# Patient Record
Sex: Male | Born: 1980
Health system: Southern US, Community
[De-identification: ages and names within clinical notes are randomized; demographics above are authoritative.]

## PROBLEM LIST (undated history)

## (undated) DIAGNOSIS — R569 Unspecified convulsions: Secondary | ICD-10-CM

## (undated) DIAGNOSIS — J45909 Unspecified asthma, uncomplicated: Secondary | ICD-10-CM

## (undated) DIAGNOSIS — F1920 Other psychoactive substance dependence, uncomplicated: Secondary | ICD-10-CM

## (undated) DIAGNOSIS — B1920 Unspecified viral hepatitis C without hepatic coma: Secondary | ICD-10-CM

## (undated) DIAGNOSIS — Z8619 Personal history of other infectious and parasitic diseases: Secondary | ICD-10-CM

## (undated) DIAGNOSIS — F32A Depression, unspecified: Secondary | ICD-10-CM

## (undated) DIAGNOSIS — F329 Major depressive disorder, single episode, unspecified: Secondary | ICD-10-CM

## (undated) DIAGNOSIS — I639 Cerebral infarction, unspecified: Secondary | ICD-10-CM

## (undated) DIAGNOSIS — F419 Anxiety disorder, unspecified: Secondary | ICD-10-CM

## (undated) HISTORY — PX: UMBILICAL HERNIA REPAIR: SHX196

## (undated) HISTORY — DX: Cerebral infarction, unspecified: I63.9

## (undated) HISTORY — DX: Personal history of other infectious and parasitic diseases: Z86.19

## (undated) HISTORY — PX: ABDOMINAL SURGERY: SHX537

## (undated) HISTORY — DX: Unspecified viral hepatitis C without hepatic coma: B19.20

## (undated) HISTORY — DX: Unspecified convulsions: R56.9

---

## 2013-06-07 ENCOUNTER — Emergency Department (HOSPITAL_BASED_OUTPATIENT_CLINIC_OR_DEPARTMENT_OTHER): Payer: Self-pay

## 2013-06-07 ENCOUNTER — Emergency Department (HOSPITAL_BASED_OUTPATIENT_CLINIC_OR_DEPARTMENT_OTHER)
Admission: EM | Admit: 2013-06-07 | Discharge: 2013-06-07 | Disposition: A | Discharge status: Home / Self Care | Payer: Self-pay | Attending: Emergency Medicine | Admitting: Emergency Medicine

## 2013-06-07 ENCOUNTER — Encounter (HOSPITAL_BASED_OUTPATIENT_CLINIC_OR_DEPARTMENT_OTHER): Payer: Self-pay | Admitting: *Deleted

## 2013-06-07 DIAGNOSIS — X500XXA Overexertion from strenuous movement or load, initial encounter: Secondary | ICD-10-CM | POA: Insufficient documentation

## 2013-06-07 DIAGNOSIS — S8991XA Unspecified injury of right lower leg, initial encounter: Secondary | ICD-10-CM

## 2013-06-07 DIAGNOSIS — Y9368 Activity, volleyball (beach) (court): Secondary | ICD-10-CM | POA: Insufficient documentation

## 2013-06-07 DIAGNOSIS — F17200 Nicotine dependence, unspecified, uncomplicated: Secondary | ICD-10-CM | POA: Insufficient documentation

## 2013-06-07 DIAGNOSIS — Y9239 Other specified sports and athletic area as the place of occurrence of the external cause: Secondary | ICD-10-CM | POA: Insufficient documentation

## 2013-06-07 DIAGNOSIS — S8990XA Unspecified injury of unspecified lower leg, initial encounter: Secondary | ICD-10-CM | POA: Insufficient documentation

## 2013-06-07 HISTORY — DX: Other psychoactive substance dependence, uncomplicated: F19.20

## 2013-06-07 NOTE — ED Notes (Signed)
Patient is from [facility] and is not to have any narcotics

## 2013-06-07 NOTE — ED Notes (Signed)
Patient was playing volley ball and and now his right knee is hurting.  Patient is able to bend his right knee.

## 2013-06-07 NOTE — ED Provider Notes (Signed)
CSN: [id number]     Arrival date & time 06/07/13  2144 History  This   Chief Complaint  Patient presents with  . Knee Pain    Patient is a 32 y.o. male presenting with knee pain. The history is provided by the patient. No language interpreter was used.  Knee Pain  HPI Comments: John [name] is a 32 y.o. male who presents to the Emergency Department complaining of right knee pain onset today after playing volleyball at [facility].  He twisted his right knee and has had pain with weight bearing and movement.  No other injury.    Past Medical History  Diagnosis Date  . Drug addiction    History reviewed. No pertinent past surgical history. History reviewed. No pertinent family history. History  Substance Use Topics  . Smoking status: Current Every Day Smoker    Types: Cigarettes  . Smokeless tobacco: Not on file  . Alcohol Use: No    Review of Systems  Musculoskeletal: Positive for arthralgias (right knee).  All other systems reviewed and are negative.    Allergies  Risperdal  Home Medications  No current outpatient prescriptions on file.  Triage Vitals: BP 137/74  Pulse 96  Temp(Src) 98.3 F (36.8 C) (Oral)  Resp 18  Ht 6' (1.829 m)  Wt 215 lb (97.523 kg)  BMI 29.15 kg/m2  SpO2 98%  Physical Exam  Nursing note and vitals reviewed. Constitutional: He is oriented to person, place, and time. He appears well-developed and well-nourished. No distress.  HENT:  Head: Normocephalic and atraumatic.  Eyes: EOM are normal.  Neck: Neck supple. No tracheal deviation present.  Cardiovascular: Normal rate.   Pulmonary/Chest: Effort normal. No respiratory distress.  Musculoskeletal: Normal range of motion.       Right knee: Normal. He exhibits normal range of motion, no swelling, no effusion, no deformity, no LCL laxity and no MCL laxity. No MCL, no LCL and no patellar tendon tenderness noted.  Neurological: He is alert and oriented to person, place, and time. He has normal  reflexes.  Skin: Skin is warm and dry.  Psychiatric: He has a normal mood and affect. His behavior is normal.    ED Course  Procedures (including critical care time)  DIAGNOSTIC STUDIES: Oxygen Saturation is 98% on RA, normal by my interpretation.    COORDINATION OF CARE: 10:21 PM-Discussed treatment plan which includes right knee xray with pt at bedside and pt agreed to plan.   Labs Review Labs Reviewed - No data to display  Imaging Review No results found.  MDM  No diagnosis found. No results found for this or any previous visit. Dg Knee Complete 4 Views Right  06/07/2013   CLINICAL DATA:  Right knee pain. Injury.  EXAM: RIGHT KNEE - COMPLETE 4+ VIEW  COMPARISON:  None.  FINDINGS: There is no evidence of fracture, dislocation, or joint effusion. There is no evidence of arthropathy or other focal bone abnormality. Soft tissues are unremarkable.  IMPRESSION: Negative.   Electronically Signed   By: Drusilla Kanner M.D.   On: 06/07/2013 23:01    John Quarry, MD 06/07/13 2312

## 2013-07-02 ENCOUNTER — Encounter (HOSPITAL_BASED_OUTPATIENT_CLINIC_OR_DEPARTMENT_OTHER): Payer: Self-pay | Admitting: Emergency Medicine

## 2013-07-02 ENCOUNTER — Emergency Department (HOSPITAL_BASED_OUTPATIENT_CLINIC_OR_DEPARTMENT_OTHER)
Admission: EM | Admit: 2013-07-02 | Discharge: 2013-07-02 | Disposition: A | Discharge status: Home / Self Care | Payer: Self-pay | Attending: Emergency Medicine | Admitting: Emergency Medicine

## 2013-07-02 DIAGNOSIS — Y9368 Activity, volleyball (beach) (court): Secondary | ICD-10-CM | POA: Insufficient documentation

## 2013-07-02 DIAGNOSIS — S0083XA Contusion of other part of head, initial encounter: Secondary | ICD-10-CM

## 2013-07-02 DIAGNOSIS — F17200 Nicotine dependence, unspecified, uncomplicated: Secondary | ICD-10-CM | POA: Insufficient documentation

## 2013-07-02 DIAGNOSIS — J45909 Unspecified asthma, uncomplicated: Secondary | ICD-10-CM | POA: Insufficient documentation

## 2013-07-02 DIAGNOSIS — Y9239 Other specified sports and athletic area as the place of occurrence of the external cause: Secondary | ICD-10-CM | POA: Insufficient documentation

## 2013-07-02 DIAGNOSIS — W1809XA Striking against other object with subsequent fall, initial encounter: Secondary | ICD-10-CM | POA: Insufficient documentation

## 2013-07-02 DIAGNOSIS — Z8659 Personal history of other mental and behavioral disorders: Secondary | ICD-10-CM | POA: Insufficient documentation

## 2013-07-02 DIAGNOSIS — Z79899 Other long term (current) drug therapy: Secondary | ICD-10-CM | POA: Insufficient documentation

## 2013-07-02 DIAGNOSIS — S0003XA Contusion of scalp, initial encounter: Secondary | ICD-10-CM | POA: Insufficient documentation

## 2013-07-02 HISTORY — DX: Unspecified asthma, uncomplicated: J45.909

## 2013-07-02 NOTE — ED Provider Notes (Signed)
CSN: [id number]     Arrival date & time 07/02/13  1646 History   First MD Initiated Contact with Patient 07/02/13 1730     Chief Complaint  Patient presents with  . Facial Pain   (Consider location/radiation/quality/duration/timing/severity/associated sxs/prior Treatment) HPI Pt who is a long time resident at [facility] reports he fell while playing volleyball hitting his L face on the sand. Denies LOC, complaining of some dizziness initially but improved since and now asymptomatic. Sent for evaluation.   Past Medical History  Diagnosis Date  . Drug addiction   . Asthma    History reviewed. No pertinent past surgical history. History reviewed. No pertinent family history. History  Substance Use Topics  . Smoking status: Current Every Day Smoker -- 2.00 packs/day    Types: Cigarettes  . Smokeless tobacco: Not on file  . Alcohol Use: No    Review of Systems All other systems reviewed and are negative except as noted in HPI.   Allergies  Risperdal  Home Medications   Current Outpatient Rx  Name  Route  Sig  Dispense  Refill  . DULoxetine (CYMBALTA) 60 MG capsule   Oral   Take 60 mg by mouth daily.         Marland Kitchen oxcarbazepine (TRILEPTAL) 600 MG tablet   Oral   Take 600 mg by mouth 2 (two) times daily.         . QUEtiapine (SEROQUEL) 400 MG tablet   Oral   Take 400 mg by mouth at bedtime.          BP 132/86  Temp(Src) 98.4 F (36.9 C) (Oral)  SpO2 96% Physical Exam  Nursing note and vitals reviewed. Constitutional: He is oriented to person, place, and time. He appears well-developed and well-nourished.  HENT:  Head: Normocephalic and atraumatic.  No outward signs of trauma, no swelling, ecchymosis or concern for facial fracture  Eyes: EOM are normal. Pupils are equal, round, and reactive to light.  No FB  Neck: Normal range of motion. Neck supple.  Cardiovascular: Normal rate, normal heart sounds and intact distal pulses.   Pulmonary/Chest: Effort normal and  breath sounds normal.  Abdominal: Bowel sounds are normal. He exhibits no distension. There is no tenderness.  Musculoskeletal: Normal range of motion. He exhibits no edema and no tenderness.  Neurological: He is alert and oriented to person, place, and time. He has normal strength. No cranial nerve deficit or sensory deficit.  Skin: Skin is warm and dry. No rash noted.  Psychiatric: He has a normal mood and affect.    ED Course  Procedures (including critical care time) Labs Review Labs Reviewed - No data to display Imaging Review No results found.  EKG Interpretation   None       MDM   1. Contusion of face, initial encounter    Normal exam, no concern for significant injury. Cleared to return to [facility]. Bernette Mayers, MD 07/02/13 1810

## 2013-07-02 NOTE — ED Notes (Signed)
Pt states that while playing vollyball he dove into the sand hitting the left side of his face. Pt reports being dizzy

## 2013-08-10 ENCOUNTER — Emergency Department: Payer: Self-pay

## 2013-08-10 LAB — CBC WITH DIFFERENTIAL/PLATELET
Basophil #: 0 10*3/uL (ref 0.0–0.1)
Eosinophil %: 2 %
HCT: 41.6 % (ref 40.0–52.0)
HGB: 14.5 g/dL (ref 13.0–18.0)
MCH: 32.8 pg (ref 26.0–34.0)
MCHC: 34.8 g/dL (ref 32.0–36.0)
MCV: 94 fL (ref 80–100)
Monocyte #: 0.6 x10 3/mm (ref 0.2–1.0)
Monocyte %: 6.9 %
Neutrophil #: 5 10*3/uL (ref 1.4–6.5)
Neutrophil %: 61.7 %
Platelet: 183 10*3/uL (ref 150–440)
RDW: 12.8 % (ref 11.5–14.5)

## 2013-08-10 LAB — COMPREHENSIVE METABOLIC PANEL
Albumin: 4.2 g/dL (ref 3.4–5.0)
Alkaline Phosphatase: 105 U/L
Anion Gap: 4 — ABNORMAL LOW (ref 7–16)
BUN: 11 mg/dL (ref 7–18)
Calcium, Total: 8.8 mg/dL (ref 8.5–10.1)
Chloride: 106 mmol/L (ref 98–107)
Co2: 27 mmol/L (ref 21–32)
Creatinine: 0.97 mg/dL (ref 0.60–1.30)
EGFR (African American): >60
EGFR (Non-African Amer.): >60
Potassium: 4 mmol/L (ref 3.5–5.1)

## 2013-08-10 LAB — CK TOTAL AND CKMB (NOT AT ARMC): CK-MB: 2.4 ng/mL (ref 0.5–3.6)

## 2013-10-13 DIAGNOSIS — B1920 Unspecified viral hepatitis C without hepatic coma: Secondary | ICD-10-CM | POA: Insufficient documentation

## 2014-01-14 ENCOUNTER — Emergency Department: Payer: Self-pay | Admitting: Internal Medicine

## 2014-08-06 ENCOUNTER — Emergency Department (HOSPITAL_COMMUNITY)
Admission: EM | Admit: 2014-08-06 | Discharge: 2014-08-06 | Disposition: A | Discharge status: Home / Self Care | Payer: Self-pay | Attending: Emergency Medicine | Admitting: Emergency Medicine

## 2014-08-06 ENCOUNTER — Encounter (HOSPITAL_COMMUNITY): Payer: Self-pay | Admitting: Emergency Medicine

## 2014-08-06 DIAGNOSIS — L03114 Cellulitis of left upper limb: Secondary | ICD-10-CM | POA: Insufficient documentation

## 2014-08-06 DIAGNOSIS — F329 Major depressive disorder, single episode, unspecified: Secondary | ICD-10-CM | POA: Insufficient documentation

## 2014-08-06 DIAGNOSIS — Z720 Tobacco use: Secondary | ICD-10-CM | POA: Insufficient documentation

## 2014-08-06 DIAGNOSIS — J45909 Unspecified asthma, uncomplicated: Secondary | ICD-10-CM | POA: Insufficient documentation

## 2014-08-06 DIAGNOSIS — F1990 Other psychoactive substance use, unspecified, uncomplicated: Secondary | ICD-10-CM

## 2014-08-06 DIAGNOSIS — Z79899 Other long term (current) drug therapy: Secondary | ICD-10-CM | POA: Insufficient documentation

## 2014-08-06 DIAGNOSIS — F1510 Other stimulant abuse, uncomplicated: Secondary | ICD-10-CM | POA: Insufficient documentation

## 2014-08-06 DIAGNOSIS — F1120 Opioid dependence, uncomplicated: Secondary | ICD-10-CM | POA: Insufficient documentation

## 2014-08-06 DIAGNOSIS — F11220 Opioid dependence with intoxication, uncomplicated: Secondary | ICD-10-CM

## 2014-08-06 HISTORY — DX: Depression, unspecified: F32.A

## 2014-08-06 HISTORY — DX: Major depressive disorder, single episode, unspecified: F32.9

## 2014-08-06 LAB — ETHANOL: Alcohol, Ethyl (B): <11 mg/dL (ref 0–11)

## 2014-08-06 LAB — CBC
HCT: 38.2 % — ABNORMAL LOW (ref 39.0–52.0)
HEMOGLOBIN: 13 g/dL (ref 13.0–17.0)
MCH: 30.6 pg (ref 26.0–34.0)
MCHC: 34 g/dL (ref 30.0–36.0)
MCV: 89.9 fL (ref 78.0–100.0)
PLATELETS: 274 10*3/uL (ref 150–400)
RBC: 4.25 MIL/uL (ref 4.22–5.81)
RDW: 12.4 % (ref 11.5–15.5)
WBC: 10.5 10*3/uL (ref 4.0–10.5)

## 2014-08-06 LAB — RAPID URINE DRUG SCREEN, HOSP PERFORMED
AMPHETAMINES: POSITIVE — AB
Barbiturates: NOT DETECTED
Benzodiazepines: NOT DETECTED
COCAINE: NOT DETECTED
Opiates: NOT DETECTED
TETRAHYDROCANNABINOL: NOT DETECTED

## 2014-08-06 LAB — COMPREHENSIVE METABOLIC PANEL
ALK PHOS: 79 U/L (ref 39–117)
ALT: 12 U/L (ref 0–53)
AST: 17 U/L (ref 0–37)
Albumin: 3.3 g/dL — ABNORMAL LOW (ref 3.5–5.2)
Anion gap: 16 — ABNORMAL HIGH (ref 5–15)
BUN: 11 mg/dL (ref 6–23)
CO2: 24 mEq/L (ref 19–32)
Calcium: 9 mg/dL (ref 8.4–10.5)
Chloride: 92 mEq/L — ABNORMAL LOW (ref 96–112)
Creatinine, Ser: 0.85 mg/dL (ref 0.50–1.35)
GFR calc non Af Amer: >90 mL/min (ref >90)
GLUCOSE: 120 mg/dL — AB (ref 70–99)
POTASSIUM: 3.8 meq/L (ref 3.7–5.3)
Sodium: 132 mEq/L — ABNORMAL LOW (ref 137–147)
TOTAL PROTEIN: 7.1 g/dL (ref 6.0–8.3)
Total Bilirubin: 0.3 mg/dL (ref 0.3–1.2)

## 2014-08-06 LAB — SALICYLATE LEVEL

## 2014-08-06 LAB — ACETAMINOPHEN LEVEL

## 2014-08-06 MED ORDER — ONDANSETRON 4 MG PO TBDP: 4.0000 mg | ORAL_TABLET | Freq: Four times a day (QID) | ORAL | Status: DC | PRN

## 2014-08-06 MED ORDER — DICYCLOMINE HCL 20 MG PO TABS: 20.0000 mg | ORAL_TABLET | Freq: Four times a day (QID) | ORAL | Status: DC | PRN

## 2014-08-06 MED ORDER — CLONIDINE HCL 0.1 MG PO TABS: 0.1000 mg | ORAL_TABLET | Freq: Four times a day (QID) | ORAL | Status: DC

## 2014-08-06 MED ORDER — SULFAMETHOXAZOLE-TRIMETHOPRIM 800-160 MG PO TABS
1.0000 | ORAL_TABLET | Freq: Once | ORAL | Status: AC
  Administered 2014-08-06: 1 via ORAL
  Filled 2014-08-06: qty 1

## 2014-08-06 MED ORDER — NAPROXEN 500 MG PO TABS: 500.0000 mg | ORAL_TABLET | Freq: Two times a day (BID) | ORAL | Status: DC | PRN

## 2014-08-06 MED ORDER — HYDROXYZINE HCL 25 MG PO TABS
25.0000 mg | ORAL_TABLET | Freq: Four times a day (QID) | ORAL | Status: DC | PRN
  Administered 2014-08-06: 25 mg via ORAL
  Filled 2014-08-06: qty 1

## 2014-08-06 MED ORDER — CLONIDINE HCL 0.1 MG PO TABS: 0.1000 mg | ORAL_TABLET | Freq: Every day | ORAL | Status: DC

## 2014-08-06 MED ORDER — SULFAMETHOXAZOLE-TRIMETHOPRIM 800-160 MG PO TABS: 1.0000 | ORAL_TABLET | Freq: Two times a day (BID) | ORAL | Status: DC

## 2014-08-06 MED ORDER — HYDROXYZINE HCL 25 MG PO TABS: 25.0000 mg | ORAL_TABLET | Freq: Four times a day (QID) | ORAL | Status: DC | PRN

## 2014-08-06 MED ORDER — METHOCARBAMOL 500 MG PO TABS: 500.0000 mg | ORAL_TABLET | Freq: Three times a day (TID) | ORAL | Status: DC | PRN

## 2014-08-06 MED ORDER — CLONIDINE HCL 0.1 MG PO TABS: 0.1000 mg | ORAL_TABLET | ORAL | Status: DC

## 2014-08-06 MED ORDER — LOPERAMIDE HCL 2 MG PO CAPS: 2.0000 mg | ORAL_CAPSULE | ORAL | Status: DC | PRN

## 2014-08-06 NOTE — ED Notes (Signed)
Pt states he is here for detox from opiates  Pt states he last used about 20 minutes ago  Pt states he takes Opana  Pt has infected tract marks noted to his hands and arms with possible abscess noted to his left wrist   Pt is shaky and speech is garbled  Pt states he also does meth, weed, heroin, and pain pills with occasional alcohol use

## 2014-08-06 NOTE — Discharge Instructions (Signed)
You have been evaluated in the emergency department today, and you are medically cleared.  You have a infection of the skin at the left wrist.  Take antibiotics as prescribed.  You were prescribed medications to help with withdrawal symptoms from opiates.  Please follow-up with the resource list given to you today.   Cellulitis Cellulitis is an infection of the skin and the tissue beneath it. The infected area is usually red and tender. Cellulitis occurs most often in the arms and lower legs.  CAUSES  Cellulitis is caused by bacteria that enter the skin through cracks or cuts in the skin. The most common types of bacteria that cause cellulitis are staphylococci and streptococci. SIGNS AND SYMPTOMS   Redness and warmth.  Swelling.  Tenderness or pain.  Fever. DIAGNOSIS  Your health care provider can usually determine what is wrong based on a physical exam. Blood tests may also be done. TREATMENT  Treatment usually involves taking an antibiotic medicine. HOME CARE INSTRUCTIONS   Take your antibiotic medicine as directed by your health care provider. Finish the antibiotic even if you start to feel better.  Keep the infected arm or leg elevated to reduce swelling.  Apply a warm cloth to the affected area up to 4 times per day to relieve pain.  Take medicines only as directed by your health care provider.  Keep all follow-up visits as directed by your health care provider. SEEK MEDICAL CARE IF:   You notice red streaks coming from the infected area.  Your red area gets larger or turns dark in color.  Your bone or joint underneath the infected area becomes painful after the skin has healed.  Your infection returns in the same area or another area.  You notice a swollen bump in the infected area.  You develop new symptoms.  You have a fever. SEEK IMMEDIATE MEDICAL CARE IF:   You feel very sleepy.  You develop vomiting or diarrhea.  You have a general ill feeling (malaise)  with muscle aches and pains. MAKE SURE YOU:   Understand these instructions.  Will watch your condition.  Will get help right away if you are not doing well or get worse. Document Released: 06/13/2005 Document Revised: 01/18/2014 Document Reviewed: 11/19/2011 [facility] Patient Information 2015 [place], [place]. This information is not intended to replace advice given to you by your health care provider. Make sure you discuss any questions you have with your health care provider.  Opioid Use Disorder Opioid use disorder is a mental disorder. It is the continued nonmedical use of opioids in spite of risks to health and well-being. Misused opioids include the street drug heroin. They also include pain medicines such as morphine, hydrocodone, oxycodone, and fentanyl. Opioids are very addictive. People who misuse opioids get an exaggerated feeling of well-being. Opioid use disorder often disrupts activities at home, work, or school. It may cause mental or physical problems.  A family history of opioid use disorder puts you at higher risk of it. People with opioid use disorder often misuse other drugs or have mental illness such as depression, posttraumatic stress disorder, or antisocial personality disorder. They also are at risk of suicide and death from overdose. SIGNS AND SYMPTOMS  Signs and symptoms of opioid use disorder include:  Use of opioids in larger amounts or over a longer period than intended.  Unsuccessful attempts to cut down or control opioid use.  A lot of time spent obtaining, using, or recovering from the effects of opioids.  A strong desire or urge to use opioids (craving).  Continued use of opioids in spite of major problems at work, school, or home because of use.  Continued use of opioids in spite of relationship problems because of use.  Giving up or cutting down on important life activities because of opioid use.  Use of opioids over and over in situations when it is  physically hazardous, such as driving a car.  Continued use of opioids in spite of a physical problem that is likely related to use. Physical problems can include:  Severe constipation.  Poor nutrition.  Infertility.  Tuberculosis.  Aspiration pneumonia.  Infections such as human immunodeficiency virus (HIV) and hepatitis (from injecting opioids).  Continued use of opioids in spite of a mental problem that is likely related to use. Mental problems can include:  Depression.  Anxiety.  Hallucinations.  Sleep problems.  Loss of sexual function.  Need to use more and more opioids to get the same effect, or lessened effect over time with use of the same amount (tolerance).  Having withdrawal symptoms when opioid use is stopped, or using opioids to reduce or avoid withdrawal symptoms. Withdrawal symptoms include:  Depressed, anxious, or irritable mood.  Nausea, vomiting, diarrhea, or intestinal cramping.  Muscle aches or spasms.  Excessive tearing or runny nose.  Dilated pupils, sweating, or hairs standing on end.  Yawning.  Fever, raised blood pressure, or fast pulse.  Restlessness or trouble sleeping. This does not apply to people taking opioids for medical reasons only. DIAGNOSIS Opioid use disorder is diagnosed by your health care provider. You may be asked questions about your opioid use and and how it affects your life. A physical exam may be done. A drug screen may be ordered. You may be referred to a mental health professional. The diagnosis of opioid use disorder requires at least two symptoms within 12 months. The type of opioid use disorder you have depends on the number of signs and symptoms you have. The type may be:  Mild. Two or three signs and symptoms.   Moderate. Four or five signs and symptoms.   Severe. Six or more signs and symptoms. TREATMENT  Treatment is usually provided by mental health professionals with training in substance use  disorders.The following options are available:  Detoxification.This is the first step in treatment for withdrawal. It is medically supervised withdrawal with the use of medicines. These medicines lessen withdrawal symptoms. They also raise the chance of becoming opioid free.  Counseling, also known as talk therapy. Talk therapy addresses the reasons you use opioids. It also addresses ways to keep you from using again (relapse). The goals of talk therapy are to avoid relapse by:  Identifying and avoiding triggers for use.  Finding healthy ways to cope with stress.  Learning how to handle cravings.  Support groups. Support groups provide emotional support, advice, and guidance.  A medicine that blocks opioid receptors in your brain. This medicine can reduce opioid cravings that lead to relapse. This medicine also blocks the desired opioid effect when relapse occurs.  Opioids that are taken by mouth in place of the misused opioid (opioid maintenance treatment). These medicines satisfy cravings but are safer than commonly misused opioids. This often is the best option for people who continue to relapse with other treatments. HOME CARE INSTRUCTIONS   Take medicines only as directed by your health care provider.  Check with your health care provider before starting new medicines.  Keep all follow-up visits as  directed by your health care provider. SEEK MEDICAL CARE IF:  You are not able to take your medicines as directed.  Your symptoms get worse. SEEK IMMEDIATE MEDICAL CARE IF:  You have serious thoughts about hurting yourself or others.  You may have taken an overdose of opioids. FOR MORE INFORMATION  National Institute on Drug Abuse: http://www.price-smith.com/www.drugabuse.gov  Substance Abuse and Mental Health Services Administration: Sk[employer].com.ptwww.samhsa.gov Document Released: 07/01/2007 Document Revised: 01/18/2014 Document Reviewed: 09/16/2013 [facility] Patient Information 2015 [place], [place]. This  information is not intended to replace advice given to you by your health care provider. Make sure you discuss any questions you have with your health care provider.  Polysubstance Abuse When people abuse more than one drug or type of drug it is called polysubstance or polydrug abuse. For example, many smokers also drink alcohol. This is one form of polydrug abuse. Polydrug abuse also refers to the use of a drug to counteract an unpleasant effect produced by another drug. It may also be used to help with withdrawal from another drug. People who take stimulants may become agitated. Sometimes this agitation is countered with a tranquilizer. This helps protect against the unpleasant side effects. Polydrug abuse also refers to the use of different drugs at the same time.  Anytime drug use is interfering with normal living activities, it has become abuse. This includes problems with family and friends. Psychological dependence has developed when your mind tells you that the drug is needed. This is usually followed by physical dependence which has developed when continuing increases of drug are required to get the same feeling or "high". This is known as addiction or chemical dependency. A person's risk is much higher if there is a history of chemical dependency in the family. SIGNS OF CHEMICAL DEPENDENCY  You have been told by friends or family that drugs have become a problem.  You fight when using drugs.  You are having blackouts (not remembering what you do while using).  You feel sick from using drugs but continue using.  You lie about use or amounts of drugs (chemicals) used.  You need chemicals to get you going.  You are suffering in work performance or in school because of drug use.  You get sick from use of drugs but continue to use anyway.  You need drugs to relate to people or feel comfortable in social situations.  You use drugs to forget problems. "Yes" answered to any of the above  signs of chemical dependency indicates there are problems. The longer the use of drugs continues, the greater the problems will become. If there is a family history of drug or alcohol use, it is best not to experiment with these drugs. Continual use leads to tolerance. After tolerance develops more of the drug is needed to get the same feeling. This is followed by addiction. With addiction, drugs become the most important part of life. It becomes more important to take drugs than participate in the other usual activities of life. This includes relating to friends and family. Addiction is followed by dependency. Dependency is a condition where drugs are now needed not just to get high, but to feel normal. Addiction cannot be cured but it can be stopped. This often requires outside help and the care of professionals. Treatment centers are listed in the yellow pages under: Cocaine, Narcotics, and Alcoholics Anonymous. Most hospitals and clinics can refer you to a specialized care center. Talk to your caregiver if you need help. Document Released: 04/25/2005  Document Revised: 11/26/2011 Document Reviewed: 09/03/2005 [facility] Patient Information 2015 [place], [place]. This information is not intended to replace advice given to you by your health care provider. Make sure you discuss any questions you have with your health care provider.

## 2014-08-06 NOTE — BH Assessment (Signed)
Tele Assessment Note   Artis FlockRobert Magid is a 33 y.o. male who voluntarily presents for opiate and methamphetamine detox.  Pt denies SI/HI/AVH. Pt reports he uses 4-8 40mg  opana pills and uses a "20 bag" of heroin, daily. His last use was at 2am this morning.  Pt also uses 1 gram of methamphetamine, daily, his last use was 08/05/14. He used a "20 bag".  He has visible track marks on bilateral arms/hands and infections at both sites. Pt is c/o w/d sxs: visible tremors, irritability, muscle tightening, stomach cramps, body aches, hot/cold and constipation. Pt also has involuntary movements during interview and has poor eye contact due to sensitivity to light and sound.  Pt states he was sober for 10 mos, "got depressed" because of family issues and death in the family.  Pt has withdrawal seizures, last seizure activity was 2 yrs ago and he also blackouts.  Pt has legal issues--court date on 09/03/14 for larceny([facility] he was also charged again for [facility] again and charged with B&E, Trespassing and [employer] Larceny.  [facility] and then returned 08/05/14.    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 Drug addiction   . Asthma   . Depression    Axis IV: other psychosocial or environmental problems, problems related to legal system/crime, problems related to social environment and problems with primary support group Axis V: 41-50 serious symptoms  Past Medical History:  Past Medical History  Diagnosis Date  . Drug addiction   . Asthma   . Depression     Past Surgical History  Procedure Laterality Date  . Abdominal surgery      Family History: History reviewed. No pertinent family history.  Social History:  reports that he has been smoking Cigarettes.  He  has been smoking about 2.00 packs per day. He does not have any smokeless tobacco history on file. He reports that he drinks alcohol. He reports that he uses illicit drugs (Marijuana, Methamphetamines, and Heroin).  Additional Social History:  Alcohol / Drug Use Pain Medications: See MAR  Prescriptions: See MAR  Over the Counter: See MAR  History of alcohol / drug use?: Yes Longest period of sobriety (when/how long): When in detox  Negative Consequences of Use: Work / Programmer, multimediachool, Copywriter, advertisingersonal relationships, Armed forces operational officerLegal, Surveyor, quantityinancial Withdrawal Symptoms: Cramps, Tremors, Seizures, Fever / Chills, Sweats, Irritability, Agitation Onset of Seizures: Withdrawals  Date of most recent seizure: 2 yrs ago  Substance #1 Name of Substance 1: Opiates--heroin/opana  1 - Age of First Use: 24 YOM  1 - Amount (size/oz): 4-8 40mg  1 - Frequency: Daily  1 - Duration: On-going  1 - Last Use / Amount: 08/05/14 Substance #2 Name of Substance 2: Methamphetamine  2 - Age of First Use: 28 YOM  2 - Amount (size/oz): 1 Gram  2 - Frequency: Daily  2 - Duration: On-going 2 - Last Use / Amount: 08/04/14  CIWA: CIWA-Ar BP: 120/70 mmHg Pulse Rate: 96 COWS:    PATIENT STRENGTHS: (choose at least two) Supportive family/friends  Allergies:  Allergies  Allergen Reactions  . Risperdal [Risperidone]     dyskinesia     Home Medications:  (Not in a hospital admission)  OB/GYN Status:  No LMP for male patient.  General Assessment Data Location of Assessment: [facility] Is  this a Tele or Face-to-Face Assessment?: Tele Assessment Is this an Initial Assessment or a Re-assessment for this encounter?: Initial Assessment Living Arrangements: Alone Can pt return to current living arrangement?: Yes Admission Status: Voluntary Is patient capable of signing voluntary admission?: Yes Transfer from: Home Referral Source: Self/Family/Friend  Medical Screening Exam [facility] Walk-in ONLY) Medical Exam completed: No Reason for MSE not  completed: Other: (None )  [facility] Living Arrangements: Alone Name of Psychiatrist: None  Name of Therapist: None   Education Status Is patient currently in school?: No Current Grade: None  Highest grade of school patient has completed: None  Name of school: None  Contact person: None   Risk to self with the past 6 months Suicidal Ideation: No Suicidal Intent: No Is patient at risk for suicide?: No Suicidal Plan?: No Access to Means: No What has been your use of drugs/alcohol within the last 12 months?: Abusing: heroin, opana, methamphetamine  Previous Attempts/Gestures: No How many times?: 0 Other Self Harm Risks: None  Triggers for Past Attempts: None known Intentional Self Injurious Behavior: None Family Suicide History: No Recent stressful life event(s): Other (Comment), Legal Issues (Chronic SA) Persecutory voices/beliefs?: No Depression: Yes Depression Symptoms: Feeling angry/irritable Substance abuse history and/or treatment for substance abuse?: Yes Suicide prevention information given to non-admitted patients: Not applicable  Risk to Others within the past 6 months Homicidal Ideation: No Thoughts of Harm to Others: No Current Homicidal Intent: No Current Homicidal Plan: No Access to Homicidal Means: No Identified Victim: None  History of harm to others?: No Assessment of Violence: None Noted Does patient have access to weapons?: No Criminal Charges Pending?: Yes Describe Pending Criminal Charges: Carolee RotaLarceny, Felony Larceny, B&E, Trepassing  Does patient have a court date: Yes Court Date: 09/03/14  Psychosis Hallucinations: None noted Delusions: None noted  Mental Status Report Appear/Hygiene: Disheveled Eye Contact: Poor Motor Activity: Unremarkable Speech: Logical/coherent, Slurred Level of Consciousness: Alert, Irritable Mood: Irritable Affect: Irritable Anxiety Level: None Thought Processes: Coherent, Relevant Judgement:  Unimpaired Orientation: Person, Place, Time, Situation Obsessive Compulsive Thoughts/Behaviors: None  Cognitive Functioning Concentration: Decreased Memory: Recent Intact, Remote Intact IQ: Average Insight: Fair Impulse Control: Fair Appetite: Fair Weight Loss: 0 Weight Gain: 0 Sleep: No Change Total Hours of Sleep:  (No sleep x4 days ) Vegetative Symptoms: None  ADLScreening [facility] Assessment Services) Patient's cognitive ability adequate to safely complete daily activities?: Yes Patient able to express need for assistance with ADLs?: Yes Independently performs ADLs?: Yes (appropriate for developmental age)  Prior Inpatient Therapy Prior Inpatient Therapy: Yes Prior Therapy Dates: 2013,2010 Prior Therapy Facilty/Provider(s): [facility], [facility]  Reason for Treatment: Detox/Rehab   Prior Outpatient Therapy Prior Outpatient Therapy: No Prior Therapy Dates: None  Prior Therapy Facilty/Provider(s): None  Reason for Treatment: None   ADL Screening (condition at time of admission) Patient's cognitive ability adequate to safely complete daily activities?: Yes Is the patient deaf or have difficulty hearing?: No Does the patient have difficulty seeing, even when wearing glasses/contacts?: No Does the patient have difficulty concentrating, remembering, or making decisions?: Yes Patient able to express need for assistance with ADLs?: Yes Does the patient have difficulty dressing or bathing?: No Independently performs ADLs?: Yes (appropriate for developmental age) Does the patient have difficulty walking or climbing stairs?: No Weakness of Legs: None Weakness of Arms/Hands: None  Home Assistive Devices/Equipment Home Assistive Devices/Equipment: None  Therapy Consults (therapy consults require a physician order) PT Evaluation Needed: No OT Evalulation Needed: No SLP Evaluation Needed:  No Abuse/Neglect Assessment (Assessment to be complete while patient is alone) Physical  Abuse: Denies Verbal Abuse: Denies Sexual Abuse: Denies Exploitation of patient/patient's resources: Denies Self-Neglect: Denies Values / Beliefs Cultural Requests During Hospitalization: None Spiritual Requests During Hospitalization: None Consults Spiritual Care Consult Needed: No Social Work Consult Needed: No Merchant navy officer (For Healthcare) Does patient have an advance directive?: No Would patient like information on creating an advanced directive?: No - patient declined information Nutrition Screen- [facility] Patient's home diet: Regular  Additional Information 1:1 In Past 12 Months?: No CIRT Risk: No Elopement Risk: No Does patient have medical clearance?: Yes     Disposition:  Disposition Initial Assessment Completed for this Encounter: Yes Disposition of Patient: Outpatient treatment, Referred to (Referrals provided for outpatient services ) Type of outpatient treatment: Adult Patient referred to: Outpatient clinic referral (Outpatient referrals provided )  Murrell Redden 08/06/2014 6:08 AM

## 2014-08-06 NOTE — ED Notes (Signed)
Telepsych started

## 2014-08-06 NOTE — ED Notes (Signed)
Pt finished with telepsych  Pt upset in hallway cursing and agitated  Pt went to restroom came back picked his belongings up and went out the door  Attempted to talk to pt but he continued walking out  Visitor in room went out saying she was going to try to talk him into coming back for further testing and evaluation

## 2014-08-06 NOTE — ED Provider Notes (Signed)
CSN: [id number]     Arrival date & time 08/06/14  0258 History   First MD Initiated Contact with Patient 08/06/14 [phone]     Chief Complaint  Patient presents with  . detox      (Consider location/radiation/quality/duration/timing/severity/associated sxs/prior Treatment) HPI 33 year old male presents to emergency department with request for help with detox.  Patient reports he is currently using Opana, heroin, marijuana, meth and pain pills.  He occasionally uses alcohol.  Patient prefers to use his drugs intravenously.  He was seen recently at outside hospital due to redness at his left wrist.  He reports that he was prescribed Keflex but he did not fill it.  He denies any fever, shortness of breath, chest pain or back pain.  Patient history of depression and anxiety.  He denies any SI or HI at this time. Past Medical History  Diagnosis Date  . Drug addiction   . Asthma   . Depression    Past Surgical History  Procedure Laterality Date  . Abdominal surgery     History reviewed. No pertinent family history. History  Substance Use Topics  . Smoking status: Current Every Day Smoker -- 2.00 packs/day    Types: Cigarettes  . Smokeless tobacco: Not on file  . Alcohol Use: Yes     Comment: occ    Review of Systems   See History of Present Illness; otherwise all other systems are reviewed and negative  Allergies  Risperdal  Home Medications   Prior to Admission medications   Medication Sig Start Date End Date Taking? Authorizing Provider  acetaminophen (TYLENOL) 500 MG tablet Take 1,000 mg by mouth every 6 (six) hours as needed for mild pain.   Yes Historical Provider, MD  QUEtiapine (SEROQUEL) 200 MG tablet Take 200 mg by mouth at bedtime.   Yes Historical Provider, MD  DULoxetine (CYMBALTA) 60 MG capsule Take 60 mg by mouth daily.    Historical Provider, MD  oxcarbazepine (TRILEPTAL) 600 MG tablet Take 600 mg by mouth 2 (two) times daily.    Historical Provider, MD   QUEtiapine (SEROQUEL) 400 MG tablet Take 400 mg by mouth at bedtime.    Historical Provider, MD   BP 120/70 mmHg  Pulse 96  Temp(Src) 98.3 F (36.8 C) (Oral)  Resp 20  SpO2 99% Physical Exam  Constitutional: He is oriented to person, place, and time. He appears well-developed and well-nourished.  HENT:  Head: Normocephalic and atraumatic.  Nose: Nose normal.  Mouth/Throat: Oropharynx is clear and moist.  Eyes: Conjunctivae and EOM are normal. Pupils are equal, round, and reactive to light.  Neck: Normal range of motion. Neck supple. No JVD present. No tracheal deviation present. No thyromegaly present.  Cardiovascular: Normal rate, regular rhythm, normal heart sounds and intact distal pulses.  Exam reveals no gallop and no friction rub.   No murmur heard. Pulmonary/Chest: Effort normal and breath sounds normal. No stridor. No respiratory distress. He has no wheezes. He has no rales. He exhibits no tenderness.  Abdominal: Soft. Bowel sounds are normal. He exhibits no distension and no mass. There is no tenderness. There is no rebound and no guarding.  Musculoskeletal: Normal range of motion. He exhibits no edema or tenderness.  Lymphadenopathy:    He has no cervical adenopathy.  Neurological: He is alert and oriented to person, place, and time. He displays normal reflexes. He exhibits normal muscle tone. Coordination normal.  Skin: Skin is warm and dry. No rash noted. No erythema. No pallor.  Patient with multiple areas of erythema and irritation secondary to IV drug use.  Old scarring noted to both ACs and forearms.  Patient has excoriations around the right thumb.  Right arm does not have any active infection.  Patient has an area of erythema to the left volar wrist.  It is not fluctuant.  Patient has normal range of motion at the wrist.  It is mildly tender to palpation.  It is warm to the touch.  Psychiatric: He has a normal mood and affect. His behavior is normal. Judgment and thought  content normal.  Nursing note and vitals reviewed.   ED Course  Procedures (including critical care time) Labs Review Labs Reviewed  CBC - Abnormal; Notable for the following:    HCT 38.2 (*)    All other components within normal limits  URINE RAPID DRUG SCREEN (HOSP PERFORMED) - Abnormal; Notable for the following:    Amphetamines POSITIVE (*)    All other components within normal limits  ACETAMINOPHEN LEVEL  COMPREHENSIVE METABOLIC PANEL  ETHANOL  SALICYLATE LEVEL    Imaging Review No results found.   EKG Interpretation None      MDM   Final diagnoses:  Polysubstance dependence including opioid type drug, episodic abuse, uncomplicated  Cellulitis of left forearm  Active intravenous drug use   33 year old male with polysubstance abuse, IV drug use who requests help with getting off opiates.  We do not have a opiate detox program, will talk with [facility], however given patient's overall state to see if they can lend help with resources.  Will start patient on Bactrim.  Bedside ultrasound performed, no signs of loculation or pus over the area of erythema to left wrist.  Olivia Mackielga M Donise Woodle, MD 08/06/14 [phone]

## 2014-08-06 NOTE — ED Notes (Signed)
Dr Norlene Campbelltter in and performed ultrasound to pt's arm

## 2014-08-06 NOTE — ED Notes (Signed)
MD at bedside. 

## 2014-08-06 NOTE — ED Notes (Signed)
Pt returned to room  EDP Otter notified of pt's return

## 2014-08-07 ENCOUNTER — Emergency Department: Payer: Self-pay | Admitting: Emergency Medicine

## 2014-08-07 LAB — ACETAMINOPHEN LEVEL

## 2014-08-07 LAB — COMPREHENSIVE METABOLIC PANEL
ALBUMIN: 3.3 g/dL — AB (ref 3.4–5.0)
ALK PHOS: 85 U/L
Anion Gap: 6 — ABNORMAL LOW (ref 7–16)
BILIRUBIN TOTAL: 0.2 mg/dL (ref 0.2–1.0)
BUN: 12 mg/dL (ref 7–18)
Calcium, Total: 8.3 mg/dL — ABNORMAL LOW (ref 8.5–10.1)
Chloride: 104 mmol/L (ref 98–107)
Co2: 28 mmol/L (ref 21–32)
Creatinine: 1.09 mg/dL (ref 0.60–1.30)
EGFR (African American): >60
EGFR (Non-African Amer.): >60
GLUCOSE: 101 mg/dL — AB (ref 65–99)
OSMOLALITY: 276 (ref 275–301)
POTASSIUM: 3.7 mmol/L (ref 3.5–5.1)
SGOT(AST): 24 U/L (ref 15–37)
SGPT (ALT): 20 U/L
Sodium: 138 mmol/L (ref 136–145)
TOTAL PROTEIN: 7.5 g/dL (ref 6.4–8.2)

## 2014-08-07 LAB — URINALYSIS, COMPLETE
BILIRUBIN, UR: NEGATIVE
Bacteria: NONE SEEN
Blood: NEGATIVE
Glucose,UR: NEGATIVE mg/dL (ref 0–75)
Ketone: NEGATIVE
Leukocyte Esterase: NEGATIVE
Nitrite: NEGATIVE
PH: 5 (ref 4.5–8.0)
Protein: NEGATIVE
RBC,UR: 1 /HPF (ref 0–5)
Specific Gravity: 1.025 (ref 1.003–1.030)
Squamous Epithelial: <1
WBC UR: 2 /HPF (ref 0–5)

## 2014-08-07 LAB — DRUG SCREEN, URINE
Amphetamines, Ur Screen: POSITIVE (ref ?–1000)
Barbiturates, Ur Screen: NEGATIVE (ref ?–200)
Benzodiazepine, Ur Scrn: NEGATIVE (ref ?–200)
COCAINE METABOLITE, UR ~~LOC~~: NEGATIVE (ref ?–300)
Cannabinoid 50 Ng, Ur ~~LOC~~: POSITIVE (ref ?–50)
MDMA (Ecstasy)Ur Screen: POSITIVE (ref ?–500)
METHADONE, UR SCREEN: NEGATIVE (ref ?–300)
Opiate, Ur Screen: POSITIVE (ref ?–300)
PHENCYCLIDINE (PCP) UR S: NEGATIVE (ref ?–25)
TRICYCLIC, UR SCREEN: NEGATIVE (ref ?–1000)

## 2014-08-07 LAB — CBC
HCT: 39.7 % — ABNORMAL LOW (ref 40.0–52.0)
HGB: 12.9 g/dL — ABNORMAL LOW (ref 13.0–18.0)
MCH: 30.2 pg (ref 26.0–34.0)
MCHC: 32.5 g/dL (ref 32.0–36.0)
MCV: 93 fL (ref 80–100)
PLATELETS: 297 10*3/uL (ref 150–440)
RBC: 4.27 10*6/uL — AB (ref 4.40–5.90)
RDW: 12.7 % (ref 11.5–14.5)
WBC: 10.4 10*3/uL (ref 3.8–10.6)

## 2014-08-07 LAB — ETHANOL: Ethanol: <3 mg/dL

## 2014-08-07 LAB — SALICYLATE LEVEL: Salicylates, Serum: 3.2 mg/dL — ABNORMAL HIGH

## 2016-02-14 DIAGNOSIS — B1920 Unspecified viral hepatitis C without hepatic coma: Secondary | ICD-10-CM

## 2016-03-20 ENCOUNTER — Encounter (HOSPITAL_COMMUNITY): Payer: Self-pay | Admitting: *Deleted

## 2016-03-20 ENCOUNTER — Inpatient Hospital Stay (HOSPITAL_COMMUNITY)
Admission: AD | Admit: 2016-03-20 | Discharge: 2016-03-26 | DRG: 885 | Disposition: A | Discharge status: Home / Self Care | Payer: Federal, State, Local not specified - Other | Source: Other Acute Inpatient Hospital | Attending: Emergency Medicine | Admitting: Emergency Medicine

## 2016-03-20 DIAGNOSIS — T434X5A Adverse effect of butyrophenone and thiothixene neuroleptics, initial encounter: Secondary | ICD-10-CM | POA: Diagnosis not present

## 2016-03-20 DIAGNOSIS — F411 Generalized anxiety disorder: Secondary | ICD-10-CM | POA: Diagnosis present

## 2016-03-20 DIAGNOSIS — G4700 Insomnia, unspecified: Secondary | ICD-10-CM | POA: Diagnosis present

## 2016-03-20 DIAGNOSIS — Z8673 Personal history of transient ischemic attack (TIA), and cerebral infarction without residual deficits: Secondary | ICD-10-CM

## 2016-03-20 DIAGNOSIS — J45909 Unspecified asthma, uncomplicated: Secondary | ICD-10-CM | POA: Diagnosis present

## 2016-03-20 DIAGNOSIS — Z79899 Other long term (current) drug therapy: Secondary | ICD-10-CM | POA: Diagnosis not present

## 2016-03-20 DIAGNOSIS — F259 Schizoaffective disorder, unspecified: Secondary | ICD-10-CM | POA: Diagnosis present

## 2016-03-20 DIAGNOSIS — F4310 Post-traumatic stress disorder, unspecified: Secondary | ICD-10-CM

## 2016-03-20 DIAGNOSIS — F17210 Nicotine dependence, cigarettes, uncomplicated: Secondary | ICD-10-CM | POA: Diagnosis present

## 2016-03-20 DIAGNOSIS — R45851 Suicidal ideations: Secondary | ICD-10-CM

## 2016-03-20 DIAGNOSIS — Z833 Family history of diabetes mellitus: Secondary | ICD-10-CM

## 2016-03-20 DIAGNOSIS — F1220 Cannabis dependence, uncomplicated: Secondary | ICD-10-CM

## 2016-03-20 DIAGNOSIS — F1420 Cocaine dependence, uncomplicated: Secondary | ICD-10-CM | POA: Diagnosis not present

## 2016-03-20 DIAGNOSIS — G2402 Drug induced acute dystonia: Secondary | ICD-10-CM | POA: Diagnosis present

## 2016-03-20 DIAGNOSIS — F1590 Other stimulant use, unspecified, uncomplicated: Secondary | ICD-10-CM | POA: Clinically undetermined

## 2016-03-20 DIAGNOSIS — F1120 Opioid dependence, uncomplicated: Secondary | ICD-10-CM

## 2016-03-20 DIAGNOSIS — F1020 Alcohol dependence, uncomplicated: Secondary | ICD-10-CM | POA: Diagnosis not present

## 2016-03-20 DIAGNOSIS — F1920 Other psychoactive substance dependence, uncomplicated: Secondary | ICD-10-CM | POA: Clinically undetermined

## 2016-03-20 DIAGNOSIS — F250 Schizoaffective disorder, bipolar type: Principal | ICD-10-CM

## 2016-03-20 DIAGNOSIS — T7840XA Allergy, unspecified, initial encounter: Secondary | ICD-10-CM

## 2016-03-20 DIAGNOSIS — T43505A Adverse effect of unspecified antipsychotics and neuroleptics, initial encounter: Secondary | ICD-10-CM

## 2016-03-20 DIAGNOSIS — Z818 Family history of other mental and behavioral disorders: Secondary | ICD-10-CM | POA: Diagnosis not present

## 2016-03-20 DIAGNOSIS — R45850 Homicidal ideations: Secondary | ICD-10-CM

## 2016-03-20 MED ORDER — HYDROXYZINE HCL 25 MG PO TABS
25.0000 mg | ORAL_TABLET | Freq: Four times a day (QID) | ORAL | Status: DC | PRN
  Administered 2016-03-20 – 2016-03-26 (×9): 25 mg via ORAL
  Filled 2016-03-20 (×8): qty 1
  Filled 2016-03-20: qty 10
  Filled 2016-03-20 (×2): qty 1

## 2016-03-20 MED ORDER — NICOTINE 21 MG/24HR TD PT24
21.0000 mg | MEDICATED_PATCH | Freq: Every day | TRANSDERMAL | Status: DC
  Administered 2016-03-20 – 2016-03-24 (×3): 21 mg via TRANSDERMAL
  Filled 2016-03-20 (×7): qty 1

## 2016-03-20 MED ORDER — QUETIAPINE FUMARATE 200 MG PO TABS
200.0000 mg | ORAL_TABLET | Freq: Every day | ORAL | Status: DC
  Administered 2016-03-20: 200 mg via ORAL
  Filled 2016-03-20 (×2): qty 1

## 2016-03-20 MED ORDER — CLONIDINE HCL 0.1 MG PO TABS
0.1000 mg | ORAL_TABLET | Freq: Four times a day (QID) | ORAL | Status: AC
  Administered 2016-03-20 – 2016-03-21 (×4): 0.1 mg via ORAL
  Filled 2016-03-20 (×7): qty 1

## 2016-03-20 MED ORDER — HYDROXYZINE HCL 25 MG PO TABS: 25.0000 mg | ORAL_TABLET | Freq: Four times a day (QID) | ORAL | Status: DC | PRN

## 2016-03-20 MED ORDER — ACETAMINOPHEN 325 MG PO TABS: 650.0000 mg | ORAL_TABLET | Freq: Four times a day (QID) | ORAL | Status: DC | PRN

## 2016-03-20 MED ORDER — ALUM & MAG HYDROXIDE-SIMETH 200-200-20 MG/5ML PO SUSP: 30.0000 mL | ORAL | Status: DC | PRN

## 2016-03-20 MED ORDER — ENSURE ENLIVE PO LIQD: 237.0000 mL | Freq: Two times a day (BID) | ORAL | Status: DC

## 2016-03-20 MED ORDER — ONDANSETRON 4 MG PO TBDP
4.0000 mg | ORAL_TABLET | Freq: Four times a day (QID) | ORAL | Status: AC | PRN
  Administered 2016-03-20: 4 mg via ORAL
  Filled 2016-03-20: qty 1

## 2016-03-20 MED ORDER — HALOPERIDOL LACTATE 5 MG/ML IJ SOLN
5.0000 mg | Freq: Four times a day (QID) | INTRAMUSCULAR | Status: DC | PRN
  Administered 2016-03-20: 5 mg via INTRAMUSCULAR
  Filled 2016-03-20: qty 1

## 2016-03-20 MED ORDER — LORAZEPAM 2 MG/ML IJ SOLN: 1.0000 mg | INTRAMUSCULAR | Status: DC | PRN

## 2016-03-20 MED ORDER — TRAZODONE HCL 50 MG PO TABS
50.0000 mg | ORAL_TABLET | Freq: Every evening | ORAL | Status: DC | PRN
  Administered 2016-03-20: 50 mg via ORAL
  Filled 2016-03-20: qty 1

## 2016-03-20 MED ORDER — DIPHENHYDRAMINE HCL 50 MG/ML IJ SOLN
50.0000 mg | Freq: Four times a day (QID) | INTRAMUSCULAR | Status: DC | PRN
  Administered 2016-03-20: 50 mg via INTRAMUSCULAR
  Filled 2016-03-20 (×2): qty 1

## 2016-03-20 MED ORDER — LORAZEPAM 1 MG PO TABS
1.0000 mg | ORAL_TABLET | ORAL | Status: DC | PRN
  Administered 2016-03-20 (×2): 1 mg via ORAL
  Filled 2016-03-20 (×2): qty 1

## 2016-03-20 MED ORDER — CITALOPRAM HYDROBROMIDE 10 MG PO TABS
ORAL_TABLET | ORAL | Status: AC
  Administered 2016-03-20: 10 mg via ORAL
  Filled 2016-03-20: qty 1

## 2016-03-20 MED ORDER — CLONIDINE HCL 0.1 MG PO TABS
0.1000 mg | ORAL_TABLET | Freq: Every day | ORAL | Status: AC
  Administered 2016-03-24 (×2): 0.1 mg via ORAL
  Filled 2016-03-20 (×2): qty 1

## 2016-03-20 MED ORDER — DICYCLOMINE HCL 20 MG PO TABS
20.0000 mg | ORAL_TABLET | Freq: Four times a day (QID) | ORAL | Status: AC | PRN
  Administered 2016-03-20: 20 mg via ORAL
  Filled 2016-03-20: qty 1

## 2016-03-20 MED ORDER — LORAZEPAM 2 MG/ML IJ SOLN: 1.0000 mg | Freq: Four times a day (QID) | INTRAMUSCULAR | Status: DC | PRN

## 2016-03-20 MED ORDER — ENSURE ENLIVE PO LIQD
237.0000 mL | ORAL | Status: DC
  Administered 2016-03-22 – 2016-03-26 (×4): 237 mL via ORAL

## 2016-03-20 MED ORDER — HALOPERIDOL 2 MG PO TABS
2.5000 mg | ORAL_TABLET | Freq: Two times a day (BID) | ORAL | Status: DC
  Administered 2016-03-20: 2.5 mg via ORAL
  Filled 2016-03-20 (×3): qty 1

## 2016-03-20 MED ORDER — BENZTROPINE MESYLATE 0.5 MG PO TABS
0.5000 mg | ORAL_TABLET | Freq: Two times a day (BID) | ORAL | Status: DC
Start: 2016-03-20 — End: 2016-03-21
  Administered 2016-03-20: 0.5 mg via ORAL
  Filled 2016-03-20 (×3): qty 1

## 2016-03-20 MED ORDER — LORAZEPAM 2 MG/ML IJ SOLN
1.0000 mg | Freq: Four times a day (QID) | INTRAMUSCULAR | Status: DC | PRN
  Administered 2016-03-20: 1 mg via INTRAMUSCULAR
  Filled 2016-03-20: qty 1

## 2016-03-20 MED ORDER — CITALOPRAM HYDROBROMIDE 10 MG PO TABS
10.0000 mg | ORAL_TABLET | Freq: Every day | ORAL | Status: DC
  Administered 2016-03-20: 10 mg via ORAL
  Filled 2016-03-20 (×3): qty 1

## 2016-03-20 MED ORDER — MAGNESIUM HYDROXIDE 400 MG/5ML PO SUSP: 30.0000 mL | Freq: Every day | ORAL | Status: DC | PRN

## 2016-03-20 MED ORDER — CLONIDINE HCL 0.1 MG PO TABS
0.1000 mg | ORAL_TABLET | ORAL | Status: AC
  Administered 2016-03-22 – 2016-03-23 (×3): 0.1 mg via ORAL
  Filled 2016-03-20 (×3): qty 1

## 2016-03-20 MED ORDER — BENZTROPINE MESYLATE 0.5 MG PO TABS
ORAL_TABLET | ORAL | Status: AC
  Administered 2016-03-20: 0.5 mg
  Filled 2016-03-20: qty 1

## 2016-03-20 MED ORDER — METHOCARBAMOL 500 MG PO TABS
500.0000 mg | ORAL_TABLET | Freq: Three times a day (TID) | ORAL | Status: AC | PRN
  Administered 2016-03-20 – 2016-03-24 (×5): 500 mg via ORAL
  Filled 2016-03-20 (×5): qty 1

## 2016-03-20 MED ORDER — HALOPERIDOL 0.5 MG PO TABS
ORAL_TABLET | ORAL | Status: AC
  Filled 2016-03-20: qty 1

## 2016-03-20 MED ORDER — NAPROXEN 500 MG PO TABS
500.0000 mg | ORAL_TABLET | Freq: Two times a day (BID) | ORAL | Status: AC | PRN
  Administered 2016-03-20 – 2016-03-24 (×4): 500 mg via ORAL
  Filled 2016-03-20 (×4): qty 1

## 2016-03-20 MED ORDER — CLONIDINE HCL 0.1 MG PO TABS
ORAL_TABLET | ORAL | Status: AC
  Administered 2016-03-20: 0.1 mg
  Filled 2016-03-20: qty 1

## 2016-03-20 MED ORDER — LOPERAMIDE HCL 2 MG PO CAPS: 2.0000 mg | ORAL_CAPSULE | ORAL | Status: AC | PRN

## 2016-03-20 NOTE — Progress Notes (Signed)
35 year old male pt [facility]. He reports on-going depression, substance abuse issues, and auditory hallucinations. John [name] reports he was taking medications but has been off for the past few months because he [facility]. He reports that he did not want to stop because the xanax helped with his PTSD and ultimately decided to stop everything if he could not have xanax with it. He reports on-going substance abuse issues including heroin and methamphetamine abuse. He also reports to drinking daily and reports that he has auditory hallucinations that tell him to kill himself among other things. John [name] reports having voices since about the age of 89. John [name] appears paranoid and irritable on admission and spoke about how he is always thinking people are out to get him. John [name] does endorse passive SI on admission but is able to contract for safety on the unit. He was oriented to the unit and safety maintained.

## 2016-03-20 NOTE — Tx Team (Signed)
Initial Interdisciplinary Treatment Plan   PATIENT STRESSORS: Marital or family conflict Medication change or noncompliance Substance abuse   PATIENT STRENGTHS: Ability for insight Average or above average intelligence Capable of independent living General fund of knowledge   PROBLEM LIST: Problem List/Patient Goals Date to be addressed Date deferred Reason deferred Estimated date of resolution  Substance Abuse 03/20/16     Auditory hallucinations 03/20/16     Depression 03/20/16     Suicidal thoughts 03/20/16     "I want to get back taking seroquel and cogentin" 03/20/16                              DISCHARGE CRITERIA:  Ability to meet basic life and health needs Improved stabilization in mood, thinking, and/or behavior Verbal commitment to aftercare and medication compliance Withdrawal symptoms are absent or subacute and managed without 24-hour nursing intervention  PRELIMINARY DISCHARGE PLAN: Attend aftercare/continuing care group Return to previous living arrangement  PATIENT/FAMIILY INVOLVEMENT: This treatment plan has been presented to and reviewed with the patient, John [name], and/or family member, .  The patient and family have been given the opportunity to ask questions and make suggestions.  John [name], John [name] 03/20/2016, 3:49 AM
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
## 2016-03-20 NOTE — Tx Team (Signed)
Interdisciplinary Treatment Plan Update (Adult) Date: 03/20/2016   Date: 03/20/2016 11:53 AM  Progress in Treatment:  Attending groups: Pt is new to milieu, continuing to assess  Participating in groups: Pt is new to milieu, continuing to assess  Taking medication as prescribed: Yes  Tolerating medication: Yes  Family/Significant othe contact made: No CSW assessing for appropriate contact Patient understands diagnosis: Continuing to assess Discussing patient identified problems/goals with staff: Yes  Medical problems stabilized or resolved: Yes  Denies suicidal/homicidal ideation: No, recently admitted with SI Patient has not harmed self or Others: Yes   New problem(s) identified: None identified at this time.   Discharge Plan or Barriers: Pt [facility] after his court date on 7/18. Will need shelter bed between discharge and admission.  Additional comments:  Patient and CSW reviewed pt's identified goals and treatment plan. Patient verbalized understanding and agreed to treatment plan.   Reason for Continuation of Hospitalization:  Depression Medication stabilization Suicidal ideation Withdrawal symptoms Hallucinations  Estimated length of stay: 3-5 days  Review of initial/current patient goals per problem list:   1.  Goal(s): Patient will participate in aftercare plan  Met:  Yes  Target date: 3-5 days from date of admission   As evidenced by: Patient will participate within aftercare plan AEB aftercare provider and housing plan at discharge being identified.   03/20/16: Pt is hopeful to go to [facility] after his court date on 7/18. Will need shelter bed between discharge and admission.  2.  Goal (s): Patient will exhibit decreased depressive symptoms and suicidal ideations.  Met:  No  Target date: 3-5 days from date of admission   As evidenced by: Patient will utilize self rating of depression at 3 or below and demonstrate decreased signs of  depression or be deemed stable for discharge by MD. 03/20/16: Pt was admitted with symptoms of depression, rating 10/10. Pt continues to present with flat affect and depressive symptoms.  Pt will demonstrate decreased symptoms of depression and rate depression at 3/10 or lower prior to discharge.  4.  Goal(s): Patient will demonstrate decreased signs of withdrawal due to substance abuse  Met:  No  Target date: 3-5 days from date of admission   As evidenced by: Patient will produce a CIWA/COWS score of 0, have stable vitals signs, and no symptoms of withdrawal  03/20/16: Pt reports symptoms of withdrawal and appears to be restless and somewhat disoriented. 5.  Goal(s): Patient will demonstrate decreased signs of psychosis  . Met:  No . Target date: 3-5 days from date of admission  . As evidenced by: Patient will demonstrate decreased frequency of AVH or return to baseline function   -03/20/16: Pt admitted with AVH and continues to endorse.   Attendees:  Patient:    Family:    Physician: Dr. Shea Evans, MD  03/20/2016 11:53 AM  Nursing: Larrie Kass., RN 03/20/2016 11:53 AM  Clinical Social Worker [place], [place]  03/20/2016 11:53 AM  Other:  03/20/2016 11:53 AM  Clinical: 03/20/2016 11:53 AM  Other:  03/20/2016 11:53 AM  Other:     Peri Maris, [place] Social Work [phone]

## 2016-03-20 NOTE — [facility] Group Notes (Signed)
[facility] LCSW Group Therapy  03/20/2016 , 11:20 AM   Type of Therapy:  Group Therapy  Participation Level:  Active  Participation Quality:  Attentive  Affect:  Appropriate  Cognitive:  Alert  Insight:  Improving  Engagement in Therapy:  Engaged  Modes of Intervention:  Discussion, Exploration and Socialization  Summary of Progress/Problems: Today's group focused on the term Diagnosis.  Participants were asked to define the term, and then pronounce whether it is a negative, positive or neutral term. Invited.  Chose to not attend.  Tyvon Eggenberger B 03/20/2016 , 11:20 AM   
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
## 2016-03-20 NOTE — Progress Notes (Signed)
DAR NOTE: Patient is irritable, anxious and agitated.  Complain of restlessness.  Reports pain, auditory and visual hallucinations.  Reports suicidal thoughts buts contracts for safety.  Rates depression at 10, hopelessness at 10, and anxiety at 10.  Maintained on routine safety checks.  Medications given as prescribed.  Support and encouragement offered as needed.  Patient reports withdrawal symptoms of tremors, anxiety, agitation, nausea, abdominal cramping, joint pain, and cravings.  Patient received Ativan for several agitation and withdrawals.  Food and fluid intake encouraged.  Patient given Vistaril for anxiety and Naproxen 500 mg for pain with good effect.

## 2016-03-20 NOTE — Progress Notes (Signed)
Entered in era. Actual time is in 1700. Froylan Hobby/NCPT1

## 2016-03-20 NOTE — Progress Notes (Signed)
NUTRITION ASSESSMENT  Pt identified as at risk on the Malnutrition Screen Tool  INTERVENTION: 1. Educated patient on the importance of nutrition and encouraged intake of food and beverages. 2. Discussed weight goals. 3. Supplements: will decrease Ensure Enlive to once/day, this supplement provides 350 kcal and 20 grams of protein  NUTRITION DIAGNOSIS: None at this time.  Goal: Pt to meet >/= 90% of their estimated nutrition needs.  Monitor:  PO intake  Assessment:  Pt voluntarily admitted for ongoing depression, substance abuse, and auditory hallucinations. Pt reports that he has been hearing voices on and off for over 20 years.   Pt is currently ordered Ensure Enlive BID. He is unsure of weight changes PTA. Limited weight hx in the chart. Will decrease Ensure to once/day. Continue to encourage PO intakes of meals and snacks.   35 y.o. male  Height: Ht Readings from Last 1 Encounters:  06/07/13 6' (1.829 m)    Weight: Wt Readings from Last 1 Encounters:  10/13/13 212 lb (96.163 kg)    Weight Hx: Wt Readings from Last 10 Encounters:  10/13/13 212 lb (96.163 kg)  06/07/13 215 lb (97.523 kg)    BMI:  28.75 kg/m2 Pt meets criteria for overweight based on current BMI.  Estimated Nutritional Needs: Kcal: 25-30 kcal/kg Protein: > 1 gram protein/kg Fluid: 1 ml/kcal  Diet Order: Diet regular Room service appropriate?: Yes; Fluid consistency:: Thin Pt is also offered choice of unit snacks mid-morning and mid-afternoon.  Pt is eating as desired.   Lab results and medications reviewed.      Trenton GammonJessica Allien Melberg, MS, RD, LDN Inpatient Clinical Dietitian Pager # [phone] After hours/weekend pager # [phone]
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
## 2016-03-20 NOTE — [facility] Suicide Risk Assessment (Signed)
[facility]   Nursing information obtained from:    Demographic factors:    Current Mental Status:    Loss Factors:    Historical Factors:    Risk Reduction Factors:     Total Time spent with patient: 30 minutes Principal Problem: Schizoaffective disorder, bipolar type (HCC) Diagnosis:   Patient Active Problem List   Diagnosis Date Noted  . Schizoaffective disorder, bipolar type (HCC) [F25.0] 03/20/2016  . PTSD (post-traumatic stress disorder) [F43.10] 03/20/2016  . Cocaine use disorder, moderate, dependence (HCC) [F14.20] 03/20/2016  . Stimulant use disorder (HCC) [F15.90] 03/20/2016  . Cannabis use disorder, moderate, dependence (HCC) [F12.20] 03/20/2016  . Alcohol use disorder, moderate, dependence (HCC) [F10.20] 03/20/2016  . Opioid use disorder, moderate, dependence (HCC) [F11.20] 03/20/2016  . Hepatitis C [B19.20] 10/13/2013   Subjective Data: Please see H&P.   Continued Clinical Symptoms:  Alcohol Use Disorder Identification Test Final Score (AUDIT): 14 The "Alcohol Use Disorders Identification Test", Guidelines for Use in Primary Care, Second Edition.  World Science writerHealth Organization [facility](WHO). Score between 0-7:  no or low risk or alcohol related problems. Score between 8-15:  moderate risk of alcohol related problems. Score between 16-19:  high risk of alcohol related problems. Score 20 or above:  warrants further diagnostic evaluation for alcohol dependence and treatment.   CLINICAL FACTORS:   Alcohol/Substance Abuse/Dependencies Unstable or Poor Therapeutic Relationship Previous Psychiatric Diagnoses and Treatments   Musculoskeletal: Strength & Muscle Tone: within normal limits Gait & Station: normal Patient leans: N/A  Psychiatric Specialty Exam: Physical Exam  Review of Systems  Psychiatric/Behavioral: Positive for depression, suicidal ideas, hallucinations and substance abuse. The patient is nervous/anxious and has insomnia.   All  other systems reviewed and are negative.   Blood pressure 119/67, pulse 94, temperature 97.3 F (36.3 C), temperature source Oral, resp. rate 18.There is no weight on file to calculate BMI.                      Please see H&P.                                     COGNITIVE FEATURES THAT CONTRIBUTE TO RISK:  Closed-mindedness, Polarized thinking and Thought constriction (tunnel vision)    SUICIDE RISK:   Moderate:  Frequent suicidal ideation with limited intensity, and duration, some specificity in terms of plans, no associated intent, good self-control, limited dysphoria/symptomatology, some risk factors present, and identifiable protective factors, including available and accessible social support.  PLAN OF CARE: Please see H&P.   I certify that inpatient services furnished can reasonably be expected to improve the patient's condition.   Andray Assefa, MD 03/20/2016, 1:16 PM

## 2016-03-21 MED ORDER — HALOPERIDOL 2 MG PO TABS
4.0000 mg | ORAL_TABLET | Freq: Two times a day (BID) | ORAL | Status: DC
  Administered 2016-03-21: 4 mg via ORAL
  Filled 2016-03-21 (×4): qty 2

## 2016-03-21 MED ORDER — BENZTROPINE MESYLATE 1 MG PO TABS
0.5000 mg | ORAL_TABLET | Freq: Two times a day (BID) | ORAL | Status: DC
  Administered 2016-03-21: 0.5 mg via ORAL
  Filled 2016-03-21 (×4): qty 1

## 2016-03-21 MED ORDER — QUETIAPINE FUMARATE 100 MG PO TABS
100.0000 mg | ORAL_TABLET | Freq: Every day | ORAL | Status: DC
  Filled 2016-03-21 (×2): qty 1

## 2016-03-21 NOTE — Progress Notes (Signed)
D: Pt was isolative and withdrawn to his room. Pt was very rude to the RN; claimed that RN told him at 0630pm that he would be getting some medications at 0700pm since he missed his 0500pm medications; RN was not on duty at 0630pm; he states, "A black guy told me at 0630 today would be getting my medicine at 7 since I missed my 5 o'clock medications; I don't see any other black guy here except you; you dare lie to me? I am American don't know where you are from. You know what? [facility]." Pt endorsed severe depression, severe anxiety and auditory hallucination. Pt was med seeking; states, "I am hearing voices I want 2mg  of Ativan and Haldol now; what else can I get; I'm withdrawing, I need everything I can get now." Pt later apologized for his behaviors.  A: Medications offered as prescribed.  Support, encouragement, and safe environment provided.  15-minute safety checks continue. R: Pt was med compliant. Safety checks continue.

## 2016-03-21 NOTE — [facility] Group Notes (Signed)
[facility]  03/21/2016 , 3:59 PM    Type of Therapy:  [facility] [employer] Presentation  Participation Level:  Active  Participation Quality:  Attentive  Affect:  Blunted  Cognitive:  Oriented  Insight:  Limited  Engagement in Therapy:  Engaged  Modes of Intervention:  Discussion, Education and Socialization  Summary of Progress/Problems:  [name] from [employer] came to present his recovery story and play the guitar.  Invited.  Chose to not attend.  John [name], John [name] 03/21/2016 , 3:59 PM

## 2016-03-21 NOTE — Progress Notes (Signed)
DAR NOTE: Patient presents with anxious affect and depressed mood.  Reports withdrawal symptoms of agitation, irritability, and anxiety.  Gait steady during ambulation.  Rates depression at 10, hopelessness at 10, and anxiety at 10.  Maintained on routine safety checks.  Medications given as prescribed.  Support and encouragement offered as needed.   Patient remained in his room most of this shift sleeping.  Vistaril 50 mg given for complain of anxiety with good effect.

## 2016-03-21 NOTE — Progress Notes (Signed)
[facility] MD Progress Note  03/21/2016 12:31 PM John [name]  MRN:  [id number] Subjective:  Pt states " I still feel anxious and depressed, I still have some withdrawal sx."  Objective:John [name] is a 35 y.o.Caucasian male , unemployed , single , lives with mother in [place], [place] , who presented to [facility] ED on a voluntary basis for worsening depressive sx as well as AH and SI .  Patient seen and chart reviewed.Discussed patient with treatment team.  Pt today seen in bed , has poor eye contact, response to questions are limited. Pt reports continued withdrawal sx as well as continued depression and AH that are command. Per staff - pt is medication seeking , seen as anxious and restless on the unit often. Pt continues to be motivated to go to a substance abuse program.      Principal Problem: Schizoaffective disorder, bipolar type (HCC) Diagnosis:   Patient Active Problem List   Diagnosis Date Noted  . Schizoaffective disorder, bipolar type (HCC) [F25.0] 03/20/2016  . PTSD (post-traumatic stress disorder) [F43.10] 03/20/2016  . Cocaine use disorder, moderate, dependence (HCC) [F14.20] 03/20/2016  . Stimulant use disorder (HCC) [F15.90] 03/20/2016  . Cannabis use disorder, moderate, dependence (HCC) [F12.20] 03/20/2016  . Alcohol use disorder, moderate, dependence (HCC) [F10.20] 03/20/2016  . Opioid use disorder, moderate, dependence (HCC) [F11.20] 03/20/2016  . Schizoaffective disorder (HCC) [F25.9] 03/20/2016  . Hepatitis C [B19.20] 10/13/2013   Total Time spent with patient: 25 minutes  Past Psychiatric History: Please see H&P.   Past Medical History:  Past Medical History  Diagnosis Date  . Drug addiction (HCC)   . Asthma   . Depression   . Seizures (HCC)     drug induced  . CVA (cerebral infarction)     drug induced  . Hepatitis C     Past Surgical History  Procedure Laterality Date  . Abdominal surgery     Family History:  Family History  Problem  Relation Age of Onset  . Diabetes Paternal Uncle   . Diabetes Paternal Grandmother   . Depression Mother   . Depression Father   . Alcoholism Other    Family Psychiatric  History: Please see H&P.  Social History:  History  Alcohol Use  . Yes    Comment: occ     History  Drug Use  . Yes  . Special: Marijuana, Methamphetamines, Heroin    Comment: Opana, heroin, and pain medications    Social History   Social History  . Marital Status: Single    Spouse Name: N/A  . Number of Children: N/A  . Years of Education: N/A   Social History Main Topics  . Smoking status: Current Every Day Smoker -- 2.00 packs/day    Types: Cigarettes  . Smokeless tobacco: None  . Alcohol Use: Yes     Comment: occ  . Drug Use: Yes    Special: Marijuana, Methamphetamines, Heroin     Comment: Opana, heroin, and pain medications  . Sexual Activity: Not Asked   Other Topics Concern  . None   Social History Narrative   Additional Social History:    History of alcohol / drug use?: Yes (heroin and methamphetamine use reported )                    Sleep: Fair  Appetite:  Fair  Current Medications: Current Facility-Administered Medications  Medication Dose Route Frequency Provider Last Rate Last Dose  . alum & mag hydroxide-simeth (MAALOX/MYLANTA)  200-200-20 MG/5ML suspension 30 mL  30 mL Oral Q4H PRN Court Joyharles E Kober, PA-C      . haloperidol (HALDOL) tablet 2.5 mg  2.5 mg Oral BID Jomarie LongsSaramma Corinda Ammon, MD   2.5 mg at 03/20/16 1151   And  . benztropine (COGENTIN) tablet 0.5 mg  0.5 mg Oral BID Jomarie LongsSaramma Bali Lyn, MD   0.5 mg at 03/20/16 1145  . citalopram (CELEXA) tablet 10 mg  10 mg Oral Daily Nevah Dalal, MD   10 mg at 03/20/16 1146  . cloNIDine (CATAPRES) tablet 0.1 mg  0.1 mg Oral QID Jomarie LongsSaramma Thai Hemrick, MD   0.1 mg at 03/20/16 2122   Followed by  . [START ON 03/22/2016] cloNIDine (CATAPRES) tablet 0.1 mg  0.1 mg Oral BH-qamhs Jomarie LongsSaramma Soumya Colson, MD       Followed by  . [START ON 03/24/2016]  cloNIDine (CATAPRES) tablet 0.1 mg  0.1 mg Oral QAC breakfast Abenezer Odonell, MD      . dicyclomine (BENTYL) tablet 20 mg  20 mg Oral Q6H PRN Jomarie LongsSaramma Aicia Babinski, MD   20 mg at 03/20/16 1557  . diphenhydrAMINE (BENADRYL) injection 50 mg  50 mg Intramuscular Q6H PRN Truman Haywardakia S Starkes, FNP   50 mg at 03/20/16 2123  . feeding supplement (ENSURE ENLIVE) (ENSURE ENLIVE) liquid 237 mL  237 mL Oral Q24H Amber Guthridge, MD   237 mL at 03/20/16 0730  . haloperidol lactate (HALDOL) injection 5 mg  5 mg Intramuscular Q6H PRN Truman Haywardakia S Starkes, FNP   5 mg at 03/20/16 2123  . hydrOXYzine (ATARAX/VISTARIL) tablet 25 mg  25 mg Oral Q6H PRN Court Joyharles E Kober, PA-C   25 mg at 03/20/16 1555  . loperamide (IMODIUM) capsule 2-4 mg  2-4 mg Oral PRN Jomarie LongsSaramma Daegan Arizmendi, MD      . LORazepam (ATIVAN) injection 1 mg  1 mg Intramuscular Q6H PRN Truman Haywardakia S Starkes, FNP   1 mg at 03/20/16 2133  . LORazepam (ATIVAN) tablet 1 mg  1 mg Oral Q4H PRN Jomarie LongsSaramma Veleta Yamamoto, MD   1 mg at 03/20/16 2032  . magnesium hydroxide (MILK OF MAGNESIA) suspension 30 mL  30 mL Oral Daily PRN Court Joyharles E Kober, PA-C      . methocarbamol (ROBAXIN) tablet 500 mg  500 mg Oral Q8H PRN Jomarie LongsSaramma Roen Macgowan, MD   500 mg at 03/20/16 1552  . naproxen (NAPROSYN) tablet 500 mg  500 mg Oral BID PRN Jomarie LongsSaramma Arti Trang, MD   500 mg at 03/20/16 1644  . nicotine (NICODERM CQ - dosed in mg/24 hours) patch 21 mg  21 mg Transdermal Daily Court Joyharles E Kober, PA-C   21 mg at 03/20/16 1148  . ondansetron (ZOFRAN-ODT) disintegrating tablet 4 mg  4 mg Oral Q6H PRN Jomarie LongsSaramma Janyiah Silveri, MD   4 mg at 03/20/16 1556  . QUEtiapine (SEROQUEL) tablet 100 mg  100 mg Oral QHS Jomarie LongsSaramma Paddy Neis, MD        Lab Results: No results found for this or any previous visit (from the past 48 hour(s)).  Blood Alcohol level:  Lab Results  Component Value Date   [facility] <11 08/06/2014    Metabolic Disorder Labs: No results found for: HGBA1C, MPG No results found for: PROLACTIN No results found for: CHOL, TRIG, HDL, CHOLHDL, VLDL,  LDLCALC  Physical Findings: AIMS: Facial and Oral Movements Muscles of Facial Expression: None, normal Lips and Perioral Area: None, normal Jaw: None, normal Tongue: None, normal,Extremity Movements Upper (arms, wrists, hands, fingers): None, normal Lower (legs, knees, ankles, toes): None, normal, Trunk Movements  Neck, shoulders, hips: None, normal, Overall Severity Severity of abnormal movements (highest score from questions above): None, normal Incapacitation due to abnormal movements: None, normal Patient's awareness of abnormal movements (rate only patient's report): No Awareness, Dental Status Current problems with teeth and/or dentures?: No Does patient usually wear dentures?: No  CIWA:  CIWA-Ar Total: 11 COWS:  COWS Total Score: 9  Musculoskeletal: Strength & Muscle Tone: within normal limits Gait & Station: normal Patient leans: N/A  Psychiatric Specialty Exam: Physical Exam  Nursing note and vitals reviewed.   Review of Systems  Psychiatric/Behavioral: Positive for depression, suicidal ideas, hallucinations and substance abuse. The patient is nervous/anxious.   All other systems reviewed and are negative.   Blood pressure 110/67, pulse 100, temperature 97.6 F (36.4 C), temperature source Oral, resp. rate 16, SpO2 99 %.There is no weight on file to calculate BMI.  General Appearance: Guarded  Eye Contact:  None  Speech:  Slow  Volume:  Decreased  Mood:  Anxious  Affect:  Constricted  Thought Process:  Linear and Descriptions of Associations: Intact  Orientation:  Other:  person, place  Thought Content:  Hallucinations: Auditory Command:  kill self or others and Rumination  Suicidal Thoughts:  Yes.  without intent/plan  Homicidal Thoughts:  Yes. Contracts for safety on the unit  Memory:  Immediate;   Fair Recent;   Fair Remote;   Poor  Judgement:  Impaired  Insight:  Shallow  Psychomotor Activity:  Restlessness  Concentration:  Concentration: Fair and  Attention Span: Fair  Recall:  [employer]is[employer]Fair  Fund of Knowledge:  Fair  Language:  Fair  Akathisia:  No  Handed:  Right  AIMS (if indicated):     Assets:  Desire for Improvement  ADL's:  Intact  Cognition:  WNL  Sleep:  Number of Hours: 6.5     Treatment Plan Summary:Ibn Delila SpenceKeown is a 35 y.o.Caucasian male , unemployed , single , lives [place] mother in [place], [place] , who [facility] on a voluntary basis for worsening depressive sx as well as AH and SI .Pt continues to be depressed, anxious and psychotic. Will continue treatment.   Daily contact with patient to assess and evaluate symptoms and progress in treatment and Medication management Will increase Haldol to 4 mg po bid for psychosis. Will continue Cogentin 0.5 mg po bid for EPS. Will reduce seroquel to 100 mg po qhs for augmenting the effect of haldol.Pt is seen as very drowsy. Will continue Celexa 10 mg po daily for PTSD sx. Will continue  clonidine protocol, ativan protocol for opioid, bzd abuse.  Will continue to monitor vitals ,medication compliance and treatment side effects while patient is here.  Will monitor for medical issues as well as call consult as needed.  Reviewed labs UDS - pos for opiates, stimulants , ALT slightly elevates ( hx of hep c) ,will order tsh, lipid panel, hba1c. CSW will continue working on disposition. Pt is very motivated to go to a substance abuse treatment program. Patient to participate in therapeutic milieu .   Krystl Wickware, MD 03/21/2016, 12:31 PM

## 2016-03-22 ENCOUNTER — Encounter (HOSPITAL_COMMUNITY): Payer: Self-pay | Admitting: Emergency Medicine

## 2016-03-22 DIAGNOSIS — G2402 Drug induced acute dystonia: Secondary | ICD-10-CM | POA: Clinically undetermined

## 2016-03-22 DIAGNOSIS — T43505A Adverse effect of unspecified antipsychotics and neuroleptics, initial encounter: Secondary | ICD-10-CM

## 2016-03-22 LAB — LIPID PANEL
Cholesterol: 177 mg/dL (ref 0–200)
HDL: 42 mg/dL (ref >40)
LDL CALC: 113 mg/dL — AB (ref 0–99)
Total CHOL/HDL Ratio: 4.2 RATIO
Triglycerides: 109 mg/dL (ref <150)
VLDL: 22 mg/dL (ref 0–40)

## 2016-03-22 LAB — TSH: TSH: 0.916 u[IU]/mL (ref 0.350–4.500)

## 2016-03-22 LAB — GLUCOSE, CAPILLARY: GLUCOSE-CAPILLARY: 138 mg/dL — AB (ref 65–99)

## 2016-03-22 MED ORDER — DIPHENHYDRAMINE HCL 50 MG/ML IJ SOLN: 50.0000 mg | Freq: Four times a day (QID) | INTRAMUSCULAR | Status: DC | PRN

## 2016-03-22 MED ORDER — PREDNISONE 10 MG PO TABS: 40.0000 mg | ORAL_TABLET | Freq: Every day | ORAL | Status: DC

## 2016-03-22 MED ORDER — DIPHENHYDRAMINE HCL 50 MG/ML IJ SOLN
INTRAMUSCULAR | Status: AC
  Administered 2016-03-22: 17:00:00
  Filled 2016-03-22: qty 1

## 2016-03-22 MED ORDER — DIPHENHYDRAMINE HCL 25 MG PO CAPS
50.0000 mg | ORAL_CAPSULE | Freq: Four times a day (QID) | ORAL | Status: DC | PRN
  Administered 2016-03-22 – 2016-03-23 (×2): 50 mg via ORAL
  Filled 2016-03-22 (×2): qty 2

## 2016-03-22 MED ORDER — EPINEPHRINE 0.3 MG/0.3ML IJ SOAJ: 0.3000 mg | Freq: Once | INTRAMUSCULAR | Status: DC

## 2016-03-22 MED ORDER — LORAZEPAM 2 MG/ML IJ SOLN
2.0000 mg | Freq: Once | INTRAMUSCULAR | Status: AC
  Administered 2016-03-22: 2 mg via INTRAMUSCULAR

## 2016-03-22 MED ORDER — DIPHENHYDRAMINE HCL 25 MG PO CAPS: 50.0000 mg | ORAL_CAPSULE | ORAL | Status: DC | PRN

## 2016-03-22 MED ORDER — LORAZEPAM 2 MG/ML IJ SOLN
1.0000 mg | INTRAMUSCULAR | Status: DC | PRN
  Filled 2016-03-22: qty 1

## 2016-03-22 MED ORDER — LORAZEPAM 2 MG/ML IJ SOLN: 1.0000 mg | Freq: Once | INTRAMUSCULAR | Status: DC

## 2016-03-22 MED ORDER — QUETIAPINE FUMARATE 25 MG PO TABS
25.0000 mg | ORAL_TABLET | Freq: Three times a day (TID) | ORAL | Status: DC | PRN
  Administered 2016-03-24: 25 mg via ORAL
  Filled 2016-03-22: qty 1

## 2016-03-22 MED ORDER — EPINEPHRINE 0.3 MG/0.3ML IJ SOAJ
0.3000 mg | Freq: Once | INTRAMUSCULAR | Status: AC
  Administered 2016-03-22: 0.3 mg via INTRAMUSCULAR

## 2016-03-22 MED ORDER — QUETIAPINE FUMARATE 200 MG PO TABS
200.0000 mg | ORAL_TABLET | Freq: Every day | ORAL | Status: DC
  Administered 2016-03-22 – 2016-03-25 (×4): 200 mg via ORAL
  Filled 2016-03-22: qty 2
  Filled 2016-03-22: qty 7
  Filled 2016-03-22 (×3): qty 2
  Filled 2016-03-22: qty 7
  Filled 2016-03-22 (×4): qty 2

## 2016-03-22 MED ORDER — DIPHENHYDRAMINE HCL 50 MG/ML IJ SOLN: 50.0000 mg | Freq: Once | INTRAMUSCULAR | Status: DC

## 2016-03-22 MED ORDER — BENZTROPINE MESYLATE 1 MG PO TABS
1.0000 mg | ORAL_TABLET | Freq: Every day | ORAL | Status: DC
  Administered 2016-03-22 – 2016-03-25 (×4): 1 mg via ORAL
  Filled 2016-03-22 (×5): qty 1
  Filled 2016-03-22 (×2): qty 7
  Filled 2016-03-22: qty 1

## 2016-03-22 MED ORDER — PREDNISONE 20 MG PO TABS
40.0000 mg | ORAL_TABLET | Freq: Every day | ORAL | Status: AC
  Administered 2016-03-23: 40 mg via ORAL
  Filled 2016-03-22: qty 2

## 2016-03-22 MED ORDER — FAMOTIDINE 20 MG PO TABS
40.0000 mg | ORAL_TABLET | Freq: Once | ORAL | Status: AC
  Administered 2016-03-22: 40 mg via ORAL
  Filled 2016-03-22: qty 2

## 2016-03-22 MED ORDER — DIPHENHYDRAMINE HCL 50 MG/ML IJ SOLN
50.0000 mg | Freq: Four times a day (QID) | INTRAMUSCULAR | Status: DC | PRN
  Filled 2016-03-22: qty 1

## 2016-03-22 MED ORDER — PREDNISONE 20 MG PO TABS
60.0000 mg | ORAL_TABLET | Freq: Once | ORAL | Status: AC
  Administered 2016-03-22: 60 mg via ORAL
  Filled 2016-03-22: qty 3

## 2016-03-22 MED ORDER — LORAZEPAM 0.5 MG PO TABS
0.5000 mg | ORAL_TABLET | Freq: Three times a day (TID) | ORAL | Status: DC
  Administered 2016-03-23: 0.5 mg via ORAL
  Filled 2016-03-22 (×2): qty 1

## 2016-03-22 MED ORDER — BENZTROPINE MESYLATE 1 MG PO TABS
ORAL_TABLET | ORAL | Status: AC
  Filled 2016-03-22: qty 2

## 2016-03-22 MED ORDER — LORAZEPAM 2 MG/ML IJ SOLN
INTRAMUSCULAR | Status: AC
  Administered 2016-03-22: 17:00:00
  Filled 2016-03-22: qty 1

## 2016-03-22 MED ORDER — EPINEPHRINE 0.3 MG/0.3ML IJ SOAJ
INTRAMUSCULAR | Status: AC
Start: 2016-03-22 — End: 2016-03-22
  Filled 2016-03-22: qty 0.3

## 2016-03-22 MED ORDER — LORAZEPAM 1 MG PO TABS
1.0000 mg | ORAL_TABLET | ORAL | Status: DC | PRN
  Administered 2016-03-22 – 2016-03-26 (×15): 1 mg via ORAL
  Filled 2016-03-22 (×15): qty 1

## 2016-03-22 MED ORDER — BENZTROPINE MESYLATE 2 MG PO TABS
2.0000 mg | ORAL_TABLET | Freq: Once | ORAL | Status: AC
Start: 2016-03-22 — End: 2016-03-22
  Administered 2016-03-22: 2 mg via ORAL
  Filled 2016-03-22: qty 1

## 2016-03-22 NOTE — ED Notes (Signed)
Per EMS[facility] after taking haldol. Pt had tongue swelling and SOB. Given Epi 0.03mg  IM and 50mg  Benadryl IM. Pt swelling decreased but still c/o SOB. EMS reports pt was 94% on RA and was put on 2L of O2 and O2 sats went up to 98%.

## 2016-03-22 NOTE — ED Notes (Signed)
Bed: [id number] Expected date:  Expected time:  Means of arrival:  Comments: EMS-allergic reaction

## 2016-03-22 NOTE — ED Notes (Signed)
PA at bedside.

## 2016-03-22 NOTE — ED Notes (Signed)
MD at bedside. 

## 2016-03-22 NOTE — Progress Notes (Addendum)
[facility]  03/22/2016 3:11 PM Artis FlockRobert Schaffer  MRN:  [id number] Subjective:  Pt states " I had an allergic reaction to haldol.'   Objective:Bereket Delila SpenceKeown is a 35 y.o.Caucasian male , unemployed , single , lives [place] mother in [place], [place] , who presented to [facility] ED on a voluntary basis for worsening depressive sx as well as AH and SI .  Patient seen and chart reviewed.Discussed patient with treatment team.  Pt today early AM had an acute dystonic reaction to haldol and was send to ED for emergency care. Pt was given epinephrine, benadryl 50 IM as well as prednisone .  Pt was discharged from ED - currently denies any distress . Pt continues to have some malaise , lethargy as well as depressive sx. Pt continues to be motivated to go to a substance abuse program.      Principal Problem: Schizoaffective disorder, bipolar type (HCC) Diagnosis:   Patient Active Problem List   Diagnosis Date Noted  . Neuroleptic induced acute dystonia [G24.02] 03/22/2016  . Schizoaffective disorder, bipolar type (HCC) [F25.0] 03/20/2016  . PTSD (post-traumatic stress disorder) [F43.10] 03/20/2016  . Cocaine use disorder, moderate, dependence (HCC) [F14.20] 03/20/2016  . Stimulant use disorder (HCC) [F15.90] 03/20/2016  . Cannabis use disorder, moderate, dependence (HCC) [F12.20] 03/20/2016  . Alcohol use disorder, moderate, dependence (HCC) [F10.20] 03/20/2016  . Opioid use disorder, moderate, dependence (HCC) [F11.20] 03/20/2016  . Schizoaffective disorder (HCC) [F25.9] 03/20/2016  . Hepatitis C [B19.20] 10/13/2013   Total Time spent with patient: 25 minutes  Past Psychiatric History: Please see H&P.   Past Medical History:  Past Medical History  Diagnosis Date  . Drug addiction (HCC)   . Asthma   . Depression   . Seizures (HCC)     drug induced  . CVA (cerebral infarction)     drug induced  . Hepatitis C     Past Surgical History  Procedure Laterality Date  .  Abdominal surgery     Family History:  Family History  Problem Relation Age of Onset  . Diabetes Paternal Uncle   . Diabetes Paternal Grandmother   . Depression Mother   . Depression Father   . Alcoholism Other    Family Psychiatric  History: Please see H&P.  Social History:  History  Alcohol Use  . Yes    Comment: occ     History  Drug Use  . Yes  . Special: Marijuana, Methamphetamines, Heroin    Comment: Opana, heroin, and pain medications    Social History   Social History  . Marital Status: Single    Spouse Name: N/A  . Number of Children: N/A  . Years of Education: N/A   Social History Main Topics  . Smoking status: Current Every Day Smoker -- 2.00 packs/day    Types: Cigarettes  . Smokeless tobacco: None  . Alcohol Use: Yes     Comment: occ  . Drug Use: Yes    Special: Marijuana, Methamphetamines, Heroin     Comment: Opana, heroin, and pain medications  . Sexual Activity: Not Asked   Other Topics Concern  . None   Social History Narrative   Additional Social History:    History of alcohol / drug use?: Yes (heroin and methamphetamine use reported )                    Sleep: Fair  Appetite:  Fair  Current Medications: Current Facility-Administered Medications  Medication Dose Route  Frequency Provider Last Rate Last Dose  . alum & mag hydroxide-simeth (MAALOX/MYLANTA) 200-200-20 MG/5ML suspension 30 mL  30 mL Oral Q4H PRN Court Joyharles E Kober, PA-C      . cloNIDine (CATAPRES) tablet 0.1 mg  0.1 mg Oral BH-qamhs Jomarie LongsSaramma Srah Ake, MD       Followed by  . [START ON 03/24/2016] cloNIDine (CATAPRES) tablet 0.1 mg  0.1 mg Oral QAC breakfast Maclin Guerrette, MD      . dicyclomine (BENTYL) tablet 20 mg  20 mg Oral Q6H PRN Jomarie LongsSaramma Demia Viera, MD   20 mg at 03/20/16 1557  . EPINEPHrine (EPI-PEN) 0.3 mg/0.3 mL injection           . EPINEPHrine (EPI-PEN) injection 0.3 mg  0.3 mg Intramuscular Once Court Joyharles E Kober, PA-C      . feeding supplement (ENSURE ENLIVE)  (ENSURE ENLIVE) liquid 237 mL  237 mL Oral Q24H Jamille Yoshino, MD   237 mL at 03/22/16 0637  . hydrOXYzine (ATARAX/VISTARIL) tablet 25 mg  25 mg Oral Q6H PRN Court Joyharles E Kober, PA-C   25 mg at 03/21/16 1417  . loperamide (IMODIUM) capsule 2-4 mg  2-4 mg Oral PRN Providence Stivers, MD      . LORazepam (ATIVAN) tablet 1 mg  1 mg Oral Q4H PRN Jomarie LongsSaramma Artha Stavros, MD       Or  . LORazepam (ATIVAN) injection 1 mg  1 mg Intramuscular Q4H PRN Pyper Olexa, MD      . magnesium hydroxide (MILK OF MAGNESIA) suspension 30 mL  30 mL Oral Daily PRN Court Joyharles E Kober, PA-C      . methocarbamol (ROBAXIN) tablet 500 mg  500 mg Oral Q8H PRN Jomarie LongsSaramma Kinser Fellman, MD   500 mg at 03/20/16 1552  . naproxen (NAPROSYN) tablet 500 mg  500 mg Oral BID PRN Jomarie LongsSaramma Alexia Dinger, MD   500 mg at 03/20/16 1644  . nicotine (NICODERM CQ - dosed in mg/24 hours) patch 21 mg  21 mg Transdermal Daily Court Joyharles E Kober, PA-C   21 mg at 03/20/16 1148  . ondansetron (ZOFRAN-ODT) disintegrating tablet 4 mg  4 mg Oral Q6H PRN Jomarie LongsSaramma Aries Kasa, MD   4 mg at 03/20/16 1556  . [START ON 03/23/2016] predniSONE (DELTASONE) tablet 40 mg  40 mg Oral Q breakfast Sukhmani Fetherolf, MD      . QUEtiapine (SEROQUEL) tablet 200 mg  200 mg Oral QHS Lillyan Hitson, MD      . QUEtiapine (SEROQUEL) tablet 25 mg  25 mg Oral TID PRN Jomarie LongsSaramma Trust Leh, MD        Lab Results:  Results for orders placed or performed during the hospital encounter of 03/20/16 (from the past 48 hour(s))  TSH     Status: None   Collection Time: 03/22/16  6:13 AM  Result Value Ref Range   TSH 0.916 0.350 - 4.500 uIU/mL    Comment: [facility]  Lipid panel     Status: Abnormal   Collection Time: 03/22/16  6:13 AM  Result Value Ref Range   Cholesterol 177 0 - 200 mg/dL   Triglycerides 454109 <098<150 mg/dL   HDL 42 >11>40 mg/dL   Total CHOL/HDL Ratio 4.2 RATIO   VLDL 22 0 - 40 mg/dL   LDL Cholesterol 914113 (H) 0 - 99 mg/dL    Comment:        Total Cholesterol/HDL:CHD Risk Coronary  Heart Disease Risk Table  Men   Women  1/2 Average Risk   3.4   3.3  Average Risk       5.0   4.4  2 X Average Risk   9.6   7.1  3 X Average Risk  23.4   11.0        Use the calculated Patient Ratio above and the CHD Risk Table to determine the patient's CHD Risk.        ATP III CLASSIFICATION (LDL):  <100     mg/dL   Optimal  161-096  mg/dL   Near or Above                    Optimal  130-159  mg/dL   Borderline  045-409  mg/dL   High  >811     mg/dL   Very High Performed at [facility]     Blood Alcohol level:  Lab Results  Component Value Date   [facility] <11 08/06/2014    Metabolic Disorder Labs: No results found for: HGBA1C, MPG No results found for: PROLACTIN Lab Results  Component Value Date   CHOL 177 03/22/2016   TRIG 109 03/22/2016   HDL 42 03/22/2016   CHOLHDL 4.2 03/22/2016   VLDL 22 03/22/2016   LDLCALC 113* 03/22/2016    Physical Findings: AIMS: Facial and Oral Movements Muscles of Facial Expression: None, normal Lips and Perioral Area: None, normal Jaw: None, normal Tongue: None, normal,Extremity Movements Upper (arms, wrists, hands, fingers): None, normal Lower (legs, knees, ankles, toes): None, normal, Trunk Movements Neck, shoulders, hips: None, normal, Overall Severity Severity of abnormal movements (highest score from questions above): None, normal Incapacitation due to abnormal movements: None, normal Patient's awareness of abnormal movements (rate only patient's report): No Awareness, Dental Status Current problems with teeth and/or dentures?: No Does patient usually wear dentures?: No  CIWA:  CIWA-Ar Total: 2 COWS:  COWS Total Score: 4  Musculoskeletal: Strength & Muscle Tone: within normal limits Gait & Station: normal Patient leans: N/A  Psychiatric Specialty Exam: Physical Exam  Nursing note and vitals reviewed.   Review of Systems  Constitutional: Positive for malaise/fatigue.  Psychiatric/Behavioral:  Positive for depression, hallucinations and substance abuse. The patient is nervous/anxious.   All other systems reviewed and are negative.   Blood pressure 120/66, pulse 71, temperature 97.6 F (36.4 C), temperature source Oral, resp. rate 18, SpO2 97 %.There is no weight on file to calculate BMI.  General Appearance: Guarded  Eye Contact:  None  Speech:  Slow  Volume:  Decreased  Mood:  Anxious  Affect:  Constricted  Thought Process:  Linear and Descriptions of Associations: Intact  Orientation:  Other:  person, place  Thought Content:  Hallucinations: Auditory Command:  kill self or others and Rumination  Suicidal Thoughts:  Yes.  without intent/plan  Homicidal Thoughts:  Yes. Contracts for safety on the unit  Memory:  Immediate;   Fair Recent;   Fair Remote;   Poor  Judgement:  Impaired  Insight:  Shallow  Psychomotor Activity:  Restlessness  Concentration:  Concentration: Fair and Attention Span: Fair  Recall:  [employer] of Knowledge:  Fair  Language:  Fair  Akathisia:  No  Handed:  Right  AIMS (if indicated):     Assets:  Desire for Improvement  ADL's:  Intact  Cognition:  WNL  Sleep:  Number of Hours: 11     Treatment Plan Summary:Bliss Heckler is a 35 y.o.Caucasian male , unemployed ,  single , lives with mother in [place], [place] , who presented to [facility] ED on a voluntary basis for worsening depressive sx as well as AH and SI .Pt continues to be depressed, anxious and psychotic. Pt had an acute dystonic reaction to haldol and was treated in ED this AM. Will discontinue Haldol , will increase seroquel , which he has tolerated well in the past .Will continue treatment.   Daily contact with patient to assess and evaluate symptoms and progress in treatment and Medication management Will discontinue Haldol due to acute dystonia. Will continue Cogentin 1  mg po qhs for EPS. Will increase  seroquel to 200 mg po qhs for mood sx/psychosis. Will discontinue Celexa  . Will continue  clonidine protocol, ativan protocol for opioid, bzd abuse.  Will continue to monitor vitals ,medication compliance and treatment side effects while patient is here.  Will monitor for medical issues as well as call consult as needed.  Reviewed labs UDS - pos for opiates, stimulants , ALT slightly elevates ( hx of hep c) , tsh wnl , lipid panel- wnl , pending hba1c. CSW will continue working on disposition. Pt is very motivated to go to a substance abuse treatment program. Patient to participate in therapeutic milieu .   Lakely Elmendorf, MD 03/22/2016, 3:11 PM   Addendum 03/22/16 4;50 pm Pt had another dystonic reaction , was seen with severe dystonia of his neck , jaw clenching - provided benadryl 50 mg IM X 2 DOSES , Ativan 1 mg IM x 1 dose and 2 mg x 1 dose - pt had 3-4 episodes and he panicked and was seen as hyperventilating , c/o chest pain - EKG - wnl , VS - wnl.  Pt calmed down after a while . Pt is not on any antipsychotics at this time.

## 2016-03-22 NOTE — Progress Notes (Addendum)
03/22/2016 1900- 03/23/2016 0730  Data Awake, alert.  Reported improvement in EPS at beginning of shift, decreased swelling in mouth, does report some restlessness.  Prior to shift (1836) received Robaxin for pain and Vistaril for anxiety and EPS which patient states was effective.  C/O pain 7/10 and anxiety.  Affect blunted, sullen, mood anxious.  Denies SI, HI, AVH.  At 2330 patient c/o thickness in tongue, no swelling observed, no rigidity observed.  Action Spoke with patient 1:1 and offered support throughout shift.  Given PRN benadryl for ongoing EPS at 2000, scheduled cogentin given 2100, PRN ativan for continued anxiety, encouraged fluids and educated patient on antihistamines and dryness side effect.  Naproxen given for pain.  NP Renata Capriceonrad notified of 2330 EPS complaint, NP was notified of recent meds given, order received for one time dose of cogentin 2mg .  Given to patient.  Patient remained on 15 minute checks.   Response  Reported improvement in pain, anxiety with PRN's. Asleep after Cogenitin.  Needed additional dose of Vistaril/Ativan 0224 for anxiety and "tongue stiffness."  Effective per patient.   Addendum [id number] patient approached nurse with relaxed posture  complained of "esophagus tightness," benadryl given.  No tongue swelling or rigidity observed.  Stated "I'm having a panic attack" requesting Ativan.  Ativan given at [phone].  After administration spoke with patient about breathing techniques, patient said "none of that works, I don't think you understand with all of the tongue stuff I'm dealing with I need the ativan, something to take the edge off."

## 2016-03-22 NOTE — Progress Notes (Signed)
Patient ID: John [name], male   DOB: [birth year], 35 y.o.   MRN: [name]  Charge Nurse Note- Patient's night RN and day shift RN alerted Clinical research associatewriter that patient was having a suspected allergic reaction. Patient's RN was advised to call for EMS. Writer spoke with Consulting civil engineerCharge RN in [facility] ED to alert patient transfer for ED evaluation.

## 2016-03-22 NOTE — [facility] Group Notes (Signed)
[facility] Group Notes:  (Counselor/Nursing/MHT/Case Management/Adjunct)  03/22/2016 1:15PM  Type of Therapy:  Group Therapy  Participation Level:  Active  Participation Quality:  Appropriate  Affect:  Flat  Cognitive:  Oriented  Insight:  Improving  Engagement in Group:  Limited  Engagement in Therapy:  Limited  Modes of Intervention:  Discussion, Exploration and Socialization  Summary of Progress/Problems: The topic for group was balance in life.  Pt participated in the discussion about when their life was in balance and out of balance and how this feels.  Pt discussed ways to get back in balance and short term goals they can work on to get where they want to be. Invited.  Chose to not attend.   Ida Rogueorth, Teara Duerksen B 03/22/2016 3:18 PM

## 2016-03-22 NOTE — ED Notes (Signed)
[facility] called for transport. 

## 2016-03-22 NOTE — Progress Notes (Signed)
Pt has been in bed all even resting with eyes closed. Pt was very difficult to arouse.  Pt does not look to be in any distress at this time. Breaths had been even and regular with occasional snoring and position changes. 15-minute safety checks also continue at this time.

## 2016-03-22 NOTE — Discharge Instructions (Signed)

## 2016-03-22 NOTE — Progress Notes (Signed)
At 1620 patient was noted to be clearing his throat loudly, patient knocked on the door to the medication window to alert this writer that he was in distress.  Patient was noted to have a reddened face and an profuse amount of drool coming from his mouth.  Upon further examination it was noted that the patient's tongue was swollen and purple.  Patient's head was also turned to his left side and he was unable to move it.  Patient's limbs were also noted to be rigid and patient has a difficult time moving them.  Patient was administered 50 mg of benadryl IM , upon further assessment by the physician the patient was given another 50 mg of benadryl IM along with 1 mg of ativan.  Patient's vital signs were taken and found to be WNL O2 saturation at 100% on room air patient exhibited having a patent airway and good circulation. Patient complained of chest pain and was assessed by physician when ordered 2mg  of Ativan IM for severe anxiety.  EKG performed and the findings were normal sinus rhythm, normal EKG.  The swelling and rigidity subsided and patient was able to calm, anxiety decreased along with pain in chest.  Patient was monitored by physician and all symptoms of acute distress subsided. Patient was able to return to normal activities.   Code blue called, patient assessed by code blue team, physician assessed patient, medications administered.  Patient monitored for a period of time by code blue staff, patient returned to normal functioning.  Continue to monitor.

## 2016-03-22 NOTE — Progress Notes (Signed)
D-  Patient was noted to be having an allergic reaction with swelling of the face and tongue.  Patient complained of difficulty breathing and benadryl and epinephrine was administered by the order of physician.  Patient was sent to the ER to the for further evaluation.  Patient was medically cleared and returned to the unit.  Patient denies SI, HI and AVH at this time.   A- Assess patient for safety, offer medications as prescribed, engage patient in 1:1 staff talks.   R-  Continue to monitor for safety.

## 2016-03-22 NOTE — ED Notes (Signed)
[facility]

## 2016-03-22 NOTE — ED Provider Notes (Signed)
Emergency Department Provider Note  Time seen: Approximately 8:42 AM  I have reviewed the triage vital signs and the nursing notes.   HISTORY  Chief Complaint Allergic Reaction  HPI John [name] is a 35 y.o. male with PMH of schizoaffective, PTSD, polysubstance abuse, presents to emergency department for evaluation of presumed allergic reaction. The patient reports waking up this morning with tongue swelling and tightness in his throat. He felt like he is having difficulty breathing and called for help. He is currently hospitalized at [facility] with the mental health team. EMS reported oxygen saturation 94% on room air. He was given EpiPen injection and 50 of IM Benadryl prior to ED arrival. He feels at this time that his symptoms have improved significantly. He no longer has a sensation of tongue swelling or throat tightness. He denies any itching rash. No chest pain, abdominal pain, other Gi symptoms.   Patient believes this was a reaction to oral Haldol which he has restarted over the last 2 days. States he had a similar allergic reaction to Haldol approximately one year ago but "wasn't thinking of it" after taking it for the last several days. His last dose was 5 PM yesterday. He denies any other known allergies.    Past Medical History  Diagnosis Date  . Drug addiction (HCC)   . Asthma   . Depression   . Seizures (HCC)     drug induced  . CVA (cerebral infarction)     drug induced  . Hepatitis C     Patient Active Problem List   Diagnosis Date Noted  . Schizoaffective disorder, bipolar type (HCC) 03/20/2016  . PTSD (post-traumatic stress disorder) 03/20/2016  . Cocaine use disorder, moderate, dependence (HCC) 03/20/2016  . Stimulant use disorder (HCC) 03/20/2016  . Cannabis use disorder, moderate, dependence (HCC) 03/20/2016  . Alcohol use disorder, moderate, dependence (HCC) 03/20/2016  . Opioid use disorder, moderate, dependence (HCC) 03/20/2016  . Schizoaffective disorder  (HCC) 03/20/2016  . Hepatitis C 10/13/2013    Past Surgical History  Procedure Laterality Date  . Abdominal surgery      Current Outpatient Rx  Name  Route  Sig  Dispense  Refill  . citalopram (CELEXA) 10 MG tablet   Oral   Take 10 mg by mouth.         . DULoxetine (CYMBALTA) 60 MG capsule   Oral   Take 60 mg by mouth daily. Reported on 03/20/2016         . gabapentin (NEURONTIN) 300 MG capsule   Oral   Take 300 mg by mouth.         Marland Kitchen rOPINIRole (REQUIP) 0.25 MG tablet   Oral   Take 0.75 mg by mouth.           Allergies Haldol; Tylenol; and Risperdal  Family History  Problem Relation Age of Onset  . Diabetes Paternal Uncle   . Diabetes Paternal Grandmother   . Depression Mother   . Depression Father   . Alcoholism Other     Social History Social History  Substance Use Topics  . Smoking status: Current Every Day Smoker -- 2.00 packs/day    Types: Cigarettes  . Smokeless tobacco: None  . Alcohol Use: Yes     Comment: occ    Review of Systems  Constitutional: No fever/chills Eyes: No visual changes. ENT: No sore throat. Positive throat tightness and tongue swelling.  Cardiovascular: Denies chest pain. Respiratory: Denies shortness of breath. Gastrointestinal: No abdominal pain.  No nausea, no vomiting.  No diarrhea.  No constipation. Genitourinary: Negative for dysuria. Musculoskeletal: Negative for back pain. Skin: Negative for rash. Neurological: Negative for headaches, focal weakness or numbness.  10-point ROS otherwise negative.  ____________________________________________   PHYSICAL EXAM:  VITAL SIGNS: ED Triage Vitals  Enc Vitals Group     BP 03/20/16 0332 123/79 mmHg     Pulse Rate 03/20/16 0332 113     Resp 03/20/16 0332 18     Temp 03/20/16 0332 98.2 F (36.8 C)     Temp Source 03/20/16 0332 Oral     SpO2 03/20/16 1500 99 %     Pain Score 03/20/16 0335 10   Constitutional: Alert and oriented. Well appearing and in no  acute distress. Eyes: Conjunctivae are normal. PERRL.  Head: Atraumatic. Mouth/Throat: Mucous membranes are moist.  Oropharynx non-erythematous. No appreciable tongue swelling. Posterior oropharynx visible. No swelling at the tongue floor. Submandibular compartment is soft. Normal tone of voice. Managing oral secretions.  Neck: No stridor.   Cardiovascular: Tachycardia. Good peripheral circulation. Grossly normal heart sounds.   Respiratory: Normal respiratory effort.  No retractions. Lungs CTAB. Gastrointestinal: Soft and nontender. No distention.  Musculoskeletal: No lower extremity tenderness nor edema. No gross deformities of extremities. Neurologic:  Normal speech and language. No gross focal neurologic deficits are appreciated.  Skin:  Skin is warm, dry and intact. No rash noted. Psychiatric: Mood and affect are normal. Speech and behavior are normal. ____________________________________________   PROCEDURES  Procedure(s) performed:   Procedures  None ____________________________________________   INITIAL IMPRESSION / ASSESSMENT AND PLAN / ED COURSE  Pertinent labs & imaging results that were available during my care of the patient were reviewed by me and considered in my medical decision making (see chart for details).  Patient presented to the emergency department for evaluation of allergic reaction presumably after taking oral Haldol. Last is 5 PM yesterday. Patient reports prior history of mouth and tongue swelling with taking this medication. Review the patient's medication list does not show that he is currently on ACE/ARB. Haldol is now listed as an allergy in Epic on my review. Plan for ED observation. Will give H2 blockers and steroids in addition to EpiPen and Benadryl given with EMS.  09:57 AM Continues to rest. No acute distress.    10:30 AM Patient awake and alert. Reports feeling back to normal. States that his tongue feels normal size along with his lips.  Patient will be discharged back to his psychiatry facility. He will continue to be treated by staff there for his underlying psychiatric issues. We'll recommend steroid for the next several days along with epinephrine auto injector to be available to the patient should have a rebound reaction. I discussed this with the patient in detail.    ____________________________________________  FINAL CLINICAL IMPRESSION(S) / ED DIAGNOSES  Final diagnoses:  Allergic reaction, initial encounter     MEDICATIONS GIVEN DURING THIS VISIT:  Medications  alum & mag hydroxide-simeth (MAALOX/MYLANTA) 200-200-20 MG/5ML suspension 30 mL (not administered)  magnesium hydroxide (MILK OF MAGNESIA) suspension 30 mL (not administered)  hydrOXYzine (ATARAX/VISTARIL) tablet 25 mg (25 mg Oral Given 03/21/16 1417)  nicotine (NICODERM CQ - dosed in mg/24 hours) patch 21 mg (21 mg Transdermal Not Given 03/21/16 0800)  feeding supplement (ENSURE ENLIVE) (ENSURE ENLIVE) liquid 237 mL (237 mLs Oral Given 03/22/16 0637)  dicyclomine (BENTYL) tablet 20 mg (20 mg Oral Given 03/20/16 1557)  loperamide (IMODIUM) capsule 2-4 mg (not administered)  methocarbamol (ROBAXIN) tablet  500 mg (500 mg Oral Given 03/20/16 1552)  naproxen (NAPROSYN) tablet 500 mg (500 mg Oral Given 03/20/16 1644)  ondansetron (ZOFRAN-ODT) disintegrating tablet 4 mg (4 mg Oral Given 03/20/16 1556)  cloNIDine (CATAPRES) tablet 0.1 mg (0.1 mg Oral Not Given 03/22/16 0058)    Followed by  cloNIDine (CATAPRES) tablet 0.1 mg (not administered)    Followed by  cloNIDine (CATAPRES) tablet 0.1 mg (not administered)  LORazepam (ATIVAN) tablet 1 mg (1 mg Oral Given 03/20/16 2032)  citalopram (CELEXA) tablet 10 mg (10 mg Oral Not Given 03/21/16 0800)  haloperidol (HALDOL) 0.5 MG tablet (  Not Given 03/20/16 1151)  haloperidol lactate (HALDOL) injection 5 mg (5 mg Intramuscular Given 03/20/16 2123)  diphenhydrAMINE (BENADRYL) injection 50 mg (50 mg Intramuscular Given 03/20/16 2123)    LORazepam (ATIVAN) injection 1 mg (1 mg Intramuscular Given 03/20/16 2133)  QUEtiapine (SEROQUEL) tablet 100 mg (100 mg Oral Not Given 03/22/16 0057)  haloperidol (HALDOL) tablet 4 mg (4 mg Oral Given 03/21/16 1723)    And  benztropine (COGENTIN) tablet 0.5 mg (0.5 mg Oral Given 03/21/16 1723)  EPINEPHrine (EPI-PEN) 0.3 mg/0.3 mL injection (not administered)  EPINEPHrine (EPI-PEN) injection 0.3 mg (not administered)  cloNIDine (CATAPRES) 0.1 MG tablet (0.1 mg  Given 03/20/16 1150)  benztropine (COGENTIN) 0.5 MG tablet (0.5 mg  Given 03/20/16 1149)  predniSONE (DELTASONE) tablet 60 mg (60 mg Oral Given 03/22/16 0919)  famotidine (PEPCID) tablet 40 mg (40 mg Oral Given 03/22/16 0920)     NEW OUTPATIENT MEDICATIONS STARTED DURING THIS VISIT:  New Prescriptions   EPINEPHRINE 0.3 MG/0.3 ML IJ SOAJ INJECTION    Inject 0.3 mLs (0.3 mg total) into the muscle once.   PREDNISONE (DELTASONE) 10 MG TABLET    Take 4 tablets (40 mg total) by mouth daily.      Note:  This document was prepared using Dragon voice recognition software and may include unintentional dictation errors.  Alona BeneJoshua Zelpha Messing, MD Emergency Medicine  Maia PlanJoshua G Johnmatthew Solorio, MD 03/22/16 [phone]

## 2016-03-23 LAB — HEMOGLOBIN A1C
HEMOGLOBIN A1C: 5.7 % — AB (ref 4.8–5.6)
MEAN PLASMA GLUCOSE: 117 mg/dL
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
## 2016-03-23 NOTE — Tx Team (Signed)
Interdisciplinary Treatment Plan Update (Adult) Date: 03/23/2016   Date: 03/23/2016 8:32 AM  Progress in Treatment:  Attending groups: No Participating in groups: No Taking medication as prescribed: Yes  Tolerating medication: Yes  Family/Significant othe contact made: Yes Patient understands diagnosis: No  Limited insight Discussing patient identified problems/goals with staff: Yes  Medical problems stabilized or resolved: Yes  Denies suicidal/homicidal ideation: Yes Patient has not harmed self or Others: Yes   New problem(s) identified: None identified at this time.   Discharge Plan or Barriers: Pt is hopeful to go to [facility] after his court date on 7/18. Will need shelter bed between discharge and admission.  Additional comments:  Patient and CSW reviewed pt's identified goals and treatment plan. Patient verbalized understanding and agreed to treatment plan.   Reason for Continuation of Hospitalization:  Depression Medication stabilization Suicidal ideation Withdrawal symptoms Hallucinations  Estimated length of stay: Signed 72 hr request on 7/7.  D/C no later than 7/10.  Review of initial/current patient goals per problem list:   1.  Goal(s): Patient will participate in aftercare plan  Met:  No  Target date: 3-5 days from date of admission   As evidenced by: Patient will participate within aftercare plan AEB aftercare provider and housing plan at discharge being identified.   03/20/16: Pt is hopeful to go to [facility] after his court date on 7/18. Will need shelter bed between discharge and admission.  03/23/16:  Has not yet identified where he will go and if he will follow up with anyone.  Has ruled out shelter.  Mother will not allow him to come home.  2.  Goal (s): Patient will exhibit decreased depressive symptoms and suicidal ideations.  Met:  No  Target date: 3-5 days from date of admission   As evidenced by: Patient will utilize self rating of depression  at 3 or below and demonstrate decreased signs of depression or be deemed stable for discharge by MD. 03/20/16: Pt was admitted with symptoms of depression, rating 10/10. Pt continues to present with flat affect and depressive symptoms.  Pt will demonstrate decreased symptoms of depression and rate depression at 3/10 or lower prior to discharge. 03/23/16:  Rates depression a 5 today.  4.  Goal(s): Patient will demonstrate decreased signs of withdrawal due to substance abuse  Met:  No  Target date: 3-5 days from date of admission   As evidenced by: Patient will produce a CIWA/COWS score of 0, have stable vitals signs, and no symptoms of withdrawal  03/20/16: Pt reports symptoms of withdrawal and appears to be restless and somewhat disoriented.  03/23/16:  No signs nor symptoms of withdrawal today-continues to demonstrate medication seeking behavior.   5.  Goal(s): Patient will demonstrate decreased signs of psychosis  . Met:  No . Target date: 3-5 days from date of admission  . As evidenced by: Patient will demonstrate decreased frequency of AVH or return to baseline function   -03/20/16: Pt admitted with AVH and continues to endorse.    03/23/16:  Continues to endorse paranoia, [place]  Attendees:  Patient:    Family:    Physician: Dr. Shea Evans, MD  03/23/2016 8:32 AM  Nursing: Marilynne Halsted., RN 03/23/2016 8:32 AM  Clinical Social Worker [place], [place]  03/23/2016 8:32 AM  Other:  03/23/2016 8:32 AM  Clinical: 03/23/2016 8:32 AM  Other:  03/23/2016 8:32 AM  Other:     Ripley Fraise

## 2016-03-23 NOTE — Plan of Care (Signed)
Problem: Activity: Goal: Sleeping patterns will improve Outcome: Progressing Appears to be asleep at this time     

## 2016-03-23 NOTE — Progress Notes (Signed)
D: Patient denies SI/HI and A/V hallucinations; patient reports sleep is fair; reports appetite is fair ; reports energy level is low ; reports ability to concentrate is poor; patient reports depression and anxiety; patient reports still having withdrawal symptoms of cravings and agitation; patient reports that he wants some medication to help with his depression and Dr. Elna BreslowEappen made aware of the patient's concerns  A: Monitored q 15 minutes; patient encouraged to attend groups; patient educated about medications; patient given medications per physician orders; patient encouraged to express feelings and/or concerns  R: Patient is preoccupied with his medications; patient flat and blunted; patient is constantly asking about medications; patient's interaction with staff and peers is assertive; patient was able to set goal to talk with staff 1:1 when having feelings of SI; patient is taking medications as prescribed and tolerating medication; patient is taking a long nap; respirations are even and unlabored

## 2016-03-23 NOTE — Progress Notes (Signed)
[facility]  03/23/2016 1:20 PM John [name]  MRN:  [id number] Subjective:  Pt states " I am still anxious , I still have this tingling sensation in my throat often. I feel restless at times ."    Objective:John [name] is a 35 y.o.Caucasian male , unemployed , single , lives [place] in [place], [place] , who presented [facility] ED on a voluntary basis for worsening depressive sx as well as AH and SI .  Patient seen and chart reviewed.Discussed patient with treatment team.  Pt today seen as anxious , restless , continues to be concerned about the dystonic reaction he had yesterday. Per staff - pt is often seen as medication seeking , restless and anxious on the unit - continues to need a lot of redirection and support.      Principal Problem: Schizoaffective disorder, bipolar type (HCC) Diagnosis:   Patient Active Problem List   Diagnosis Date Noted  . Neuroleptic induced acute dystonia [G24.02] 03/22/2016  . Schizoaffective disorder, bipolar type (HCC) [F25.0] 03/20/2016  . PTSD (post-traumatic stress disorder) [F43.10] 03/20/2016  . Cocaine use disorder, moderate, dependence (HCC) [F14.20] 03/20/2016  . Stimulant use disorder (HCC) [F15.90] 03/20/2016  . Cannabis use disorder, moderate, dependence (HCC) [F12.20] 03/20/2016  . Alcohol use disorder, moderate, dependence (HCC) [F10.20] 03/20/2016  . Opioid use disorder, moderate, dependence (HCC) [F11.20] 03/20/2016  . Schizoaffective disorder (HCC) [F25.9] 03/20/2016  . Hepatitis C [B19.20] 10/13/2013   Total Time spent with patient: 25 minutes  Past Psychiatric History: Please see H&P.   Past Medical History:  Past Medical History  Diagnosis Date  . Drug addiction (HCC)   . Asthma   . Depression   . Seizures (HCC)     drug induced  . CVA (cerebral infarction)     drug induced  . Hepatitis C     Past Surgical History  Procedure Laterality Date  . Abdominal surgery     Family History:  Family  History  Problem Relation Age of Onset  . Diabetes Paternal Uncle   . Diabetes Paternal Grandmother   . Depression Mother   . Depression Father   . Alcoholism Other    Family Psychiatric  History: Please see H&P.  Social History:  History  Alcohol Use  . Yes    Comment: occ     History  Drug Use  . Yes  . Special: Marijuana, Methamphetamines, Heroin    Comment: Opana, heroin, and pain medications    Social History   Social History  . Marital Status: Single    Spouse Name: N/A  . Number of Children: N/A  . Years of Education: N/A   Social History Main Topics  . Smoking status: Current Every Day Smoker -- 2.00 packs/day    Types: Cigarettes  . Smokeless tobacco: None  . Alcohol Use: Yes     Comment: occ  . Drug Use: Yes    Special: Marijuana, Methamphetamines, Heroin     Comment: Opana, heroin, and pain medications  . Sexual Activity: Not Asked   Other Topics Concern  . None   Social History Narrative   Additional Social History:    History of alcohol / drug use?: Yes (heroin and methamphetamine use reported )                    Sleep: Fair  Appetite:  Fair  Current Medications: Current Facility-Administered Medications  Medication Dose Route Frequency Provider Last Rate Last Dose  .  alum & mag hydroxide-simeth (MAALOX/MYLANTA) 200-200-20 MG/5ML suspension 30 mL  30 mL Oral Q4H PRN Court Joy, PA-C      . benztropine (COGENTIN) tablet 1 mg  1 mg Oral QHS Jomarie Longs, MD   1 mg at 03/22/16 2108  . cloNIDine (CATAPRES) tablet 0.1 mg  0.1 mg Oral BH-qamhs Dreux Mcgroarty, MD   0.1 mg at 03/23/16 0758   Followed by  . [START ON 03/24/2016] cloNIDine (CATAPRES) tablet 0.1 mg  0.1 mg Oral QAC breakfast Durwin Davisson, MD      . dicyclomine (BENTYL) tablet 20 mg  20 mg Oral Q6H PRN Jomarie Longs, MD   20 mg at 03/20/16 1557  . diphenhydrAMINE (BENADRYL) capsule 50 mg  50 mg Oral Q6H PRN Jomarie Longs, MD   50 mg at 03/23/16 0614   Or  .  diphenhydrAMINE (BENADRYL) injection 50 mg  50 mg Intramuscular Q6H PRN Montarius Kitagawa, MD      . diphenhydrAMINE (BENADRYL) injection 50 mg  50 mg Intramuscular Once Pinchos Topel, MD      . feeding supplement (ENSURE ENLIVE) (ENSURE ENLIVE) liquid 237 mL  237 mL Oral Q24H Giannamarie Paulus, MD   237 mL at 03/22/16 0637  . hydrOXYzine (ATARAX/VISTARIL) tablet 25 mg  25 mg Oral Q6H PRN Court Joy, PA-C   25 mg at 03/23/16 0224  . loperamide (IMODIUM) capsule 2-4 mg  2-4 mg Oral PRN Jomarie Longs, MD      . LORazepam (ATIVAN) tablet 1 mg  1 mg Oral Q4H PRN Jomarie Longs, MD   1 mg at 03/23/16 1610   Or  . LORazepam (ATIVAN) injection 1 mg  1 mg Intramuscular Q4H PRN Lemarcus Baggerly, MD      . LORazepam (ATIVAN) injection 1 mg  1 mg Intramuscular Once Janyra Barillas, MD      . magnesium hydroxide (MILK OF MAGNESIA) suspension 30 mL  30 mL Oral Daily PRN Court Joy, PA-C      . methocarbamol (ROBAXIN) tablet 500 mg  500 mg Oral Q8H PRN Jomarie Longs, MD   500 mg at 03/22/16 1836  . naproxen (NAPROSYN) tablet 500 mg  500 mg Oral BID PRN Jomarie Longs, MD   500 mg at 03/22/16 2004  . nicotine (NICODERM CQ - dosed in mg/24 hours) patch 21 mg  21 mg Transdermal Daily Court Joy, PA-C   21 mg at 03/23/16 0758  . ondansetron (ZOFRAN-ODT) disintegrating tablet 4 mg  4 mg Oral Q6H PRN Jomarie Longs, MD   4 mg at 03/20/16 1556  . QUEtiapine (SEROQUEL) tablet 200 mg  200 mg Oral QHS Jomarie Longs, MD   200 mg at 03/22/16 2136  . QUEtiapine (SEROQUEL) tablet 25 mg  25 mg Oral TID PRN Jomarie Longs, MD        Lab Results:  Results for orders placed or performed during the hospital encounter of 03/20/16 (from the past 48 hour(s))  TSH     Status: None   Collection Time: 03/22/16  6:13 AM  Result Value Ref Range   TSH 0.916 0.350 - 4.500 uIU/mL    Comment: Performed at [facility]  Lipid panel     Status: Abnormal   Collection Time: 03/22/16  6:13 AM  Result Value Ref  Range   Cholesterol 177 0 - 200 mg/dL   Triglycerides 960 <454 mg/dL   HDL 42 >09 mg/dL   Total CHOL/HDL Ratio 4.2 RATIO   VLDL 22 0 -  40 mg/dL   LDL Cholesterol 161113 (H) 0 - 99 mg/dL    Comment:        Total Cholesterol/HDL:CHD Risk Coronary Heart Disease Risk Table                     Men   Women  1/2 Average Risk   3.4   3.3  Average Risk       5.0   4.4  2 X Average Risk   9.6   7.1  3 X Average Risk  23.4   11.0        Use the calculated Patient Ratio above and the CHD Risk Table to determine the patient's CHD Risk.        ATP III CLASSIFICATION (LDL):  <100     mg/dL   Optimal  [id number]  mg/dL   Near or Above                    Optimal  130-159  mg/dL   Borderline  [id number]  mg/dL   High  >914>190     mg/dL   Very High Performed at [facility]   Hemoglobin A1c     Status: Abnormal   Collection Time: 03/22/16  6:13 AM  Result Value Ref Range   Hgb A1c MFr Bld 5.7 (H) 4.8 - 5.6 %    Comment: (NOTE)         Pre-diabetes: 5.7 - 6.4         Diabetes: >6.4         Glycemic control for adults with diabetes: <7.0    Mean Plasma Glucose 117 mg/dL    Comment: (NOTE) Performed At: [facility] [address] [place], [place] [id number] Mila HomerHancock William F MD [id number] Performed at [facility]   Glucose, capillary     Status: Abnormal   Collection Time: 03/22/16  4:49 PM  Result Value Ref Range   Glucose-Capillary 138 (H) 65 - 99 mg/dL    Blood Alcohol level:  Lab Results  Component Value Date   ETH <11 08/06/2014    Metabolic Disorder Labs: Lab Results  Component Value Date   HGBA1C 5.7* 03/22/2016   MPG 117 03/22/2016   No results found for: PROLACTIN Lab Results  Component Value Date   CHOL 177 03/22/2016   TRIG 109 03/22/2016   HDL 42 03/22/2016   CHOLHDL 4.2 03/22/2016   VLDL 22 03/22/2016   LDLCALC 113* 03/22/2016    Physical Findings: AIMS: Facial and Oral Movements Muscles of Facial Expression: None,  normal Lips and Perioral Area: None, normal Jaw: None, normal Tongue: None, normal,Extremity Movements Upper (arms, wrists, hands, fingers): None, normal Lower (legs, knees, ankles, toes): None, normal, Trunk Movements Neck, shoulders, hips: None, normal, Overall Severity Severity of abnormal movements (highest score from questions above): None, normal Incapacitation due to abnormal movements: None, normal Patient's awareness of abnormal movements (rate only patient's report): No Awareness, Dental Status Current problems with teeth and/or dentures?: No Does patient usually wear dentures?: No  CIWA:  CIWA-Ar Total: 0 COWS:  COWS Total Score: 3  Musculoskeletal: Strength & Muscle Tone: within normal limits Gait & Station: normal Patient leans: N/A  Psychiatric Specialty Exam: Physical Exam  Nursing note and vitals reviewed.   Review of Systems  Constitutional: Positive for malaise/fatigue.  Psychiatric/Behavioral: Positive for depression, hallucinations and substance abuse. The patient is nervous/anxious.   All other systems reviewed and are negative.  Blood pressure 92/58, pulse 89, temperature 97.1 F (36.2 C), temperature source Oral, resp. rate 20, SpO2 99 %.There is no weight on file to calculate BMI.  General Appearance: Guarded  Eye Contact:  Fair  Speech:  Slow  Volume:  Decreased  Mood:  Anxious  Affect:  Labile  Thought Process:  Linear and Descriptions of Associations: Intact  Orientation:  Other:  person, place  Thought Content:  Delusions and Rumination reports he is paranoid and does not want to stay in his room since he feels someone is in the room.  Suicidal Thoughts:  Yes.  without intent/plan improving  Homicidal Thoughts:  denies  Memory:  Immediate;   Fair Recent;   Fair Remote;   Poor  Judgement:  Impaired  Insight:  Shallow  Psychomotor Activity:  Restlessness  Concentration:  Concentration: Fair and Attention Span: Fair  Recall:  [employer] of  Knowledge:  Fair  Language:  Fair  Akathisia:  No  Handed:  Right  AIMS (if indicated):     Assets:  Desire for Improvement  ADL's:  Intact  Cognition:  WNL  Sleep:  Number of Hours: 6.75     Treatment Plan Summary:John [name] is a 35 y.o.Caucasian male , unemployed , single , lives with mother in [place], [place] , who presented to [facility] ED on a voluntary basis for worsening depressive sx as well as AH and SI .Pt continues to be depressed, anxious and psychotic. Pt had an acute dystonic reaction to haldol  . Pt was restarted on Seroquel to target his psychosis and sleep. Pt continues to be anxious / agitated and restless often. Will continue treatment.   Daily contact with patient to assess and evaluate symptoms and progress in treatment and Medication management Will continue  seroquel  200 mg po qhs for mood sx/psychosis.Pt advised to make use of his PRN seroquel  If needed. Will consider adding Neurontin if he continues to be anxious/agitated. Will continue  clonidine protocol, ativan protocol for opioid, bzd abuse. Will continue Benadryl 50 mg IM/PO prn for EPS. Will continue to monitor vitals ,medication compliance and treatment side effects while patient is here.  Will monitor for medical issues as well as call consult as needed.  CSW will continue working on disposition. Pt is very motivated to go to a substance abuse treatment program. Patient to participate in therapeutic milieu .   John Ahlers, MD 03/23/2016, 1:20 PM

## 2016-03-23 NOTE — Progress Notes (Signed)
Nursing Note 03/23/2016 1900 - 03/24/16 0730  Data Patient being monitored inpatient for opiate and s/p alchol detox, with history of schizoaffective disorder, and SI.  Also being monitored status post dystonic reaction to Haldol 2 days ago.  No s/s detox today, denies SI HI and AVH.  Denies dystonic symptoms, does reports some anxiety and restlessness for which he has received some Ativan prior to this shift with relief.  AIMS score 0.   C/O lower back pain which he has been given Robaxin in previous shift with relief.  Requested to go to AA group tonight.  Affect blunted but appropriate, mood "anxious."  Also requests to move to 300 hall.   Action Spoke with patient 1:1, offered support throughout shift.  Order received for patient to go to AA group tonight, patient attended.  Given PRN naproxen for lower back pain, took scheduled meds as ordered.  Offered PRN Vistaril for anxiety at HS, but declined. Remained on 15 minute checks for safety.  Response Reports relief from naproxen, asleep shortly after HS med pass.  Remains safe and appropriate on unit.

## 2016-03-23 NOTE — Progress Notes (Signed)
Patient withdrew 72 hour discharge; please also see shadow chart

## 2016-03-23 NOTE — [facility] Group Notes (Signed)
[facility] LCSW Group Therapy  03/23/2016  1:05 PM  Type of Therapy:  Group therapy  Participation Level:  Active  Participation Quality:  Attentive  Affect:  Flat  Cognitive:  Oriented  Insight:  Limited  Engagement in Therapy:  Limited  Modes of Intervention:  Discussion, Socialization  Summary of Progress/Problems:  Chaplain was here to lead a group on themes of hope and courage. Invited. Chose to not attend.  Daryel Geraldorth, Camay Pedigo B 03/23/2016 12:56 PM

## 2016-03-24 MED ORDER — GABAPENTIN 300 MG PO CAPS
300.0000 mg | ORAL_CAPSULE | Freq: Three times a day (TID) | ORAL | Status: DC
  Administered 2016-03-24 – 2016-03-25 (×3): 300 mg via ORAL
  Filled 2016-03-24 (×10): qty 1

## 2016-03-24 MED ORDER — NICOTINE POLACRILEX 2 MG MT GUM
2.0000 mg | CHEWING_GUM | OROMUCOSAL | Status: DC | PRN
  Administered 2016-03-25 – 2016-03-26 (×5): 2 mg via ORAL
  Filled 2016-03-24 (×6): qty 1

## 2016-03-24 MED ORDER — QUETIAPINE FUMARATE 50 MG PO TABS
50.0000 mg | ORAL_TABLET | Freq: Three times a day (TID) | ORAL | Status: DC | PRN
  Administered 2016-03-24 – 2016-03-26 (×6): 50 mg via ORAL
  Filled 2016-03-24 (×6): qty 1

## 2016-03-24 NOTE — Progress Notes (Signed)
Patient ID: John [name], male   DOB: [birth year], 35 y.o.   MRN: [id number] [facility]  03/24/2016 3:56 PM John [name]  MRN:  [id number]  Subjective: John [name] states " I am still anxious, agitated. I feel restless at times. I can't sleep at night, I have restless leg syndrome at night. I'm having a lot of pain. Why can't I move to 300-Hall. I only hear voices when I'm coming off of Meth"   Objective:John [name] is a 35 y.o.Caucasian male , unemployed , single , lives [place], [place] , who presented to [facility] ED on a voluntary basis for worsening depressive sx as well as AH and SI .  Patient seen and chart reviewed. Discussed patient with treatment team.  Pt today seen as anxious, still  Restless. He continues to be concerned about the dystonic reaction he had yesterday. He wants Ativan scheduled four times daily. He is hoping to get into the [facility] after detox. He wants to move to the [facility]-Hall.  Per staff - pt is often seen as medication seeking , restless and anxious on the unit - continues to need a lot of redirection and support. Observed walking up & down the hall ways, not disruptive. Started on Neurontin 300 mg tid.  Principal Problem: Schizoaffective disorder, bipolar type (HCC)  Diagnosis:   Patient Active Problem List   Diagnosis Date Noted  . Neuroleptic induced acute dystonia [G24.02] 03/22/2016  . Schizoaffective disorder, bipolar type (HCC) [F25.0] 03/20/2016  . PTSD (post-traumatic stress disorder) [F43.10] 03/20/2016  . Cocaine use disorder, moderate, dependence (HCC) [F14.20] 03/20/2016  . Stimulant use disorder (HCC) [F15.90] 03/20/2016  . Cannabis use disorder, moderate, dependence (HCC) [F12.20] 03/20/2016  . Alcohol use disorder, moderate, dependence (HCC) [F10.20] 03/20/2016  . Opioid use disorder, moderate, dependence (HCC) [F11.20] 03/20/2016  . Hepatitis C [B19.20] 10/13/2013   Total Time spent with patient: 15 minutes  Past  Psychiatric History: Please see H&P.   Past Medical History:  Past Medical History  Diagnosis Date  . Drug addiction (HCC)   . Asthma   . Depression   . Seizures (HCC)     drug induced  . CVA (cerebral infarction)     drug induced  . Hepatitis C     Past Surgical History  Procedure Laterality Date  . Abdominal surgery     Family History:  Family History  Problem Relation Age of Onset  . Diabetes Paternal Uncle   . Diabetes Paternal Grandmother   . Depression Mother   . Depression Father   . Alcoholism Other    Family Psychiatric  History: Please see H&P.  Social History:  History  Alcohol Use  . Yes    Comment: occ     History  Drug Use  . Yes  . Special: Marijuana, Methamphetamines, Heroin    Comment: Opana, heroin, and pain medications    Social History   Social History  . Marital Status: Single    Spouse Name: N/A  . Number of Children: N/A  . Years of Education: N/A   Social History Main Topics  . Smoking status: Current Every Day Smoker -- 2.00 packs/day    Types: Cigarettes  . Smokeless tobacco: None  . Alcohol Use: Yes     Comment: occ  . Drug Use: Yes    Special: Marijuana, Methamphetamines, Heroin     Comment: Opana, heroin, and pain medications  . Sexual Activity: Not Asked   Other Topics  Concern  . None   Social History Narrative   Additional Social History:    History of alcohol / drug use?: Yes (heroin and methamphetamine use reported )  Sleep: Fair  Appetite:  Fair  Current Medications: Current Facility-Administered Medications  Medication Dose Route Frequency Provider Last Rate Last Dose  . alum & mag hydroxide-simeth (MAALOX/MYLANTA) 200-200-20 MG/5ML suspension 30 mL  30 mL Oral Q4H PRN Court Joy, PA-C      . benztropine (COGENTIN) tablet 1 mg  1 mg Oral QHS Jomarie Longs, MD   1 mg at 03/23/16 2117  . cloNIDine (CATAPRES) tablet 0.1 mg  0.1 mg Oral QAC breakfast Jomarie Longs, MD   0.1 mg at 03/24/16 0751  .  dicyclomine (BENTYL) tablet 20 mg  20 mg Oral Q6H PRN Jomarie Longs, MD   20 mg at 03/20/16 1557  . diphenhydrAMINE (BENADRYL) capsule 50 mg  50 mg Oral Q6H PRN Jomarie Longs, MD   50 mg at 03/23/16 0614   Or  . diphenhydrAMINE (BENADRYL) injection 50 mg  50 mg Intramuscular Q6H PRN Saramma Eappen, MD      . diphenhydrAMINE (BENADRYL) injection 50 mg  50 mg Intramuscular Once Saramma Eappen, MD      . feeding supplement (ENSURE ENLIVE) (ENSURE ENLIVE) liquid 237 mL  237 mL Oral Q24H Saramma Eappen, MD   237 mL at 03/22/16 0637  . hydrOXYzine (ATARAX/VISTARIL) tablet 25 mg  25 mg Oral Q6H PRN Court Joy, PA-C   25 mg at 03/23/16 1440  . loperamide (IMODIUM) capsule 2-4 mg  2-4 mg Oral PRN Jomarie Longs, MD      . LORazepam (ATIVAN) tablet 1 mg  1 mg Oral Q4H PRN Jomarie Longs, MD   1 mg at 03/24/16 1150   Or  . LORazepam (ATIVAN) injection 1 mg  1 mg Intramuscular Q4H PRN Saramma Eappen, MD      . LORazepam (ATIVAN) injection 1 mg  1 mg Intramuscular Once Saramma Eappen, MD      . magnesium hydroxide (MILK OF MAGNESIA) suspension 30 mL  30 mL Oral Daily PRN Court Joy, PA-C      . methocarbamol (ROBAXIN) tablet 500 mg  500 mg Oral Q8H PRN Jomarie Longs, MD   500 mg at 03/24/16 1124  . naproxen (NAPROSYN) tablet 500 mg  500 mg Oral BID PRN Jomarie Longs, MD   500 mg at 03/23/16 2003  . nicotine (NICODERM CQ - dosed in mg/24 hours) patch 21 mg  21 mg Transdermal Daily Court Joy, PA-C   21 mg at 03/24/16 1610  . ondansetron (ZOFRAN-ODT) disintegrating tablet 4 mg  4 mg Oral Q6H PRN Jomarie Longs, MD   4 mg at 03/20/16 1556  . QUEtiapine (SEROQUEL) tablet 200 mg  200 mg Oral QHS Jomarie Longs, MD   200 mg at 03/23/16 2116  . QUEtiapine (SEROQUEL) tablet 25 mg  25 mg Oral TID PRN Jomarie Longs, MD   25 mg at 03/24/16 1404    Lab Results:  Results for orders placed or performed during the hospital encounter of 03/20/16 (from the past 48 hour(s))  Glucose, capillary      Status: Abnormal   Collection Time: 03/22/16  4:49 PM  Result Value Ref Range   Glucose-Capillary 138 (H) 65 - 99 mg/dL    Blood Alcohol level:  Lab Results  Component Value Date   [facility] <11 08/06/2014    Metabolic Disorder Labs: Lab Results  Component Value Date  HGBA1C 5.7* 03/22/2016   MPG 117 03/22/2016   No results found for: PROLACTIN Lab Results  Component Value Date   CHOL 177 03/22/2016   TRIG 109 03/22/2016   HDL 42 03/22/2016   CHOLHDL 4.2 03/22/2016   VLDL 22 03/22/2016   LDLCALC 113* 03/22/2016    Physical Findings: AIMS: Facial and Oral Movements Muscles of Facial Expression: None, normal Lips and Perioral Area: None, normal Jaw: None, normal Tongue: None, normal,Extremity Movements Upper (arms, wrists, hands, fingers): None, normal Lower (legs, knees, ankles, toes): None, normal, Trunk Movements Neck, shoulders, hips: None, normal, Overall Severity Severity of abnormal movements (highest score from questions above): None, normal Incapacitation due to abnormal movements: None, normal Patient's awareness of abnormal movements (rate only patient's report): No Awareness, Dental Status Current problems with teeth and/or dentures?: No Does patient usually wear dentures?: No  CIWA:  CIWA-Ar Total: 2 COWS:  COWS Total Score: 2  Musculoskeletal: Strength & Muscle Tone: within normal limits Gait & Station: normal Patient leans: N/A  Psychiatric Specialty Exam: Physical Exam  Nursing note and vitals reviewed.   Review of Systems  Constitutional: Positive for malaise/fatigue.  Psychiatric/Behavioral: Positive for depression, hallucinations and substance abuse. The patient is nervous/anxious.   All other systems reviewed and are negative.   Blood pressure 116/76, pulse 72, temperature 97.1 F (36.2 C), temperature source Oral, resp. rate 20, SpO2 99 %.There is no weight on file to calculate BMI.  General Appearance: Guarded  Eye Contact:  Fair  Speech:   Slow  Volume:  Decreased  Mood:  Anxious  Affect:  Labile  Thought Process:  Linear and Descriptions of Associations: Intact  Orientation:  Other:  person, place  Thought Content:  Delusions and Rumination reports he is paranoid and does not want to stay in his room since he feels someone is in the room.  Suicidal Thoughts:  Yes.  without intent/plan improving  Homicidal Thoughts:  denies  Memory:  Immediate;   Fair Recent;   Fair Remote;   Poor  Judgement:  Impaired  Insight:  Shallow  Psychomotor Activity:  Restlessness  Concentration:  Concentration: Fair and Attention Span: Fair  Recall:  [employer] of Knowledge:  Fair  Language:  Fair  Akathisia:  No  Handed:  Right  AIMS (if indicated):     Assets:  Desire for Improvement  ADL's:  Intact  Cognition:  WNL  Sleep:  Number of Hours: 5.25   Treatment Plan Summary:John [name] is a 35 y.o.Caucasian male , unemployed , single , lives with mother in [place], [place] , who presented to [facility] ED on a voluntary basis for worsening depressive sx as well as AH and SI .Pt continues to be depressed, anxious and psychotic. Pt had an acute dystonic reaction to haldol  . Pt was restarted on Seroquel to target his psychosis and sleep.Patient continues to be anxious / agitated and restless often. Will continue treatment.  Daily contact with patient to assess and evaluate symptoms and progress in treatment and Medication management Will continue  The Seroquel  200 mg po qhs for mood sx/psychosis. Increased the PRN Seroquel from 25 mg to 50 mg bid for agitation. Initaited Neurontin 300 mg tid for agitation.. Will continue  clonidine protocol/ativan protocol for opioid & Benzodiazepine detox.. Will continue Benadryl 50 mg IM/PO prn for EPS. Will continue to monitor vitals ,medication compliance and treatment side effects while patient is here.  Will monitor for medical issues as well as call consult  as needed.  CSW will continue  working on disposition. Pt is very motivated to go to a substance abuse treatment program. Patient to participate in therapeutic milieu .   Sanjuana Kava, NP, PMHNP, FNP-BC 03/24/2016, 3:56 PM    I reviewed chart and agreed with the findings and treatment Plan.  Kathryne Sharper, MD

## 2016-03-24 NOTE — [facility] Group Notes (Signed)
Adult Psychoeducational Group Note  Date:  03/24/2016 Time:  12:44 PM  Group Topic/Focus:  [facility]: Healthy Coping Skills  Participation Level:  Did Not Attend  Participation Quality:    Affect:  Cognitive:    Insight:  Engagement in Group:   Modes of Intervention:    Additional Comments:    Barton Fannyyson, Tamaria Dunleavy M 03/24/2016, 12:44 PM

## 2016-03-24 NOTE — [facility] Group Notes (Signed)
[facility] Group Notes:  (Clinical Social Work)  03/24/2016  11:15-12:00PM  Summary of Progress/Problems:   Today's process group involved patients discussing things/behaviors in their lives that may have led to them being hospitalized at this time, as well as specific actions they could possibly take to prevent future hospitalizations. The list was large and varied, reflecting each group member's contributions.  Many questions were generated regarding diagnosis, drug use and impact on mental illness, and the similarities/differences between mental health issues and physical health issues. The patient expressed some resentment that he is still on the 500 hall after coming down from the substances he was using and no longer having hallucinations.  He participated fully in the discussion, showed ambivalence multiple times that was turned with Motivational Interviewing.  He was in and out of the room multiple times.  Type of Therapy:  Group Therapy - Process  Participation Level:  Active  Participation Quality:  Attentive and Sharing  Affect:  Defensive and Depressed  Cognitive:  Appropriate and Oriented  Insight:  Developing/Improving  Engagement in Therapy:  Developing/Improving  Modes of Intervention:  Exploration, Discussion, Motivational Interviewing  Ambrose MantleMareida Grossman-Orr, LCSW 03/24/2016, 1:05 PM

## 2016-03-24 NOTE — Progress Notes (Signed)
D: Patient denies SI/HI or AVH but states "I am paranoid".   Patient has a anxious affect and is preoccupied with medication availability.  Pt. States "I shouldn't even be here, I don't hear or see things".  He states that he feels that now that the "drugs are out of my system, my ADHD is worse and I can't be still".   A: Patient given emotional support from RN and encouraged to utilize coping skills. Patient encouraged to come to staff with concerns and/or questions. Patient's medication routine continued. Patient's orders and plan of care reviewed.   R: Patient remains appropriate and cooperative. Will continue to monitor patient q15 minutes for safety.

## 2016-03-25 MED ORDER — IBUPROFEN 600 MG PO TABS
600.0000 mg | ORAL_TABLET | Freq: Four times a day (QID) | ORAL | Status: DC | PRN
  Administered 2016-03-25: 600 mg via ORAL
  Filled 2016-03-25: qty 1

## 2016-03-25 MED ORDER — GABAPENTIN 400 MG PO CAPS
400.0000 mg | ORAL_CAPSULE | Freq: Three times a day (TID) | ORAL | Status: DC
  Administered 2016-03-25 – 2016-03-26 (×5): 400 mg via ORAL
  Filled 2016-03-25: qty 1
  Filled 2016-03-25 (×2): qty 28
  Filled 2016-03-25 (×3): qty 1
  Filled 2016-03-25: qty 28
  Filled 2016-03-25 (×2): qty 1
  Filled 2016-03-25 (×4): qty 28
  Filled 2016-03-25 (×5): qty 1
  Filled 2016-03-25: qty 28
  Filled 2016-03-25 (×2): qty 1

## 2016-03-25 NOTE — Progress Notes (Signed)
Patient ID: John FlockRobert [name], male   DOB: Feb 24, 1981, 35 y.o.   MRN: [id number] Patient ID: John FlockRobert [name], male   DOB: Feb 24, 1981, 35 y.o.   MRN: [id number] [facility] Note  03/25/2016 2:34 PM John [name]  MRN:  [id number]  Subjective: John [name] states " I am anxious, I feel agitated. I don't like to be on this 500-hall. I need to be moved to the 300-hall or discharge me today  Objective: John [name] is a 35 y.o.Caucasian male , unemployed , single , [place], [place] , who presented [facility] ED on a voluntary basis for worsening depressive sx as well as AH and SI .  Patient seen and chart reviewed. Discussed patient with treatment team.  Pt today seen as anxious, still  Restless. He continues to endorse high anxiety symptoms. He wants Ativan scheduled four times daily. He is hoping to get into the [facility] after detox. He wants to move to the 300-Hall or get discharged as he says does not like to be on the 500-hall.  Per staff - pt is often seen as medication seeking , restless and anxious on the unit - continues to need a lot of redirection and support. Observed walking up & down the hall ways, not disruptive. Started on Neurontin 300 mg tid yeasterday. So far tolerating his medications. May decide to go home tomorrow.  Principal Problem: Schizoaffective disorder, bipolar type (HCC)  Diagnosis:   Patient Active Problem List   Diagnosis Date Noted  . Neuroleptic induced acute dystonia [G24.02] 03/22/2016  . Schizoaffective disorder, bipolar type (HCC) [F25.0] 03/20/2016  . PTSD (post-traumatic stress disorder) [F43.10] 03/20/2016  . Cocaine use disorder, moderate, dependence (HCC) [F14.20] 03/20/2016  . Stimulant use disorder (HCC) [F15.90] 03/20/2016  . Cannabis use disorder, moderate, dependence (HCC) [F12.20] 03/20/2016  . Alcohol use disorder, moderate, dependence (HCC) [F10.20] 03/20/2016  . Opioid use disorder, moderate, dependence (HCC) [F11.20] 03/20/2016   . Hepatitis C [B19.20] 10/13/2013   Total Time spent with patient: 15 minutes  Past Psychiatric History: Please see H&P.   Past Medical History:  Past Medical History  Diagnosis Date  . Drug addiction (HCC)   . Asthma   . Depression   . Seizures (HCC)     drug induced  . CVA (cerebral infarction)     drug induced  . Hepatitis C     Past Surgical History  Procedure Laterality Date  . Abdominal surgery     Family History:  Family History  Problem Relation Age of Onset  . Diabetes Paternal Uncle   . Diabetes Paternal Grandmother   . Depression Mother   . Depression Father   . Alcoholism Other    Family Psychiatric  History: Please see H&P.  Social History:  History  Alcohol Use  . Yes    Comment: occ     History  Drug Use  . Yes  . Special: Marijuana, Methamphetamines, Heroin    Comment: Opana, heroin, and pain medications    Social History   Social History  . Marital Status: Single    Spouse Name: N/A  . Number of Children: N/A  . Years of Education: N/A   Social History Main Topics  . Smoking status: Current Every Day Smoker -- 2.00 packs/day    Types: Cigarettes  . Smokeless tobacco: None  . Alcohol Use: Yes     Comment: occ  . Drug Use: Yes    Special: Marijuana, Methamphetamines, Heroin  Comment: Opana, heroin, and pain medications  . Sexual Activity: Not Asked   Other Topics Concern  . None   Social History Narrative   Additional Social History:    History of alcohol / drug use?: Yes (heroin and methamphetamine use reported )  Sleep: Fair  Appetite:  Fair  Current Medications: Current Facility-Administered Medications  Medication Dose Route Frequency Provider Last Rate Last Dose  . alum & mag hydroxide-simeth (MAALOX/MYLANTA) 200-200-20 MG/5ML suspension 30 mL  30 mL Oral Q4H PRN Court Joy, PA-C      . benztropine (COGENTIN) tablet 1 mg  1 mg Oral QHS Jomarie Longs, MD   1 mg at 03/24/16 2050  . diphenhydrAMINE  (BENADRYL) capsule 50 mg  50 mg Oral Q6H PRN Jomarie Longs, MD   50 mg at 03/23/16 0614   Or  . diphenhydrAMINE (BENADRYL) injection 50 mg  50 mg Intramuscular Q6H PRN Saramma Eappen, MD      . diphenhydrAMINE (BENADRYL) injection 50 mg  50 mg Intramuscular Once Saramma Eappen, MD      . feeding supplement (ENSURE ENLIVE) (ENSURE ENLIVE) liquid 237 mL  237 mL Oral Q24H Saramma Eappen, MD   237 mL at 03/25/16 0749  . gabapentin (NEURONTIN) capsule 300 mg  300 mg Oral TID Sanjuana Kava, NP   300 mg at 03/25/16 1125  . hydrOXYzine (ATARAX/VISTARIL) tablet 25 mg  25 mg Oral Q6H PRN Court Joy, PA-C   25 mg at 03/25/16 1011  . LORazepam (ATIVAN) tablet 1 mg  1 mg Oral Q4H PRN Jomarie Longs, MD   1 mg at 03/25/16 0749   Or  . LORazepam (ATIVAN) injection 1 mg  1 mg Intramuscular Q4H PRN Saramma Eappen, MD      . LORazepam (ATIVAN) injection 1 mg  1 mg Intramuscular Once Saramma Eappen, MD      . magnesium hydroxide (MILK OF MAGNESIA) suspension 30 mL  30 mL Oral Daily PRN Court Joy, PA-C      . nicotine polacrilex (NICORETTE) gum 2 mg  2 mg Oral PRN Jomarie Longs, MD   2 mg at 03/25/16 1125  . QUEtiapine (SEROQUEL) tablet 200 mg  200 mg Oral QHS Saramma Eappen, MD   200 mg at 03/24/16 2050  . QUEtiapine (SEROQUEL) tablet 50 mg  50 mg Oral TID PRN Sanjuana Kava, NP   50 mg at 03/25/16 2956    Lab Results:  No results found for this or any previous visit (from the past 48 hour(s)).  Blood Alcohol level:  Lab Results  Component Value Date   [facility] <11 08/06/2014    Metabolic Disorder Labs: Lab Results  Component Value Date   HGBA1C 5.7* 03/22/2016   MPG 117 03/22/2016   No results found for: PROLACTIN Lab Results  Component Value Date   CHOL 177 03/22/2016   TRIG 109 03/22/2016   HDL 42 03/22/2016   CHOLHDL 4.2 03/22/2016   VLDL 22 03/22/2016   LDLCALC 113* 03/22/2016    Physical Findings: AIMS: Facial and Oral Movements Muscles of Facial Expression: None, normal Lips  and Perioral Area: None, normal Jaw: None, normal Tongue: None, normal,Extremity Movements Upper (arms, wrists, hands, fingers): None, normal Lower (legs, knees, ankles, toes): None, normal, Trunk Movements Neck, shoulders, hips: None, normal, Overall Severity Severity of abnormal movements (highest score from questions above): None, normal Incapacitation due to abnormal movements: None, normal Patient's awareness of abnormal movements (rate only patient's report): No Awareness, Dental Status Current  problems with teeth and/or dentures?: No Does patient usually wear dentures?: No  CIWA:  CIWA-Ar Total: 7 COWS:  COWS Total Score: 3  Musculoskeletal: Strength & Muscle Tone: within normal limits Gait & Station: normal Patient leans: N/A  Psychiatric Specialty Exam: Physical Exam  Nursing note and vitals reviewed.   Review of Systems  Constitutional: Positive for malaise/fatigue.  Psychiatric/Behavioral: Positive for depression, hallucinations and substance abuse. The patient is nervous/anxious.   All other systems reviewed and are negative.   Blood pressure 114/70, pulse 81, temperature 98.3 F (36.8 C), temperature source Oral, resp. rate 16, SpO2 99 %.There is no weight on file to calculate BMI.  General Appearance: Guarded  Eye Contact:  Fair  Speech:  Slow  Volume:  Decreased  Mood:  Anxious  Affect:  Labile  Thought Process:  Linear and Descriptions of Associations: Intact  Orientation:  Other:  person, place  Thought Content:  Delusions and Rumination reports he is paranoid and does not want to stay in his room since he feels someone is in the room.  Suicidal Thoughts:  Yes.  without intent/plan improving  Homicidal Thoughts:  denies  Memory:  Immediate;   Fair Recent;   Fair Remote;   Poor  Judgement:  Impaired  Insight:  Shallow  Psychomotor Activity:  Restlessness  Concentration:  Concentration: Fair and Attention Span: Fair  Recall:  [employer] of Knowledge:   Fair  Language:  Fair  Akathisia:  No  Handed:  Right  AIMS (if indicated):     Assets:  Desire for Improvement  ADL's:  Intact  Cognition:  WNL  Sleep:  Number of Hours: 6.25   Treatment Plan Summary:Kyrese Fults is a 35 y.o.Caucasian male , unemployed , single , lives with mother in [place], [place] , who presented to [facility] ED on a voluntary basis for worsening depressive sx as well as AH and SI .Pt continues to be depressed, anxious and psychotic. Pt had an acute dystonic reaction to haldol  . Pt was restarted on Seroquel to target his psychosis and sleep.Patient continues to be anxious / agitated and restless often. Will continue treatment.  Daily contact with patient to assess and evaluate symptoms and progress in treatment and Medication management Will continue  The Seroquel  200 mg po qhs for mood sx/psychosis. Increased the PRN Seroquel from 25 mg to 50 mg bid for agitation. Initaited Neurontin 300 mg tid for agitation.. Will continue  clonidine protocol/ativan protocol for opioid & Benzodiazepine detox.. Will continue Benadryl 50 mg IM/PO prn for EPS. Will continue to monitor vitals ,medication compliance and treatment side effects while patient is here.  Will monitor for medical issues as well as call consult as needed.  CSW will continue working on disposition. Pt is very motivated to go to a substance abuse treatment program. Patient to participate in therapeutic milieu .   Sanjuana Kava, NP, PMHNP, FNP-BC 03/25/2016, 2:34 PM  I reviewed chart and agreed with the findings and treatment Plan.  Kathryne Sharper, MD

## 2016-03-25 NOTE — [facility] Group Notes (Signed)
[facility] Group Notes: (Clinical Social Work)   03/25/2016      Type of Therapy:  Group Therapy   Participation Level:  Did Not Attend despite MHT prompting   Gentry Seeber Grossman-Orr, LCSW 03/25/2016, 2:30 PM     

## 2016-03-25 NOTE — Progress Notes (Signed)
[facility] Group Notes:  (Nursing/MHT/Case Management/Adjunct)  Date:  03/25/2016  Time:  12:01 AM  Type of Therapy:  Psychoeducational Skills  Participation Level:  Active  Participation Quality:  Monopolizing  Affect:  Angry and Irritable  Cognitive:  Disorganized  Insight:  Lacking  Engagement in Group:  Distracting, Off Topic and Poor  Modes of Intervention:  Education  Summary of Progress/Problems: The patient arrived late for group and became quite upset when it was his turn to speak. First of all, he verbalized that he had a signed a 72 hour request for discharge and wanted to go home. Secondly, he mentioned that he does not belong on this hallway and should be transferred to the 300 hallway. He did not take too kindly when this Thereasa Parkinauthor suggested that he was admitted to this hallway due to the way he presented himself on admission. He then became upset and stated that he was "hearing things" on admission because he had not slept in 14 days. He also stated repeatedly that he came into the hospital to be "detoxed" and that he was not in the hospital so that he could be prescribed psychiatric medication. In addiition, the patient stated that the doctor was just simply "throwing medication" at him and that prescribing him Seroquel was not the answer for all of his problems. He cited that he took Haldol the other day and that it caused him to having side effects. The patient then verbalized that the only way for him to get what he wanted was to "spit in the doctor's face" and to hit people and "throw furniture around". This author encouraged him to use coping skills such as talking to the staff but he refused. The patient eventually left group. Approximately two hours after the group concluded, the patient apologized to this author.   Mikalah Skyles S 03/25/2016, 12:01 AM

## 2016-03-25 NOTE — Progress Notes (Signed)
Nursing Note 03/24/2016 1900 - 03/25/16 0730  Data  Patient being monitored inpatient for opiate and s/p alchol detox, with history of schizoaffective disorder, and SI.  Also being monitored status post severe dystonic reaction to Haldol 3 days ago.  No s/s detox today, denies SI HI and AVH. Denies dystonic symptoms.  C/O ongoing lower back pain which is being treated effectively with PRN Robaxin and Naproxen.  Patient labile this evening- calm and appropriate before group therapy, after group therapy very agitated (see group therapy note.)  Patient came out of group therapy yelling "I'm going to beat the shit out of that guy" threatening "I'm going to break his nose" in reference to the technician who ran the group.  Verbally abrasive, patient was allowed time to vent his feelings.  Patient also stated "I just need some heroine, give me something for the detox, I need to see a doctor tonight."   Action Patient was offered PRN ativan PO and seroquel, initally said "just give me the ativan shot, that will work better.  They gave me one the other night"  Nurse set limits and told patient he could have the Seroquel and/or Ativan PO, or we could work on other ways to reduce anxiety. He said "if I don't get a shot I can't be responsible for someone getting their nose broken."  Nurse continued setting limits, de-escalating patient.   Patient took PRN seroquel and ativan PO, and was given his HS meds slightly early (including 200mg  seroquel). Was given Robaxin for back pain.   Response Patient observed sitting calmly in dayroom shortly after taking medicine, then shortly after that observed laying in bed with eyes closed, even non-labored breathing.

## 2016-03-25 NOTE — Progress Notes (Signed)
Seab rates Anxiety 6/10 and Depression 10/10. He states his goal today was not to have an outburst. When I inquired as to the reason for his outburst he states "because somebody was being ignorant today and I don't have the patience for it". When questioned about SI he states "I will be if they don't take me off this unit". Denies HI/AVH. Encouragement and support given. Medications administered as prescribed. Will continue to monitor for patient safety and medication effectiveness. Q15 minute checks for patient safety.

## 2016-03-25 NOTE — Progress Notes (Signed)
D: John MaduroRobert has been voiced frustration with not being able to get medications as frequently as he would like this a.m. He also thinks he should be on the 300 hall and dismisses rationales given as to why he is not. He avails himself of PRNs when possible, including Ativan, Seroquel, Vistaril, and nicotine gum. Pt is minimally receptive to nonjudgmental discussion of alternative means of coping. Pt is currently resting with eyes closed. On his self inventory, he reported good sleep, poor appetite, hyper energy levels, and poor concentration. He rated his depression 8/10, feeling of hopelessness 9/10, and anxiety 10/10. He reported his goal is to "stay in control of myself." A: Meds given as ordered. Q15 safety checks maintained. Support/encouragement offered. R: Pt remains free from harm and continues with treatment. Will continue to monitor for needs/safety.

## 2016-03-25 NOTE — Progress Notes (Signed)
Adult Psychoeducational Group Note  Date:  03/25/2016 Time:  8:20 PM  Group Topic/Focus:  Wrap-Up Group:   The focus of this group is to help patients review their daily goal of treatment and discuss progress on daily workbooks.  Participation Level:  Did Not Attend  Pt was asleep during wrap-up group.    Cleotilde NeerJasmine S Pavle Wiler 03/25/2016, 9:06 PM

## 2016-03-25 NOTE — B[facility] Group Notes (Signed)
[facility] Group Notes:  (Nursing/MHT/Case Management/Adjunct)  Date:  03/25/2016  Time:  1030  Type of Therapy:  Nurse Education  Participation Level:  Active  Participation Quality:  Appropriate  Affect:  Appropriate  Cognitive:  Appropriate and Oriented  Insight:  Appropriate  Engagement in Group:  Engaged  Modes of Intervention:  Discussion, Education and Support  Summary of Progress/Problems: John MaduroRobert was attentive and shared during group about his problems controlling his anger. He contributed to the discussion where warranted. Insight shown.  John SimmeringShugart, John [name] M 03/25/2016, 11:55 AM

## 2016-03-26 MED ORDER — GABAPENTIN 400 MG PO CAPS: 400.0000 mg | ORAL_CAPSULE | Freq: Three times a day (TID) | ORAL | Status: DC

## 2016-03-26 MED ORDER — QUETIAPINE FUMARATE 200 MG PO TABS: 200.0000 mg | ORAL_TABLET | Freq: Every day | ORAL | Status: DC

## 2016-03-26 MED ORDER — NICOTINE POLACRILEX 2 MG MT GUM: 2.0000 mg | CHEWING_GUM | OROMUCOSAL | Status: DC | PRN

## 2016-03-26 MED ORDER — HYDROXYZINE HCL 25 MG PO TABS: 25.0000 mg | ORAL_TABLET | Freq: Four times a day (QID) | ORAL | Status: DC | PRN

## 2016-03-26 MED ORDER — BENZTROPINE MESYLATE 1 MG PO TABS: 1.0000 mg | ORAL_TABLET | Freq: Every day | ORAL | Status: DC

## 2016-03-26 NOTE — Progress Notes (Signed)
Patient discharged to lobby. Patient was stable and appreciative at that time. All papers, samples and prescriptions were given and valuables returned. Verbal understanding expressed. Denies SI/HI and A/VH. Patient given opportunity to express concerns and ask questions.  

## 2016-03-26 NOTE — [facility] Suicide Risk Assessment (Addendum)
[facility]   Principal Problem: Schizoaffective disorder, bipolar type [facility](HCC) Discharge Diagnoses:  Patient Active Problem List   Diagnosis Date Noted  . Neuroleptic induced acute dystonia [G24.02] 03/22/2016  . Schizoaffective disorder, bipolar type (HCC) [F25.0] 03/20/2016  . PTSD (post-traumatic stress disorder) [F43.10] 03/20/2016  . Cocaine use disorder, moderate, dependence (HCC) [F14.20] 03/20/2016  . Stimulant use disorder (HCC) [F15.90] 03/20/2016  . Cannabis use disorder, moderate, dependence (HCC) [F12.20] 03/20/2016  . Alcohol use disorder, moderate, dependence (HCC) [F10.20] 03/20/2016  . Opioid use disorder, moderate, dependence (HCC) [F11.20] 03/20/2016  . Hepatitis C [B19.20] 10/13/2013    Total Time spent with patient: 30 minutes  Musculoskeletal: Strength & Muscle Tone: within normal limits Gait & Station: normal Patient leans: N/A  Psychiatric Specialty Exam: ROS denies headache, denies shortness of breath, denies chest pain, no nausea or vomiting   Blood pressure 113/66, pulse 104, temperature 98.6 F (37 C), temperature source Oral, resp. rate 20, SpO2 99 %.There is no weight on file to calculate BMI.  General Appearance: improved grooming   Eye Contact::  Good  Speech:  Normal Rate409  Volume:  Normal  Mood:  mood improved - currently states mood is "OK", and minimizes depression   Affect:  fuller in range, smiles at times appropriately   Thought Process:  Linear  Orientation:  Full (Time, Place, and Person)  Thought Content:  denies hallucinations, no delusions, does not appear internally preoccupied   Suicidal Thoughts:  No- at this time denies any suicidal ideations, denies any self injurious ideations   Homicidal Thoughts:  No- denies any homicidal or violent ideations   Memory:  recent and remote grossly intact   Judgement:  Other:  improving   Insight:  improving   Psychomotor Activity:  no psychomotor restlessness or  agitation   Concentration:  Good  Recall:  Good  Fund of Knowledge:Good  Language: Good  Akathisia:  Negative  Handed:  Right  AIMS (if indicated):   at this time no dystonia or abnormal involuntary movements noted or reported, no akathisia   Assets:  Communication Skills Desire for Improvement Resilience  Sleep:  Number of Hours: 5.75  Cognition: WNL  ADL's:  Intact   Mental Status Per Nursing Assessment::   On Admission:     Demographic Factors:  35 year old single, unemployed, male - living with mother   Loss Factors: Losses , stressors, related to substance abuse   Historical Factors: History of methamphetamine dependence - has been diagnosed with schizophrenia in the past   Risk Reduction Factors:   Sense of responsibility to family, Positive social support and Positive coping skills or problem solving skills  Continued Clinical Symptoms:  At present patient is alert, attentive, better related, mood is reported as improved and denies depression, affect is reactive, and smiles at times appropriately, no thought disorder, no suicidal or homicidal ideations, no psychotic symptoms, future oriented . Patient tolerating medications well at this time- he has history of dystonic reaction to some antipsychotics in the past, but has tolerated Seroquel well, and at this time has no EPS .   Cognitive Features That Contribute To Risk:  No gross cognitive deficits noted upon discharge. Is alert , attentive, and oriented x 3  Suicide Risk:  Mild:  Suicidal ideation of limited frequency, intensity, duration, and specificity.  There are no identifiable plans, no associated intent, mild dysphoria and related symptoms, good self-control (both objective and subjective assessment), few other risk factors, and identifiable  protective factors, including available and accessible social support.  Follow-up Information    Follow up with [facility]   Specialty:   Emergency Medicine   Why:  If symptoms worsen   Contact information:   [address] [id number] [address] [address] [phone]      Follow up with [facility] On 04/02/2016.   Why:  Screening for admission on 7/17 at 8 AM.  Must be prepared to admit that day and must provide proof of residency in [facility] at admission.  Bring a weeks worth of clothing and blanket/pillow.    Contact information:   [address] [place] [address] [phone]       Follow up with [facility] On 03/28/2016.   Why:  Hospital discharge follow up appointmenton 7/12 at 12:15 PM.  Please call to cancel/reschedule if needed.  Use walk in hours if needed.  Mon-Fri 8:30 - 3 PM   Contact information:   [address] [place], [place] [address]  Hours: Mon-Fri 8AM to 5PM  Phone: [phone]  Fax: [phone]      Plan Of Care/Follow-up recommendations:  Activity:  as tolerated  Diet:  Regular  Tests:  NA Other:  see below   Patient is requesting discharge and there are no current grounds for involuntary commitment  He states his sister will come pick him up later today, and this has been corroborated , with patient's consent by CSW, who communicated with sister. Family will take him to [facility] on 7/17 and to [facility] on 7/12. Patient encouraged to avoid people,places and situations he associates with drug abuse, in order to minimize risk of relapse  Encouraged to go to NA meetings   Nehemiah Massed, MD 03/26/2016, 4:28 PM

## 2016-03-26 NOTE — Clinical Social Work Note (Signed)
Patient will admit to [facility] at 10 AM on July 21.  Cannot be on any benzodiazepines or pain medications, could have Vistaril for anxiety if needed.    Santa GeneraAnne Derold Dorsch, LCSW Lead Clinical Social Worker Phone:  [phone]

## 2016-03-26 NOTE — Progress Notes (Signed)
DAR NOTE: Patient presents with anxious affect and depressed mood.  Denies pain, auditory and visual hallucinations.  Rates depression at 9, hopelessness at 10, and anxiety at 10.  Maintained on routine safety checks.  Medications given as prescribed.  Support and encouragement offered as needed.  Attended group and participated.  States goal for today is "stay calm."  Patient was verbally aggressive and abusive as he was demanding to be discharged home secondary to family reasons.  Patient requested and signed 72 hours discharge form.  Patient still irritable, agitated and verbally abusive to family members on the phone.

## 2016-03-26 NOTE — Progress Notes (Signed)
  [facility] Discharge [facility] :  Will you be returning to the same living situation after discharge:  No. At discharge, do you have transportation home?: Yes,  w sister Do you have the ability to pay for your medications: Yes,  no concerns expressed by patient.   Release of information consent forms completed and in the chart;  Patient's signature needed at discharge.  Patient to Follow up at: Follow-up Information    Follow up with [facility].   Specialty:  Emergency Medicine   Why:  If symptoms worsen   Contact information:   [id number] [place] [place] [id number] [phone]      Follow up with [facility] On 03/28/2016.   Why:  Hospital discharge follow up appointmenton 7/12 at 12:15 PM.  Please call to cancel/reschedule if needed.  Use walk in hours if needed.  Mon-Fri 8:30 - 3 PM   Contact information:   [address] [place], [place] [address]  Hours: Mon-Fri 8AM to 5PM  Phone: [phone]  Fax: [phone]      Next level of care provider has access to [facility] Link:no  Safety Planning and Suicide Prevention discussed: Yes,  SPE reviewed w patient and sister   Have you used any form of tobacco in the last 30 days? (Cigarettes, Smokeless Tobacco, Cigars, and/or Pipes): Yes  Has patient been referred to the Quitline?: Patient refused referral  Patient has been referred for addiction treatment: Yes, [facility].   Sallee LangeCunningham, Anne C 03/26/2016, 4:48 PM

## 2016-03-26 NOTE — Clinical Social Work Note (Addendum)
Patient requesting discharge to go w sister and family until admitted to [facility].  Asked CSW to call sister, Duwayne HeckDanielle at [phone].  CSW attempted to contact sister and was unable to do so.  MD aware of patient request for discharge.  Patient also requests [facility] [place].    Santa GeneraAnne Tamecia Mcdougald, LCSW Lead Clinical Social Worker Phone:  [phone]

## 2016-03-26 NOTE — [facility] Group Notes (Signed)
[facility] Note   03/26/2016 10:46 AM  Participation Quality:  Limited, irritable, angry  Mood/Affect:  Anxious, angry  Plan for Discharge/Comments:  Wants to go to 28 day rehab but cannot admit until 7/21.  Concerned he will relapse on heroin if returns to current living situation.  Has court date on 7/17, lawyer involved.  Was encouraged to identify safe discharge plan to bridge time from discharge to [facility].  Concerned that he dc w "all my medications", worried that he will not be able to remain on them.  Transportation Means: Pt encouraged to identify transportation options  Supports:  "I have no family to speak of", "all my friends use drugs", limited.  Did state intention to attend 12 step recovery groups daily upon discharge  Sallee LangeCunningham, Anne C

## 2016-03-26 NOTE — [facility] Group Notes (Signed)
[facility] LCSW Group Therapy  03/26/2016 5:19 PM  Type of Therapy:  Group Therapy  Participation Level:  Active  Participation Quality:  Appropriate and Attentive  Affect:  Appropriate  Cognitive:  Alert  Insight:  Developing/Improving  Engagement in Therapy:  Developing/Improving  Modes of Intervention:  Discussion, Exploration, Socialization and Support  Summary of Progress/Problems:  Today's Topic: Overcoming Obstacles. Patients identified one short term goal and potential obstacles in reaching this goal. Patients processed barriers involved in overcoming these obstacles. Patients identified steps necessary for overcoming these obstacles and explored motivation (internal and external) for facing these difficulties head on. Pt used "Positive Thoughts" cards to discuss ways to overcome life challenges, pair shared their thoughts w peers on unit and then participated in large group discussion of ways to overcome challenges.  Anne Cunningham, LCSW Lead Clinical Social Worker Phone:  [phone]      Cunningham, Anne C 03/26/2016, 5:19 PM  

## 2016-03-26 NOTE — Discharge Summary (Signed)
Physician Discharge Summary Note  Patient:  John [name] is an 35 y.o., male MRN:  [id number] DOB:  03/22/1981 Patient phone:  [phone] (home)  Patient address:   [address] [place] [address],  Total Time spent with patient: 30 minutes  Date of Admission:  03/20/2016 Date of Discharge: 03/26/2016  Reason for Admission:  Suicidal ideations  Principal Problem: Schizoaffective disorder, bipolar type [facility](HCC) Discharge Diagnoses: Patient Active Problem List   Diagnosis Date Noted  . Neuroleptic induced acute dystonia [G24.02] 03/22/2016  . Schizoaffective disorder, bipolar type (HCC) [F25.0] 03/20/2016  . PTSD (post-traumatic stress disorder) [F43.10] 03/20/2016  . Cocaine use disorder, moderate, dependence (HCC) [F14.20] 03/20/2016  . Stimulant use disorder (HCC) [F15.90] 03/20/2016  . Cannabis use disorder, moderate, dependence (HCC) [F12.20] 03/20/2016  . Alcohol use disorder, moderate, dependence (HCC) [F10.20] 03/20/2016  . Opioid use disorder, moderate, dependence (HCC) [F11.20] 03/20/2016  . Hepatitis C [B19.20] 10/13/2013    Past Psychiatric History: see HPI  Past Medical History:  Past Medical History  Diagnosis Date  . Drug addiction (HCC)   . Asthma   . Depression   . Seizures (HCC)     drug induced  . CVA (cerebral infarction)     drug induced  . Hepatitis C     Past Surgical History  Procedure Laterality Date  . Abdominal surgery     Family History:  Family History  Problem Relation Age of Onset  . Diabetes Paternal Uncle   . Diabetes Paternal Grandmother   . Depression Mother   . Depression Father   . Alcoholism Other    Family Psychiatric  History: see HPI Social History:  History  Alcohol Use  . Yes    Comment: occ     History  Drug Use  . Yes  . Special: Marijuana, Methamphetamines, Heroin    Comment: Opana, heroin, and pain medications    Social History   Social History  . Marital Status: Single    Spouse Name: N/A  .  Number of Children: N/A  . Years of Education: N/A   Social History Main Topics  . Smoking status: Current Every Day Smoker -- 2.00 packs/day    Types: Cigarettes  . Smokeless tobacco: None  . Alcohol Use: Yes     Comment: occ  . Drug Use: Yes    Special: Marijuana, Methamphetamines, Heroin     Comment: Opana, heroin, and pain medications  . Sexual Activity: Not Asked   Other Topics Concern  . None   Social History Narrative    Hospital Course:   John FlockRobert [name] was admitted for Schizoaffective disorder, bipolar type [facility](HCC) and crisis management.  He was treated with medications and their indications listed below .  Medical problems were identified and treated as needed.  Home medications were restarted as appropriate.  Improvement was monitored by observation and John [name] of symptom reduction.  Emotional and mental status was monitored by daily self inventory reports completed by John [name] and clinical staff.  Patient reported continued improvement, denied any new concerns.  Patient had been compliant on medications and denied side effects.  Support and encouragement was provided.    At time of discharge, patient rated both depression and anxiety levels to be manageable and minimal.  Patient encouraged to attend groups to help with recognizing triggers of emotional crises and de-stabilizations.  Patient encouraged to attend group to help identify the positive things in life that would help in dealing with feelings of  loss, depression and unhealthy or abusive tendencies.         John [name] was evaluated by the treatment team for stability and plans for continued recovery upon discharge.  He was offered further treatment options upon discharge including Residential, Intensive Outpatient and Outpatient treatment. He will follow up with agencies listed below for medication management and counseling.  Encouraged patient to maintain satisfactory support network and home  environment.  Advised to adhere to medication compliance and outpatient treatment follow up.  Prescriptions provided.       John [name] motivation was an integral factor for scheduling further treatment.  Employment, transportation, bed availability, health status, family support, and any pending legal issues were also considered during his hospital stay.  Upon completion of this admission the patient was both mentally and medically stable for discharge denying suicidal/homicidal ideation, auditory/visual/tactile hallucinations, delusional thoughts and paranoia.      Physical Findings: AIMS: Facial and Oral Movements Muscles of Facial Expression: None, normal Lips and Perioral Area: None, normal Jaw: None, normal Tongue: None, normal,Extremity Movements Upper (arms, wrists, hands, fingers): None, normal Lower (legs, knees, ankles, toes): None, normal, Trunk Movements Neck, shoulders, hips: None, normal, Overall Severity Severity of abnormal movements (highest score from questions above): None, normal Incapacitation due to abnormal movements: None, normal Patient's awareness of abnormal movements (rate only patient's [name]): No Awareness, Dental Status Current problems with teeth and/or dentures?: No Does patient usually wear dentures?: No  CIWA:  CIWA-Ar Total: 0 COWS:  COWS Total Score: 1  Musculoskeletal: Strength & Muscle Tone: within normal limits Gait & Station: normal Patient leans: N/A  Psychiatric Specialty Exam:  See MD SRA Physical Exam  Nursing note and vitals reviewed.   Review of Systems  All other systems reviewed and are negative.   Blood pressure 113/66, pulse 104, temperature 98.6 F (37 C), temperature source Oral, resp. rate 20, SpO2 99 %.There is no weight on file to calculate BMI.    Have you used any form of tobacco in the last 30 days? (Cigarettes, Smokeless Tobacco, Cigars, and/or Pipes): Yes  Has this patient used any form of tobacco in the last 30  days? (Cigarettes, Smokeless Tobacco, Cigars, and/or Pipes) Yes, N/A  Blood Alcohol level:  Lab Results  Component Value Date   [facility] <11 08/06/2014    Metabolic Disorder Labs:  Lab Results  Component Value Date   HGBA1C 5.7* 03/22/2016   MPG 117 03/22/2016   No results found for: PROLACTIN Lab Results  Component Value Date   CHOL 177 03/22/2016   TRIG 109 03/22/2016   HDL 42 03/22/2016   CHOLHDL 4.2 03/22/2016   VLDL 22 03/22/2016   LDLCALC 113* 03/22/2016    See Psychiatric Specialty Exam and Suicide Risk Assessment completed by Attending Physician prior to discharge.  Discharge destination:  Home  Is patient on multiple antipsychotic therapies at discharge:  No   Has Patient had three or more failed trials of antipsychotic monotherapy by history:  No  Recommended Plan for Multiple Antipsychotic Therapies: NA     Medication List    STOP taking these medications        citalopram 10 MG tablet  Commonly known as:  CELEXA     DULoxetine 60 MG capsule  Commonly known as:  CYMBALTA     rOPINIRole 0.25 MG tablet  Commonly known as:  REQUIP      TAKE these medications      Indication   benztropine 1 MG tablet  Commonly known as:  COGENTIN  Take 1 tablet (1 mg total) by mouth at bedtime.   Indication:  Extrapyramidal Reaction caused by Medications     EPINEPHrine 0.3 mg/0.3 mL Soaj injection  Commonly known as:  EPI-PEN  Inject 0.3 mLs (0.3 mg total) into the muscle once.      gabapentin 400 MG capsule  Commonly known as:  NEURONTIN  Take 1 capsule (400 mg total) by mouth 4 (four) times daily - after meals and at bedtime.   Indication:  Agitation     hydrOXYzine 25 MG tablet  Commonly known as:  ATARAX/VISTARIL  Take 1 tablet (25 mg total) by mouth every 6 (six) hours as needed for anxiety.   Indication:  Anxiety Neurosis     nicotine polacrilex 2 MG gum  Commonly known as:  NICORETTE  Take 1 each (2 mg total) by mouth as needed for smoking cessation.    Indication:  Nicotine Addiction     QUEtiapine 200 MG tablet  Commonly known as:  SEROQUEL  Take 1 tablet (200 mg total) by mouth at bedtime.   Indication:  mood stabilization       Follow-up Information    Follow up with [facility]   Specialty:  Emergency Medicine   Why:  If symptoms worsen   Contact information:   [id number] [place] [address] [phone]      Follow up with [facility] On 04/02/2016.   Why:  Screening for admission on 7/17 at 8 AM.  Must be prepared to admit that day and must provide proof of residency in [facility] at admission.  Bring a weeks worth of clothing and blanket/pillow.    Contact information:   [address] [place] [address] [phone]       Follow up with [facility] On 03/28/2016.   Why:  Hospital discharge follow up appointmenton 7/12 at 12:15 PM.  Please call to cancel/reschedule if needed.  Use walk in hours if needed.  Mon-Fri 8:30 - 3 PM   Contact information:   [address] [place], [place] [address]  Hours: Mon-Fri 8AM to 5PM  Phone: [phone]  Fax: [phone]      Follow-up recommendations:  Activity:  as tol Diet:  as tol  Comments:  1.  Take all your medications as prescribed.   2.  [name] any adverse side effects to outpatient provider. 3.  Patient instructed to not use alcohol or illegal drugs while on prescription medicines. 4.  In the event of worsening symptoms, instructed patient to call 911, the crisis hotline or go to nearest emergency room for evaluation of symptoms.  Signed: Lindwood Qua, NP [facility] 03/26/2016, 2:58 PM   Patient seen, Suicide Assessment Completed.  Disposition Plan Reviewed

## 2016-03-26 NOTE — [facility] Suicide Risk Assessment (Signed)
[facility] INPATIENT:  Family/Significant Other Suicide Prevention Education  Suicide Prevention Education:  Education Completed; Doristine MangoDanielle Matlin, sister, [phone],  (name of family member/significant other) has been identified by the patient as the family member/significant other with whom the patient will be residing, and identified as the person(s) who will aid the patient in the event of a mental health crisis (suicidal ideations/suicide attempt).  With written consent from the patient, the family member/significant other has been provided the following suicide prevention education, prior to the and/or following the discharge of the patient.  The suicide prevention education provided includes the following:  Suicide risk factors  Suicide prevention and interventions  National Suicide Hotline telephone number  [facility] assessment telephone number  [facility] Emergency Assistance 911  [facility] and/or Residential Mobile Crisis Unit telephone number  Request made of family/significant other to:  Remove weapons (e.g., guns, rifles, knives), all items previously/currently identified as safety concern.    Remove drugs/medications (over-the-counter, prescriptions, illicit drugs), all items previously/currently identified as a safety concern.  The family member/significant other verbalizes understanding of the suicide prevention education information provided.  The family member/significant other agrees to remove the items of safety concern listed above.  Sallee LangeCunningham, Anne C 03/26/2016, 1:17 PM

## 2017-09-18 ENCOUNTER — Emergency Department (HOSPITAL_COMMUNITY): Payer: Self-pay

## 2017-09-18 ENCOUNTER — Other Ambulatory Visit: Payer: Self-pay

## 2017-09-18 ENCOUNTER — Inpatient Hospital Stay (HOSPITAL_COMMUNITY)
Admission: EM | Admit: 2017-09-18 | Discharge: 2017-09-25 | DRG: 580 | Disposition: A | Discharge status: Home / Self Care | Payer: Self-pay | Attending: Family Medicine | Admitting: Family Medicine

## 2017-09-18 ENCOUNTER — Encounter (HOSPITAL_COMMUNITY): Payer: Self-pay

## 2017-09-18 DIAGNOSIS — F32A Depression, unspecified: Secondary | ICD-10-CM

## 2017-09-18 DIAGNOSIS — Z833 Family history of diabetes mellitus: Secondary | ICD-10-CM

## 2017-09-18 DIAGNOSIS — F1010 Alcohol abuse, uncomplicated: Secondary | ICD-10-CM | POA: Diagnosis present

## 2017-09-18 DIAGNOSIS — F1510 Other stimulant abuse, uncomplicated: Secondary | ICD-10-CM | POA: Diagnosis present

## 2017-09-18 DIAGNOSIS — Z915 Personal history of self-harm: Secondary | ICD-10-CM

## 2017-09-18 DIAGNOSIS — F329 Major depressive disorder, single episode, unspecified: Secondary | ICD-10-CM

## 2017-09-18 DIAGNOSIS — T6592XA Toxic effect of unspecified substance, intentional self-harm, initial encounter: Secondary | ICD-10-CM

## 2017-09-18 DIAGNOSIS — Z818 Family history of other mental and behavioral disorders: Secondary | ICD-10-CM

## 2017-09-18 DIAGNOSIS — B1920 Unspecified viral hepatitis C without hepatic coma: Secondary | ICD-10-CM | POA: Diagnosis present

## 2017-09-18 DIAGNOSIS — F1910 Other psychoactive substance abuse, uncomplicated: Secondary | ICD-10-CM | POA: Diagnosis present

## 2017-09-18 DIAGNOSIS — F1410 Cocaine abuse, uncomplicated: Secondary | ICD-10-CM | POA: Diagnosis present

## 2017-09-18 DIAGNOSIS — F419 Anxiety disorder, unspecified: Secondary | ICD-10-CM | POA: Diagnosis present

## 2017-09-18 DIAGNOSIS — F1914 Other psychoactive substance abuse with psychoactive substance-induced mood disorder: Secondary | ICD-10-CM | POA: Diagnosis present

## 2017-09-18 DIAGNOSIS — Z8673 Personal history of transient ischemic attack (TIA), and cerebral infarction without residual deficits: Secondary | ICD-10-CM

## 2017-09-18 DIAGNOSIS — B182 Chronic viral hepatitis C: Secondary | ICD-10-CM | POA: Diagnosis present

## 2017-09-18 DIAGNOSIS — B9562 Methicillin resistant Staphylococcus aureus infection as the cause of diseases classified elsewhere: Secondary | ICD-10-CM | POA: Diagnosis present

## 2017-09-18 DIAGNOSIS — Z590 Homelessness: Secondary | ICD-10-CM

## 2017-09-18 DIAGNOSIS — L03114 Cellulitis of left upper limb: Secondary | ICD-10-CM | POA: Diagnosis present

## 2017-09-18 DIAGNOSIS — Z811 Family history of alcohol abuse and dependence: Secondary | ICD-10-CM

## 2017-09-18 DIAGNOSIS — Z813 Family history of other psychoactive substance abuse and dependence: Secondary | ICD-10-CM

## 2017-09-18 DIAGNOSIS — F1210 Cannabis abuse, uncomplicated: Secondary | ICD-10-CM | POA: Diagnosis present

## 2017-09-18 DIAGNOSIS — F1110 Opioid abuse, uncomplicated: Secondary | ICD-10-CM | POA: Diagnosis present

## 2017-09-18 DIAGNOSIS — K219 Gastro-esophageal reflux disease without esophagitis: Secondary | ICD-10-CM | POA: Diagnosis present

## 2017-09-18 DIAGNOSIS — R45851 Suicidal ideations: Secondary | ICD-10-CM | POA: Diagnosis present

## 2017-09-18 DIAGNOSIS — G4700 Insomnia, unspecified: Secondary | ICD-10-CM | POA: Diagnosis present

## 2017-09-18 DIAGNOSIS — J45909 Unspecified asthma, uncomplicated: Secondary | ICD-10-CM | POA: Diagnosis present

## 2017-09-18 DIAGNOSIS — B999 Unspecified infectious disease: Secondary | ICD-10-CM

## 2017-09-18 DIAGNOSIS — F250 Schizoaffective disorder, bipolar type: Secondary | ICD-10-CM | POA: Diagnosis present

## 2017-09-18 DIAGNOSIS — Z8709 Personal history of other diseases of the respiratory system: Secondary | ICD-10-CM

## 2017-09-18 DIAGNOSIS — F17210 Nicotine dependence, cigarettes, uncomplicated: Secondary | ICD-10-CM | POA: Diagnosis present

## 2017-09-18 DIAGNOSIS — L02414 Cutaneous abscess of left upper limb: Principal | ICD-10-CM | POA: Diagnosis present

## 2017-09-18 DIAGNOSIS — F1994 Other psychoactive substance use, unspecified with psychoactive substance-induced mood disorder: Secondary | ICD-10-CM

## 2017-09-18 LAB — CBC WITH DIFFERENTIAL/PLATELET
BASOS PCT: 0 %
Basophils Absolute: 0 10*3/uL (ref 0.0–0.1)
Eosinophils Absolute: 0.1 10*3/uL (ref 0.0–0.7)
Eosinophils Relative: 1 %
HEMATOCRIT: 39 % (ref 39.0–52.0)
Hemoglobin: 13.8 g/dL (ref 13.0–17.0)
LYMPHS PCT: 15 %
Lymphs Abs: 1.7 10*3/uL (ref 0.7–4.0)
MCH: 32.5 pg (ref 26.0–34.0)
MCHC: 35.4 g/dL (ref 30.0–36.0)
MCV: 91.8 fL (ref 78.0–100.0)
MONOS PCT: 8 %
Monocytes Absolute: 0.9 10*3/uL (ref 0.1–1.0)
NEUTROS ABS: 8.3 10*3/uL — AB (ref 1.7–7.7)
NEUTROS PCT: 76 %
Platelets: 251 10*3/uL (ref 150–400)
RBC: 4.25 MIL/uL (ref 4.22–5.81)
RDW: 13.5 % (ref 11.5–15.5)
WBC: 11 10*3/uL — ABNORMAL HIGH (ref 4.0–10.5)

## 2017-09-18 LAB — RAPID URINE DRUG SCREEN, HOSP PERFORMED
AMPHETAMINES: POSITIVE — AB
BENZODIAZEPINES: NOT DETECTED
Barbiturates: NOT DETECTED
Cocaine: NOT DETECTED
Opiates: POSITIVE — AB
TETRAHYDROCANNABINOL: NOT DETECTED

## 2017-09-18 LAB — ACETAMINOPHEN LEVEL: Acetaminophen (Tylenol), Serum: <10 ug/mL — ABNORMAL LOW (ref 10–30)

## 2017-09-18 LAB — COMPREHENSIVE METABOLIC PANEL
ALBUMIN: 4.1 g/dL (ref 3.5–5.0)
ALK PHOS: 75 U/L (ref 38–126)
ALT: 47 U/L (ref 17–63)
ANION GAP: 12 (ref 5–15)
AST: 38 U/L (ref 15–41)
BILIRUBIN TOTAL: 1.1 mg/dL (ref 0.3–1.2)
BUN: 10 mg/dL (ref 6–20)
CALCIUM: 9 mg/dL (ref 8.9–10.3)
CO2: 24 mmol/L (ref 22–32)
Chloride: 99 mmol/L — ABNORMAL LOW (ref 101–111)
Creatinine, Ser: 0.98 mg/dL (ref 0.61–1.24)
GFR calc Af Amer: >60 mL/min (ref >60)
GFR calc non Af Amer: >60 mL/min (ref >60)
GLUCOSE: 93 mg/dL (ref 65–99)
Potassium: 3.5 mmol/L (ref 3.5–5.1)
Sodium: 135 mmol/L (ref 135–145)
TOTAL PROTEIN: 7.8 g/dL (ref 6.5–8.1)

## 2017-09-18 LAB — ETHANOL

## 2017-09-18 LAB — SALICYLATE LEVEL: Salicylate Lvl: 9.9 mg/dL (ref 2.8–30.0)

## 2017-09-18 MED ORDER — VANCOMYCIN HCL 10 G IV SOLR
2000.0000 mg | Freq: Once | INTRAVENOUS | Status: AC
  Administered 2017-09-18: 2000 mg via INTRAVENOUS
  Filled 2017-09-18: qty 2000

## 2017-09-18 MED ORDER — PROCHLORPERAZINE EDISYLATE 5 MG/ML IJ SOLN
10.0000 mg | Freq: Once | INTRAMUSCULAR | Status: AC
  Administered 2017-09-18: 10 mg via INTRAVENOUS
  Filled 2017-09-18: qty 2

## 2017-09-18 MED ORDER — LORAZEPAM 1 MG PO TABS
1.0000 mg | ORAL_TABLET | Freq: Four times a day (QID) | ORAL | Status: DC | PRN
  Administered 2017-09-18 – 2017-09-19 (×4): 1 mg via ORAL
  Filled 2017-09-18 (×4): qty 1

## 2017-09-18 MED ORDER — IBUPROFEN 200 MG PO TABS
600.0000 mg | ORAL_TABLET | Freq: Three times a day (TID) | ORAL | Status: DC
  Administered 2017-09-18 – 2017-09-19 (×3): 600 mg via ORAL
  Filled 2017-09-18 (×4): qty 3

## 2017-09-18 MED ORDER — ACETAMINOPHEN 325 MG PO TABS
650.0000 mg | ORAL_TABLET | Freq: Four times a day (QID) | ORAL | Status: DC | PRN
  Administered 2017-09-18 – 2017-09-23 (×6): 650 mg via ORAL
  Filled 2017-09-18 (×6): qty 2

## 2017-09-18 MED ORDER — HEPARIN SODIUM (PORCINE) 5000 UNIT/ML IJ SOLN
5000.0000 [IU] | Freq: Three times a day (TID) | INTRAMUSCULAR | Status: DC
  Administered 2017-09-18 – 2017-09-25 (×19): 5000 [IU] via SUBCUTANEOUS
  Filled 2017-09-18 (×20): qty 1

## 2017-09-18 MED ORDER — LIDOCAINE-EPINEPHRINE (PF) 2 %-1:200000 IJ SOLN
10.0000 mL | Freq: Once | INTRAMUSCULAR | Status: AC
  Administered 2017-09-18: 10 mL
  Filled 2017-09-18: qty 20

## 2017-09-18 MED ORDER — PANTOPRAZOLE SODIUM 40 MG PO TBEC
40.0000 mg | DELAYED_RELEASE_TABLET | Freq: Every day | ORAL | Status: DC
  Administered 2017-09-18 – 2017-09-25 (×8): 40 mg via ORAL
  Filled 2017-09-18 (×8): qty 1

## 2017-09-18 MED ORDER — ONDANSETRON HCL 4 MG PO TABS
4.0000 mg | ORAL_TABLET | Freq: Four times a day (QID) | ORAL | Status: DC | PRN
  Administered 2017-09-20: 4 mg via ORAL
  Filled 2017-09-18: qty 1

## 2017-09-18 MED ORDER — SODIUM CHLORIDE 0.9 % IV SOLN
INTRAVENOUS | Status: DC
  Administered 2017-09-18 – 2017-09-25 (×5): via INTRAVENOUS

## 2017-09-18 MED ORDER — ACETAMINOPHEN 650 MG RE SUPP: 650.0000 mg | Freq: Four times a day (QID) | RECTAL | Status: DC | PRN

## 2017-09-18 MED ORDER — KETOROLAC TROMETHAMINE 15 MG/ML IJ SOLN
15.0000 mg | Freq: Once | INTRAMUSCULAR | Status: AC
  Administered 2017-09-18: 15 mg via INTRAVENOUS
  Filled 2017-09-18: qty 1

## 2017-09-18 MED ORDER — THIAMINE HCL 100 MG/ML IJ SOLN: 100.0000 mg | Freq: Every day | INTRAMUSCULAR | Status: DC

## 2017-09-18 MED ORDER — ONDANSETRON HCL 4 MG/2ML IJ SOLN
4.0000 mg | Freq: Four times a day (QID) | INTRAMUSCULAR | Status: DC | PRN
  Administered 2017-09-18 – 2017-09-21 (×4): 4 mg via INTRAVENOUS
  Filled 2017-09-18 (×3): qty 2

## 2017-09-18 MED ORDER — ADULT MULTIVITAMIN W/MINERALS CH
1.0000 | ORAL_TABLET | Freq: Every day | ORAL | Status: DC
  Administered 2017-09-18 – 2017-09-25 (×8): 1 via ORAL
  Filled 2017-09-18 (×8): qty 1

## 2017-09-18 MED ORDER — LORAZEPAM 2 MG/ML IJ SOLN: 1.0000 mg | Freq: Four times a day (QID) | INTRAMUSCULAR | Status: DC | PRN

## 2017-09-18 MED ORDER — FOLIC ACID 1 MG PO TABS
1.0000 mg | ORAL_TABLET | Freq: Every day | ORAL | Status: DC
  Administered 2017-09-18 – 2017-09-25 (×8): 1 mg via ORAL
  Filled 2017-09-18 (×8): qty 1

## 2017-09-18 MED ORDER — NICOTINE 14 MG/24HR TD PT24
14.0000 mg | MEDICATED_PATCH | Freq: Every day | TRANSDERMAL | Status: DC
  Administered 2017-09-18 – 2017-09-19 (×2): 14 mg via TRANSDERMAL
  Filled 2017-09-18 (×2): qty 1

## 2017-09-18 MED ORDER — VANCOMYCIN HCL IN DEXTROSE 1-5 GM/200ML-% IV SOLN: 1000.0000 mg | Freq: Once | INTRAVENOUS | Status: DC

## 2017-09-18 MED ORDER — VITAMIN B-1 100 MG PO TABS
100.0000 mg | ORAL_TABLET | Freq: Every day | ORAL | Status: DC
  Administered 2017-09-18 – 2017-09-25 (×8): 100 mg via ORAL
  Filled 2017-09-18 (×8): qty 1

## 2017-09-18 MED ORDER — VANCOMYCIN HCL 10 G IV SOLR
1250.0000 mg | Freq: Two times a day (BID) | INTRAVENOUS | Status: DC
  Administered 2017-09-18 – 2017-09-19 (×2): 1250 mg via INTRAVENOUS
  Filled 2017-09-18 (×2): qty 1250

## 2017-09-18 NOTE — Progress Notes (Signed)
Pharmacy Antibiotic Note  John [name] is a 37 y.o. male admitted on 09/18/2017 with cellulitis.  Pharmacy has been consulted for vancomycin dosing.  Patient is IV drug user with L arm cellulitis and abscess.   Today, 09/18/2017   SCr = 0.98  No height weight in computer.  [facility] 77kg a year [facility]  No fevers  S/p I&D in ED  Plan:  Vancomycin 1250mg  IV q12h  Daily SCR since on vanco and scheduled   Check levels if remains on vancomycin > 4-5 days   Temp (24hrs), Avg:97.9 F (36.6 C), Min:97.9 F (36.6 C), Max:97.9 F (36.6 C)  Recent Labs  Lab 09/18/17 0648  WBC 11.0*  CREATININE 0.98    CrCl cannot be calculated (Unknown ideal weight.).    Allergies  Allergen Reactions  . Haldol [Haloperidol Lactate] Swelling  . Tylenol [Acetaminophen] Other (See Comments)    unknown  . Risperdal [Risperidone] Anxiety    dyskinesia  dyskinesia     Antimicrobials this admission: 1/2 >> vanco >>  Dose adjustments this admission:   Microbiology results: 1/2 BCx:  1/2 wound cx:   Thank you for allowing pharmacy to be a part of this patient's care.  Juliette Alcideustin Beatrix Breece, PharmD, BCPS.   Pager: [phone] 09/18/2017 10:05 AM

## 2017-09-18 NOTE — ED Provider Notes (Signed)
[facility] Provider Note: John Dell, MD, FACEP  CSN: [id number] MRN: [id number] ARRIVAL: 09/18/17 at 0409 ROOM: 1507/1507-01   CHIEF COMPLAINT  Arm Infection and Suicidal   HISTORY OF PRESENT ILLNESS  09/18/17 6:43 AM John [name] is a 37 y.o. male with a history of polysubstance abuse and hepatitis C.  He has a tender, erythematous area of the left arm which began in his left antecubital fossa.  He denies this being a drug injection site as he usually injects into his right arm or neck.  He states he was scratching his skin due to severe itching and excoriated it; he admits to being under the influence of drugs when this occurred.  This occurred 2 or 3 days ago.  He is subsequently developed worsening redness, swelling and purulent exudate.  He rates the pain in his arm as a 9 out of 10, worse with movement or palpation.  The patient admits that he tried to kill himself with heroin overdose yesterday but was rescued with Narcan by a bystander.  He admits to continued suicidal ideation due to a recent resumption of drug abuse.  Specifically he admits to heroin, methamphetamine and cannabis abuse.  He denies alcohol abuse.  Past Medical History:  Diagnosis Date  . Asthma   . CVA (cerebral infarction)    drug induced  . Depression   . Drug addiction (HCC)   . Hepatitis C   . Seizures (HCC)    drug induced    Past Surgical History:  Procedure Laterality Date  . ABDOMINAL SURGERY      Family History  Problem Relation Age of Onset  . Diabetes Paternal Uncle   . Diabetes Paternal Grandmother   . Depression Mother   . Depression Father   . Alcoholism Other     Social History   Tobacco Use  . Smoking status: Current Every Day Smoker    Packs/day: 2.00    Types: Cigarettes  . Smokeless tobacco: Never Used  Substance Use Topics  . Alcohol use: Yes    Comment: occ  . Drug use: Yes    Types: Marijuana, Methamphetamines, Heroin    Comment: Opana, heroin,  and pain medications    Prior to Admission medications   Medication Sig Start Date End Date Taking? Authorizing Provider  benztropine (COGENTIN) 1 MG tablet Take 1 tablet (1 mg total) by mouth at bedtime. Patient not taking: Reported on 09/18/2017 03/26/16   Adonis Brook, NP  EPINEPHrine 0.3 mg/0.3 mL IJ SOAJ injection Inject 0.3 mLs (0.3 mg total) into the muscle once. 03/22/16   Long, Arlyss Repress, MD  gabapentin (NEURONTIN) 400 MG capsule Take 1 capsule (400 mg total) by mouth 4 (four) times daily - after meals and at bedtime. Patient not taking: Reported on 09/18/2017 03/26/16   Adonis Brook, NP  hydrOXYzine (ATARAX/VISTARIL) 25 MG tablet Take 1 tablet (25 mg total) by mouth every 6 (six) hours as needed for anxiety. Patient not taking: Reported on 09/18/2017 03/26/16   Adonis Brook, NP  nicotine polacrilex (NICORETTE) 2 MG gum Take 1 each (2 mg total) by mouth as needed for smoking cessation. Patient not taking: Reported on 09/18/2017 03/26/16   Adonis Brook, NP  QUEtiapine (SEROQUEL) 200 MG tablet Take 1 tablet (200 mg total) by mouth at bedtime. Patient not taking: Reported on 09/18/2017 03/26/16   Adonis Brook, NP    Allergies Haldol [haloperidol lactate]; Tylenol [acetaminophen]; and Risperdal [risperidone]   REVIEW OF SYSTEMS  Negative except  as noted here or in the History of Present Illness.   PHYSICAL EXAMINATION  Initial Vital Signs Blood pressure 131/83, pulse (!) 102, temperature 97.9 F (36.6 C), temperature source Oral, resp. rate 19, SpO2 100 %.  Examination General: Well-developed, well-nourished male in no acute distress; appearance consistent with age of record HENT: normocephalic; atraumatic Eyes: pupils equal, round and reactive to light; extraocular muscles intact Neck: supple Heart: regular rate and rhythm Lungs: clear to auscultation bilaterally Abdomen: soft; nondistended; tender, mildly enlarged liver; bowel sounds present Extremities: No deformity; full  range of motion; pulses normal Neurologic: Awake, alert and oriented; motor function intact in all extremities and symmetric; no facial droop Skin: Warm and dry; tender, erythematous mid left upper arm with areas of crusting and induration:         Psychiatric: Normal mood and affect   RESULTS  Summary of this visit's results, reviewed by myself:   EKG Interpretation  Date/Time:    Ventricular Rate:    PR Interval:    QRS Duration:   QT Interval:    QTC Calculation:   R Axis:     Text Interpretation:        Laboratory Studies: Results for orders placed or performed during the hospital encounter of 09/18/17 (from the past 24 hour(s))  Rapid urine drug screen (hospital performed)     Status: Abnormal   Collection Time: 09/18/17  6:48 AM  Result Value Ref Range   Opiates POSITIVE (A) NONE DETECTED   Cocaine NONE DETECTED NONE DETECTED   Benzodiazepines NONE DETECTED NONE DETECTED   Amphetamines POSITIVE (A) NONE DETECTED   Tetrahydrocannabinol NONE DETECTED NONE DETECTED   Barbiturates NONE DETECTED NONE DETECTED  Ethanol     Status: None   Collection Time: 09/18/17  6:48 AM  Result Value Ref Range   Alcohol, Ethyl (B) <10 <10 mg/dL  Wound or Superficial Culture     Status: None (Preliminary result)   Collection Time: 09/18/17  6:48 AM  Result Value Ref Range   Specimen Description ABSCESS LEFT ARM    Special Requests NONE    Gram Stain      RARE WBC PRESENT, PREDOMINANTLY PMN FEW GRAM POSITIVE COCCI IN PAIRS IN CLUSTERS Performed at [facility], [address], [place], [place] [address]    Culture PENDING    Report Status PENDING   CBC with Differential/Platelet     Status: Abnormal   Collection Time: 09/18/17  6:48 AM  Result Value Ref Range   WBC 11.0 (H) 4.0 - 10.5 K/uL   RBC 4.25 4.22 - 5.81 MIL/uL   Hemoglobin 13.8 13.0 - 17.0 g/dL   HCT 60.4 54.0 - 98.1 %   MCV 91.8 78.0 - 100.0 fL   MCH 32.5 26.0 - 34.0 pg   MCHC 35.4 30.0 - 36.0 g/dL    RDW 19.1 47.8 - 29.5 %   Platelets 251 150 - 400 K/uL   Neutrophils Relative % 76 %   Neutro Abs 8.3 (H) 1.7 - 7.7 K/uL   Lymphocytes Relative 15 %   Lymphs Abs 1.7 0.7 - 4.0 K/uL   Monocytes Relative 8 %   Monocytes Absolute 0.9 0.1 - 1.0 K/uL   Eosinophils Relative 1 %   Eosinophils Absolute 0.1 0.0 - 0.7 K/uL   Basophils Relative 0 %   Basophils Absolute 0.0 0.0 - 0.1 K/uL  Comprehensive metabolic panel     Status: Abnormal   Collection Time: 09/18/17  6:48 AM  Result Value Ref Range   Sodium 135 135 - 145 mmol/L   Potassium 3.5 3.5 - 5.1 mmol/L   Chloride 99 (L) 101 - 111 mmol/L   CO2 24 22 - 32 mmol/L   Glucose, Bld 93 65 - 99 mg/dL   BUN 10 6 - 20 mg/dL   Creatinine, Ser 4.090.98 0.61 - 1.24 mg/dL   Calcium 9.0 8.9 - 81.110.3 mg/dL   Total Protein 7.8 6.5 - 8.1 g/dL   Albumin 4.1 3.5 - 5.0 g/dL   AST 38 15 - 41 U/L   ALT 47 17 - 63 U/L   Alkaline Phosphatase 75 38 - 126 U/L   Total Bilirubin 1.1 0.3 - 1.2 mg/dL   GFR calc non Af Amer >60 >60 mL/min   GFR calc Af Amer >60 >60 mL/min   Anion gap 12 5 - 15  Salicylate level     Status: None   Collection Time: 09/18/17  6:48 AM  Result Value Ref Range   Salicylate Lvl 9.9 2.8 - 30.0 mg/dL  Acetaminophen level     Status: Abnormal   Collection Time: 09/18/17  6:48 AM  Result Value Ref Range   Acetaminophen (Tylenol), Serum <10 (L) 10 - 30 ug/mL   Imaging Studies: [place] Lt Upper Extrem Ltd Soft Tissue Non Vascular  Result Date: 09/18/2017 CLINICAL DATA:  Left arm pain, swelling and redness for 5 days. EXAM: ULTRASOUND left UPPER EXTREMITY LIMITED TECHNIQUE: Ultrasound examination of the upper extremity soft tissues was performed in the area of clinical concern. COMPARISON:  None FINDINGS: Edema with streaky fluid noted in the subcutaneous tissues. 1 small focal fluid collection is demonstrated anterior and lateral to the elbow. This measures approximately 1.1 x 0.7 cm. IMPRESSION: Diffuse subcutaneous edema and streaky fluid with 1  small fluid collection as above. Electronically Signed   By: Rudie MeyerP.  Gallerani M.D.   On: 09/18/2017 08:09    ED COURSE  Nursing notes and initial vitals signs, including pulse oximetry, reviewed.  Vitals:   09/18/17 1145 09/18/17 1314 09/18/17 1430 09/18/17 2155  BP:   131/65 131/72  Pulse:   87 70  Resp:   18 18  Temp:   98.2 F (36.8 C) 98.7 F (37.1 C)  TempSrc:   Oral Oral  SpO2:   99% 100%  Weight:  83.2 kg (183 lb 6.8 oz)    Height: 5\' 11"  (1.803 m)      7:11 AM Wound drainage cultured and vancomycin IV ordered.  Ultrasound ordered to evaluate for underlying abscess. Dr. Denton LankSteinl to follow up on diagnostic studies.   PROCEDURES    ED DIAGNOSES     ICD-10-CM   1. Cellulitis of left arm L03.114   2. Infection B99.9 US LT UPPER EXTREM LTD SOFT TISSUE NON VASCULAR    US LT UPPER EXTREM LTD SOFT TISSUE NON VASCULAR  3. Polysubstance abuse (HCC) F19.10   4. Abscess of left arm L02.414   5. Suicide attempt by substance overdose, initial encounter [facility](HCC) B14.78GNT65.92XA        Paula LibraMolpus, Citlally Captain, MD 09/18/17 2238

## 2017-09-18 NOTE — Consult Note (Signed)
WOC Nurse wound consult note Reason for Consult: S/p I&D in ED of left elbow lesion draining pus  Wound type:infectious Pressure Injury POA: N/A Measurement: 1cm x 0.2cm with depth unable to be determined Wound bed:red Drainage (amount, consistency, odor) serosanguinous from I&D Periwound:erythematous, warm Dressing procedure/placement/frequency: I agree with initial dressing and will provide Nursing with guidance for twice daily wound care using 1/4 inch iodoform gauze packing strip to fill defect, covered with 4x4s and secured with Kerlix roll gauze/paper tape. WOC nursing team will not follow, but will remain available to this patient, the nursing and medical teams.  Please re-consult if needed. Thanks, Ladona MowLaurie Lexi Conaty, MSN, RN, GNP, Hans EdenCWOCN, CWON-AP, FAAN  Pager# [phone]

## 2017-09-18 NOTE — ED Notes (Signed)
Pt. Belongings was placed in bags.Brown wallet, black pocket knife, purple lighter, change, and  [place] cigarettes was placed in small bag inside of belonging bag. Belongings have been put in cabinet labeled 19-22.

## 2017-09-18 NOTE — H&P (Signed)
History and Physical    John FlockRobert Keehan WUJ:[id number] DOB: 03/20/1981 DOA: 09/18/2017  PCP: Patient, No Pcp Per   I have briefly [facility]  Patient coming from: home  Chief Complaint: Suicidal ideation, left upper extremity with swelling, erythema and pain.  HPI: John [name] is a 37 year old male with a past medical history significant for asthma, schizoaffective disorder, depression, polysubstance abuse, tobacco abuse, hepatitis C and gastroesophageal reflux disease; who came to the hospital secondary to left upper extremity pain swelling and erythema.  Patient reports changes in his limp has been present for the last 2 days and worsening.  Patient express no injecting his usual IV drug on the affected area and reported having sudden increase itching/scratching before the development of cellulitic process.  Patient endorses suicidal thoughts, depression/frustration and acute failed attempt on overdose the day prior to admission.  He denies chest pain, shortness of breath, abdominal pain, nausea, vomiting, hematuria, dysuria, hematochezia, melena or any other complaints.  ED Course: Patient status post I&D, cultures taken, IV antibiotics initiated.  [facility] contacted to admit patient for further evaluation and treatment.    Review of Systems:  All other systems reviewed and apart from HPI, are negative.  Past Medical History:  Diagnosis Date  . Asthma   . CVA (cerebral infarction)    drug induced  . Depression   . Drug addiction (HCC)   . Hepatitis C   . Seizures (HCC)    drug induced    Past Surgical History:  Procedure Laterality Date  . ABDOMINAL SURGERY      Social History  reports that he has been smoking cigarettes.  He has been smoking about 2.00 packs per day. he has never used smokeless tobacco. He reports that he drinks alcohol. He reports that he uses drugs. Drugs: Marijuana, Methamphetamines, and Heroin.  Allergies    Allergen Reactions  . Haldol [Haloperidol Lactate] Swelling  . Tylenol [Acetaminophen] Other (See Comments)    unknown  . Risperdal [Risperidone] Anxiety    dyskinesia  dyskinesia     Family History  Problem Relation Age of Onset  . Diabetes Paternal Uncle   . Diabetes Paternal Grandmother   . Depression Mother   . Depression Father   . Alcoholism Other      Prior to Admission medications   Medication Sig Start Date End Date Taking? Authorizing Provider  benztropine (COGENTIN) 1 MG tablet Take 1 tablet (1 mg total) by mouth at bedtime. Patient not taking: Reported on 09/18/2017 03/26/16   Adonis BrookAgustin, Sheila, NP  EPINEPHrine 0.3 mg/0.3 mL IJ SOAJ injection Inject 0.3 mLs (0.3 mg total) into the muscle once. 03/22/16   Long, Arlyss RepressJoshua G, MD  gabapentin (NEURONTIN) 400 MG capsule Take 1 capsule (400 mg total) by mouth 4 (four) times daily - after meals and at bedtime. Patient not taking: Reported on 09/18/2017 03/26/16   Adonis BrookAgustin, Sheila, NP  hydrOXYzine (ATARAX/VISTARIL) 25 MG tablet Take 1 tablet (25 mg total) by mouth every 6 (six) hours as needed for anxiety. Patient not taking: Reported on 09/18/2017 03/26/16   Adonis BrookAgustin, Sheila, NP  nicotine polacrilex (NICORETTE) 2 MG gum Take 1 each (2 mg total) by mouth as needed for smoking cessation. Patient not taking: Reported on 09/18/2017 03/26/16   Adonis BrookAgustin, Sheila, NP  QUEtiapine (SEROQUEL) 200 MG tablet Take 1 tablet (200 mg total) by mouth at bedtime. Patient not taking: Reported on 09/18/2017 03/26/16   Adonis BrookAgustin, Sheila, NP    Physical  Exam: Vitals:   09/18/17 0410 09/18/17 1014 09/18/17 1145  BP: 131/83 129/84   Pulse: (!) 102 89   Resp: 19 16   Temp: 97.9 F (36.6 C) 98.1 F (36.7 C)   TempSrc: Oral Oral   SpO2: 100% 95%   Height:   5\' 11"  (1.803 m)   Constitutional: Afebrile, with flat affect and poor conversational interaction.  Denies chest pain, shortness of breath, nausea, vomiting and abdominal pain.  Patient reported pain on his left  upper extremity and some passive suicidal thoughts currently. Eyes: PERTLA, lids and conjunctivae normal.  No nystagmus, no icterus ENMT: Mucous membranes are moist. Posterior pharynx clear of any exudate or lesions.  Fair dentition.  Neck: supple, no masses, no thyromegaly, no JVD Respiratory: clear to auscultation bilaterally, no wheezing, no crackles. Normal respiratory effort. No accessory muscle use.  Cardiovascular: S1 & S2 heard, regular rate and rhythm, no murmurs / rubs / gallops. No carotid bruits.  Abdomen: No distension, no tenderness, no masses palpated. No hepatosplenomegaly. Bowel sounds normal.  Musculoskeletal: no clubbing / cyanosis. No joint deformity upper and lower extremities. Good ROM, no contractures. Normal muscle tone.  Skin: Positive erythema, warm sensation, pain and serosanguineous oozing on his left upper extremity (patient status post I&D by emergency department physician, with significant purulence discharge at the time of procedure). Neurologic: CN 2-12 grossly intact. Sensation intact, DTR normal. Strength 5/5 in all 4 limbs.  Psychiatric: Alert and oriented x 3.  Flat affect, and decrease eye contact. He reported failed attempt to end his life on 09/17/17 with heroin overdose.     Labs on Admission: I have personally reviewed following labs and imaging studies  CBC: Recent Labs  Lab 09/18/17 0648  WBC 11.0*  NEUTROABS 8.3*  HGB 13.8  HCT 39.0  MCV 91.8  PLT 251   Basic Metabolic Panel: Recent Labs  Lab 09/18/17 0648  NA 135  K 3.5  CL 99*  CO2 24  GLUCOSE 93  BUN 10  CREATININE 0.98  CALCIUM 9.0   Liver Function Tests: Recent Labs  Lab 09/18/17 0648  AST 38  ALT 47  ALKPHOS 75  BILITOT 1.1  PROT 7.8  ALBUMIN 4.1   Urine analysis:    Component Value Date/Time   COLORURINE Yellow 08/07/2014 1236   APPEARANCEUR Hazy 08/07/2014 1236   LABSPEC 1.025 08/07/2014 1236   PHURINE 5.0 08/07/2014 1236   GLUCOSEU Negative 08/07/2014 1236    HGBUR Negative 08/07/2014 1236   BILIRUBINUR Negative 08/07/2014 1236   KETONESUR Negative 08/07/2014 1236   PROTEINUR Negative 08/07/2014 1236   NITRITE Negative 08/07/2014 1236   LEUKOCYTESUR Negative 08/07/2014 1236     Radiological Exams on Admission: [place] Lt Upper Extrem Ltd Soft Tissue Non Vascular  Result Date: 09/18/2017 CLINICAL DATA:  Left arm pain, swelling and redness for 5 days. EXAM: ULTRASOUND left UPPER EXTREMITY LIMITED TECHNIQUE: Ultrasound examination of the upper extremity soft tissues was performed in the area of clinical concern. COMPARISON:  None FINDINGS: Edema with streaky fluid noted in the subcutaneous tissues. 1 small focal fluid collection is demonstrated anterior and lateral to the elbow. This measures approximately 1.1 x 0.7 cm. IMPRESSION: Diffuse subcutaneous edema and streaky fluid with 1 small fluid collection as above. Electronically Signed   By: Rudie Meyer M.D.   On: 09/18/2017 08:09    EKG:  None  Assessment/Plan 1-cellulitis/abscess of left upper extremity: -Patient with purulent cellulitis and abscess on his left upper extremity extending from the  antecubital fossa all the way to his mid arm. -Currently afebrile and nontoxic -Status post incision and drainage in the ED. -Cultures taken and patient started on vancomycin as per cellulitis protocol -Wound care service has been consulted to assist with further care of his wound -PRN analgesics (avoiding narcotics, given hx of opiates abuse); stared on schedule ibuprofen  -continue supportive care and keep arm elevated -will follow clinical response   2-Hepatitis C -will check viral load -will benefit of outpatient follow up with ID  3-Schizoaffective disorder, bipolar type (HCC): uncontrolled and with active suicidal ideation. -sitter at bedside ordered -psychiatry consulted -patient has stopped taking all his meds for couple months now -will follow psych rec's -patient reported attempt to  overdose on 09/17/17  4-Hx of intrinsic asthma -no SOB, no wheezing -good O2 sat on RA -will monitor for now   5-Polysubstance abuse (HCC): including heroin, cocaine, amphetamines and alcohol  -will monitor him on CIWA -he reported not longer using as much alcohol  -will check UDS  6-tobacco abuse -cessation counseling provided -nicotine patch ordered   7-GERD/GI prophylaxis -will continue PPI  Time: 65 minutes   DVT prophylaxis: Heparin Code Status: Full code Family Communication: No family at bedside  Disposition Plan: To be determined.  Patient might ended requiring to go to psychiatry  facility prior to discharge home. Consults called: Wound care and psychiatry service Admission status: Inpatient, MedSurg, length of stay more than 2 midnights.   Vassie Loll MD [facility] Pager [phone]  If 7PM-7AM, please contact night-coverage [email or web address] Password [facility]  09/18/2017, 12:58 PM

## 2017-09-18 NOTE — ED Triage Notes (Signed)
Pt is a drug abuser and apparently overdosed yesterday but didn't come in the hospital [place] Pt last used heroin about 11am yesterday

## 2017-09-18 NOTE — ED Provider Notes (Signed)
Signed out by Dr Read DriversMolpus to get labs back and admit to hospitalist.  Labs returned.  Patient has small open area to skin lateral left elbow, draining pus.  Underlying fluctuance/induration.  I and D of area.   INCISION AND DRAINAGE Performed by: Suzi RootsKevin E Taym Twist Consent: Verbal consent obtained. Risks and benefits: risks, benefits and alternatives were discussed Type: abscess  Body area: left elbow/arm  Anesthesia: local infiltration  Incision was made with a scalpel.  Local anesthetic: lidocaine 2% w epinephrine  Anesthetic total: 5 ml  Complexity: complex Blunt dissection to break up loculations  Drainage: purulent  Drainage amount: moderate  Packing material: 1/4 in iodoform gauze  Patient tolerance: Patient tolerated the procedure well with no immediate complications.  Sterile dressing.  Hospitalists consulted for admission.      Cathren LaineSteinl, Kensey Luepke, MD 09/18/17 [phone]

## 2017-09-18 NOTE — Progress Notes (Signed)
A consult was received from an ED physician for vancomycin per pharmacy dosing.  The patient's profile has been reviewed for ht/wt/allergies/indication/available labs.   A one time order has been placed for vancomycin 2gm IV x 1.   Further antibiotics/pharmacy consults should be ordered by admitting physician if indicated.                       Thank you, Dannielle HuhZeigler, Dustin George 09/18/2017  6:59 AM

## 2017-09-18 NOTE — ED Triage Notes (Signed)
Pt states that he tried to do enough heroin this am to kill himself, he knows how much fentanyl it would take to do it Pt states that he doesn't shoot up in that arm but he was high and was scratching so much that he has made his arm infected

## 2017-09-18 NOTE — Consult Note (Addendum)
[facility] Consult   Reason for Consult:  SI Referring Physician:  Dr. Dyann Kief Patient Identification: John [name] MRN:  [id number] Principal Diagnosis: Substance induced mood disorder [facility]) Diagnosis:   Patient Active Problem List   Diagnosis Date Noted  . Abscess of left upper extremity [L02.414] 09/18/2017  . Hx of intrinsic asthma [Z87.09] 09/18/2017  . Polysubstance abuse ([place]) [F19.10] 09/18/2017  . Depression with suicidal ideation [F32.9, R45.851] 09/18/2017  . Neuroleptic induced acute dystonia [G24.02, T43.505A] 03/22/2016  . Schizoaffective disorder, bipolar type ([place]) [F25.0] 03/20/2016  . PTSD (post-traumatic stress disorder) [F43.10] 03/20/2016  . Cocaine use disorder, moderate, dependence ([place]) [F14.20] 03/20/2016  . Stimulant use disorder [F15.90] 03/20/2016  . Cannabis use disorder, moderate, dependence ([place]) [F12.20] 03/20/2016  . Alcohol use disorder, moderate, dependence ([place]) [F10.20] 03/20/2016  . Opioid use disorder, moderate, dependence ([place]) [F11.20] 03/20/2016  . Hepatitis C [B19.20] 10/13/2013    Total Time spent with patient: 1 hour  Subjective:   John [name] is a 37 y.o. male patient admitted with abscess of left upper extremity s/p I&D .  HPI:   Per chart review, patient was admitted today for treatment of left upper extremity abscess s/p I&D. He received a one time dose of Vancomycin and I&D of area. He reported suicide attempt by overdosing on heroin. He last used heroin at 11 am yesterday. He also reported that he knows how much Fentanyl to take to kill himself. UDS was positive for amphetamines and opiates.   Of note, he was last admitted to [facility] in 03/2016 for SI, worsening depressive symptoms and AH in the setting of drug use and psychosocial stressors. He was discharged on Seroquel 200 mg qhs, Cogentin 1 mg qhs, Gabapentin 400 mg QID and Atarax 25 mg q 6 hours PRN.   On interview, John [name] reports intentionally overdosing on  heroin to kill himself. He reports worsening mood since Christmas since he was unable to purchase his children Christmas gifts due to financial stressors. His children are 66 y/o and 33 y/o. They live with their mother. He does not see them often and reports a poor relationship with them. He additionally endorses poor sleep for the past 7 days in the setting of methamphetamine use. He reports relapse with substance (methamphetamine and heroin) for the past 2.5 weeks. He was previously clean for 14 months. He quit on his own. He denies HI or AVH. He reports impulsive behaviors (abruptly driving to [place] and [place]) when he was 37 y/o and manic symptoms but denies similar symptoms since this time.   Past Psychiatric History: Schizoaffective disorder, bipolar type, cocaine abuse, methamphetamine abuse and heroin abuse.  Risk to Self: Is patient at risk for suicide?: Yes Risk to Others:  None. Denies HI.  Prior Inpatient Therapy:  He was admitted to [facility] in 03/2016 for SI and worsening depressive symptoms.  Prior Outpatient Therapy:  Unknown  Past Medical History:  Past Medical History:  Diagnosis Date  . Asthma   . CVA (cerebral infarction)    drug induced  . Depression   . Drug addiction ([place])   . Hepatitis C   . 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 ABDOMINAL SURGERY     Family History:  Family History  Problem Relation Age of Onset  . Diabetes Paternal Uncle   . Diabetes Paternal Grandmother   . Depression Mother   . Depression Father   . Alcoholism  Other    Family Psychiatric  History: Father-bipolar disorder, maternal uncle-schizophrenia and an extensive family history of substance use.  Social History:  Social History   Substance and Sexual Activity  Alcohol Use Yes   Comment: occ     Social History   Substance and Sexual Activity  Drug Use Yes  . Types: Marijuana, Methamphetamines, Heroin   Comment: Opana, heroin,  and pain medications    Social History   Socioeconomic History  . Marital status: Single    Spouse name: None  . Number of children: None  . Years of education: None  . Highest education level: None  Social Needs  . Financial resource strain: None  . Food insecurity - worry: None  . Food insecurity - inability: None  . Transportation needs - medical: None  . Transportation needs - non-medical: None  Occupational History  . None  Tobacco Use  . Smoking status: Current Every Day Smoker    Packs/day: 2.00    Types: Cigarettes  . Smokeless tobacco: Never Used  Substance and Sexual Activity  . Alcohol use: Yes    Comment: occ  . Drug use: Yes    Types: Marijuana, Methamphetamines, Heroin    Comment: Opana, heroin, and pain medications  . Sexual activity: None  Other Topics Concern  . None  Social History Narrative  . None   Additional Social History: He is currently homeless. He was living with his mother but she told him he could no longer live with her due to substance use. He has been living with friends. He reports regular methamphetamine and heroin use. He reports occasional marijuana use. He denies alcohol use. He is employed at [employer].     Allergies:   Allergies  Allergen Reactions  . Haldol [Haloperidol Lactate] Swelling  . Tylenol [Acetaminophen] Other (See Comments)    unknown  . Risperdal [Risperidone] Anxiety    dyskinesia  dyskinesia     Labs:  Results for orders placed or performed during the hospital encounter of 09/18/17 (from the past 48 hour(s))  Rapid urine drug screen (hospital performed)     Status: Abnormal   Collection Time: 09/18/17  6:48 AM  Result Value Ref Range   Opiates POSITIVE (A) NONE DETECTED   Cocaine NONE DETECTED NONE DETECTED   Benzodiazepines NONE DETECTED NONE DETECTED   Amphetamines POSITIVE (A) NONE DETECTED   Tetrahydrocannabinol NONE DETECTED NONE DETECTED   Barbiturates NONE DETECTED NONE DETECTED    Comment:  (NOTE) DRUG SCREEN FOR MEDICAL PURPOSES ONLY.  IF CONFIRMATION IS NEEDED FOR ANY PURPOSE, NOTIFY LAB WITHIN 5 DAYS. LOWEST DETECTABLE LIMITS FOR URINE DRUG SCREEN Drug Class                     Cutoff (ng/mL) Amphetamine and metabolites    1000 Barbiturate and metabolites    200 Benzodiazepine                 364 Tricyclics and metabolites     300 Opiates and metabolites        300 Cocaine and metabolites        300 THC                            50   Ethanol     Status: None   Collection Time: 09/18/17  6:48 AM  Result Value Ref Range   Alcohol, Ethyl (B) <10 <10 mg/dL  Comment:        LOWEST DETECTABLE LIMIT FOR SERUM ALCOHOL IS 10 mg/dL FOR MEDICAL PURPOSES ONLY   Wound or Superficial Culture     Status: None (Preliminary result)   Collection Time: 09/18/17  6:48 AM  Result Value Ref Range   Specimen Description      ABSCESS LEFT ARM Performed at [facility], [address], [place], [place] [address]    Special Requests NONE    Gram Stain PENDING    Culture PENDING    Report Status PENDING   CBC with Differential/Platelet     Status: Abnormal   Collection Time: 09/18/17  6:48 AM  Result Value Ref Range   WBC 11.0 (H) 4.0 - 10.5 K/uL   RBC 4.25 4.22 - 5.81 MIL/uL   Hemoglobin 13.8 13.0 - 17.0 g/dL   HCT 39.0 39.0 - 52.0 %   MCV 91.8 78.0 - 100.0 fL   MCH 32.5 26.0 - 34.0 pg   MCHC 35.4 30.0 - 36.0 g/dL   RDW 13.5 11.5 - 15.5 %   Platelets 251 150 - 400 K/uL   Neutrophils Relative % 76 %   Neutro Abs 8.3 (H) 1.7 - 7.7 K/uL   Lymphocytes Relative 15 %   Lymphs Abs 1.7 0.7 - 4.0 K/uL   Monocytes Relative 8 %   Monocytes Absolute 0.9 0.1 - 1.0 K/uL   Eosinophils Relative 1 %   Eosinophils Absolute 0.1 0.0 - 0.7 K/uL   Basophils Relative 0 %   Basophils Absolute 0.0 0.0 - 0.1 K/uL  Comprehensive metabolic panel     Status: Abnormal   Collection Time: 09/18/17  6:48 AM  Result Value Ref Range   Sodium 135 135 - 145 mmol/L   Potassium 3.5 3.5 - 5.1  mmol/L   Chloride 99 (L) 101 - 111 mmol/L   CO2 24 22 - 32 mmol/L   Glucose, Bld 93 65 - 99 mg/dL   BUN 10 6 - 20 mg/dL   Creatinine, Ser 0.98 0.61 - 1.24 mg/dL   Calcium 9.0 8.9 - 10.3 mg/dL   Total Protein 7.8 6.5 - 8.1 g/dL   Albumin 4.1 3.5 - 5.0 g/dL   AST 38 15 - 41 U/L   ALT 47 17 - 63 U/L   Alkaline Phosphatase 75 38 - 126 U/L   Total Bilirubin 1.1 0.3 - 1.2 mg/dL   GFR calc non Af Amer >60 >60 mL/min   GFR calc Af Amer >60 >60 mL/min    Comment: (NOTE) The eGFR has been calculated using the CKD EPI equation. This calculation has not been validated in all clinical situations. eGFR's persistently <60 mL/min signify possible Chronic Kidney Disease.    Anion gap 12 5 - 15  Salicylate level     Status: None   Collection Time: 09/18/17  6:48 AM  Result Value Ref Range   Salicylate Lvl 9.9 2.8 - 30.0 mg/dL  Acetaminophen level     Status: Abnormal   Collection Time: 09/18/17  6:48 AM  Result Value Ref Range   Acetaminophen (Tylenol), Serum <10 (L) 10 - 30 ug/mL    Comment:        THERAPEUTIC CONCENTRATIONS VARY SIGNIFICANTLY. A RANGE OF 10-30 ug/mL MAY BE AN EFFECTIVE CONCENTRATION FOR MANY PATIENTS. HOWEVER, SOME ARE BEST TREATED AT CONCENTRATIONS OUTSIDE THIS RANGE. ACETAMINOPHEN CONCENTRATIONS >150 ug/mL AT 4 HOURS AFTER INGESTION AND >50 ug/mL AT 12 HOURS AFTER INGESTION ARE OFTEN ASSOCIATED WITH TOXIC REACTIONS.  Current Facility-Administered Medications  Medication Dose Route Frequency Provider Last Rate Last Dose  . 0.9 %  sodium chloride infusion   Intravenous Continuous Barton Dubois, MD 75 mL/hr at 09/18/17 1121    . acetaminophen (TYLENOL) tablet 650 mg  650 mg Oral Q6H PRN Barton Dubois, MD   650 mg at 09/18/17 1122   Or  . acetaminophen (TYLENOL) suppository 650 mg  650 mg Rectal Q6H PRN Barton Dubois, MD      . folic acid (FOLVITE) tablet 1 mg  1 mg Oral Daily Barton Dubois, MD   1 mg at 09/18/17 1122  . heparin injection 5,000 Units  5,000  Units Subcutaneous Q8H Barton Dubois, MD      . ibuprofen (ADVIL,MOTRIN) tablet 600 mg  600 mg Oral TID Barton Dubois, MD      . LORazepam (ATIVAN) tablet 1 mg  1 mg Oral Q6H PRN Barton Dubois, MD   1 mg at 09/18/17 1122   Or  . LORazepam (ATIVAN) injection 1 mg  1 mg Intravenous Q6H PRN Barton Dubois, MD      . multivitamin with minerals tablet 1 tablet  1 tablet Oral Daily Barton Dubois, MD   1 tablet at 09/18/17 1121  . nicotine (NICODERM CQ - dosed in mg/24 hours) patch 14 mg  14 mg Transdermal Daily Barton Dubois, MD      . ondansetron [facility]) tablet 4 mg  4 mg Oral Q6H PRN Barton Dubois, MD       Or  . ondansetron [facility]) injection 4 mg  4 mg Intravenous Q6H PRN Barton Dubois, MD   4 mg at 09/18/17 1203  . pantoprazole (PROTONIX) EC tablet 40 mg  40 mg Oral Daily Barton Dubois, MD   40 mg at 09/18/17 1122  . thiamine (VITAMIN B-1) tablet 100 mg  100 mg Oral Daily Barton Dubois, MD   100 mg at 09/18/17 1122   Or  . thiamine (B-1) injection 100 mg  100 mg Intravenous Daily Barton Dubois, MD      . vancomycin (VANCOCIN) 1,250 mg in sodium chloride 0.9 % 250 mL IVPB  1,250 mg Intravenous Q12H Berton Mount, RPH        Musculoskeletal: Strength & Muscle Tone: within normal limits Gait & Station: UTA due to patient lying in bed. Patient leans: N/A  Psychiatric Specialty Exam: Physical Exam  Nursing note and vitals reviewed. Constitutional: He is oriented to person, place, and time. He appears well-developed and well-nourished.  HENT:  Head: Normocephalic and atraumatic.  Neck: Normal range of motion.  Respiratory: Effort normal.  Musculoskeletal: Normal range of motion.  Neurological: He is alert and oriented to person, place, and time.  Skin: There is erythema (Left forearm).  Psychiatric: He has a normal mood and affect. His speech is normal and behavior is normal. Cognition and memory are normal. He expresses impulsivity. He expresses suicidal ideation. He  expresses no homicidal ideation.    Review of Systems  Constitutional: Positive for chills and fever.  Cardiovascular: Positive for chest pain.  Gastrointestinal: Positive for abdominal pain, diarrhea, nausea and vomiting. Negative for constipation.  Psychiatric/Behavioral: Positive for depression, substance abuse and suicidal ideas. Negative for hallucinations. The patient has insomnia. The patient is not nervous/anxious.     Blood pressure 129/84, pulse 89, temperature 98.1 F (36.7 C), temperature source Oral, resp. rate 16, height _0  (1.803 m), SpO2 95 %.Body mass index is 29.57 kg/m.  General Appearance: Fairly Groomed, young, Caucasian male with  an unshaved face and bruising over left eyelid who is wearing a hospital gown and lying in bed. NAD.   Eye Contact:  Good  Speech:  Clear and Coherent and Normal Rate  Volume:  Normal  Mood:  Depressed  Affect:  Full Range  Thought Process:  Goal Directed and Linear  Orientation:  Full (Time, Place, and Person)  Thought Content:  Logical  Suicidal Thoughts:  Yes.  with intent/plan  Homicidal Thoughts:  No  Memory:  Immediate;   Good Recent;   Good Remote;   Good  Judgement:  Poor  Insight:  Fair  Psychomotor Activity:  Normal  Concentration:  Concentration: Good and Attention Span: Good  Recall:  Good  Fund of Knowledge:  Good  Language:  Good  Akathisia:  No  Handed:  Right  AIMS (if indicated):   N/A  Assets:  Desire for Improvement Financial Resources/Insurance  ADL's:  Intact  Cognition:  WNL  Sleep:   Poor   Assessment: Carlyn Mullenbach is a 37 y.o. male who was admitted for abscess s/p I&D. He endorsed SI with intentional drug overdose on admission. He reports multiple psychosocial stressors including financial stressors that would not allow him to buy his children Christmas gifts and homelessness. He warrants inpatient psychiatric hospitalization for stabilization and treatment.   Treatment Plan Summary: -Patient  warrants inpatient psychiatric hospitalization given high risk of harm to self. -Continue bedside sitter.  -Continue CIWA protocol to monitor for withdrawal. Would start Clonidine protocol for opiate withdrawal symptoms.  -Will defer medication management for inpatient psychiatric hospitalization. Patient's primary problem appears to be substance abuse and psychosocial stressors at this time.  -Patient request resources for substance abuse treatment and can be provided during inpatient psychiatric hospitalization as well as housing resources.  -Please pursue involuntary commitment if patient refuses voluntary psychiatric hospitalization or attempts to leave the hospital.  -Will sign off on patient at this time. Please consult psychiatry again as needed.     Disposition: Recommend psychiatric Inpatient admission when medically cleared.  Faythe Dingwall, DO 09/18/2017 12:26 PM

## 2017-09-19 LAB — CBC
HEMATOCRIT: 35.1 % — AB (ref 39.0–52.0)
HEMOGLOBIN: 11.7 g/dL — AB (ref 13.0–17.0)
MCH: 31 pg (ref 26.0–34.0)
MCHC: 33.3 g/dL (ref 30.0–36.0)
MCV: 93.1 fL (ref 78.0–100.0)
Platelets: 210 10*3/uL (ref 150–400)
RBC: 3.77 MIL/uL — AB (ref 4.22–5.81)
RDW: 13.5 % (ref 11.5–15.5)
WBC: 6.6 10*3/uL (ref 4.0–10.5)

## 2017-09-19 LAB — COMPREHENSIVE METABOLIC PANEL
ALBUMIN: 3 g/dL — AB (ref 3.5–5.0)
ALT: 33 U/L (ref 17–63)
ANION GAP: 5 (ref 5–15)
AST: 28 U/L (ref 15–41)
Alkaline Phosphatase: 61 U/L (ref 38–126)
BUN: 7 mg/dL (ref 6–20)
CALCIUM: 8.1 mg/dL — AB (ref 8.9–10.3)
CO2: 26 mmol/L (ref 22–32)
Chloride: 107 mmol/L (ref 101–111)
Creatinine, Ser: 0.72 mg/dL (ref 0.61–1.24)
GFR calc non Af Amer: >60 mL/min (ref >60)
GLUCOSE: 162 mg/dL — AB (ref 65–99)
POTASSIUM: 3.4 mmol/L — AB (ref 3.5–5.1)
SODIUM: 138 mmol/L (ref 135–145)
TOTAL PROTEIN: 6 g/dL — AB (ref 6.5–8.1)
Total Bilirubin: 0.6 mg/dL (ref 0.3–1.2)

## 2017-09-19 LAB — HIV ANTIBODY (ROUTINE TESTING W REFLEX): HIV SCREEN 4TH GENERATION: NONREACTIVE

## 2017-09-19 LAB — HCV RNA QUANT
HCV QUANT LOG: 3.801 {Log_IU}/mL (ref >1.70)
HCV QUANT: 6330 [IU]/mL (ref >50)

## 2017-09-19 MED ORDER — VANCOMYCIN HCL 10 G IV SOLR
1750.0000 mg | Freq: Two times a day (BID) | INTRAVENOUS | Status: DC
  Administered 2017-09-20 – 2017-09-23 (×8): 1750 mg via INTRAVENOUS
  Filled 2017-09-19 (×11): qty 1750

## 2017-09-19 MED ORDER — POTASSIUM CHLORIDE CRYS ER 20 MEQ PO TBCR
40.0000 meq | EXTENDED_RELEASE_TABLET | Freq: Once | ORAL | Status: AC
  Administered 2017-09-19: 40 meq via ORAL
  Filled 2017-09-19: qty 2

## 2017-09-19 MED ORDER — LORAZEPAM 2 MG/ML IJ SOLN
1.0000 mg | Freq: Four times a day (QID) | INTRAMUSCULAR | Status: DC | PRN
  Administered 2017-09-20 – 2017-09-21 (×3): 1 mg via INTRAVENOUS
  Filled 2017-09-19 (×3): qty 1

## 2017-09-19 MED ORDER — TRAMADOL HCL 50 MG PO TABS
50.0000 mg | ORAL_TABLET | Freq: Four times a day (QID) | ORAL | Status: DC | PRN
  Administered 2017-09-19 – 2017-09-22 (×8): 50 mg via ORAL
  Filled 2017-09-19 (×8): qty 1

## 2017-09-19 MED ORDER — BENZOCAINE 10 % MT GEL
Freq: Three times a day (TID) | OROMUCOSAL | Status: DC | PRN
  Filled 2017-09-19: qty 9.4

## 2017-09-19 MED ORDER — NICOTINE 21 MG/24HR TD PT24
21.0000 mg | MEDICATED_PATCH | Freq: Every day | TRANSDERMAL | Status: DC
  Administered 2017-09-20 – 2017-09-25 (×6): 21 mg via TRANSDERMAL
  Filled 2017-09-19 (×6): qty 1

## 2017-09-19 MED ORDER — LORAZEPAM 1 MG PO TABS
1.0000 mg | ORAL_TABLET | ORAL | Status: DC | PRN
  Administered 2017-09-19 – 2017-09-21 (×6): 1 mg via ORAL
  Filled 2017-09-19 (×6): qty 1

## 2017-09-19 MED ORDER — PROMETHAZINE HCL 6.25 MG/5ML PO SYRP
12.5000 mg | ORAL_SOLUTION | Freq: Four times a day (QID) | ORAL | Status: DC | PRN
  Administered 2017-09-19 – 2017-09-21 (×6): 12.5 mg via ORAL
  Administered 2017-09-22: 1.25 mg via ORAL
  Administered 2017-09-22 – 2017-09-24 (×4): 12.5 mg via ORAL
  Filled 2017-09-19 (×18): qty 10

## 2017-09-19 MED ORDER — VANCOMYCIN HCL 500 MG IV SOLR
500.0000 mg | Freq: Once | INTRAVENOUS | Status: AC
  Administered 2017-09-19: 500 mg via INTRAVENOUS
  Filled 2017-09-19: qty 500

## 2017-09-19 NOTE — Clinical Social Work Note (Signed)
Clinical Social Work Assessment  Patient Details  Name: John [name] MRN: [id number] Date of Birth: [birth year]  Date of referral:  09/19/17               Reason for consult:  Facility Placement(Inpatient psych)                Permission sought to share information with:  Other, [employer](Employer) Permission granted to share information::  Yes, Verbal Permission Granted, Yes, Release of Information Signed(Release signed for employer)  Name::     John [name]  Agency::  [employer], [place]  Relationship::  Mother  Contact Information:     Housing/Transportation Living arrangements for the past 2 months:  Homeless, [place] of Information:  Patient Patient Interpreter Needed:  None Criminal Activity/Legal Involvement Pertinent to Current Situation/Hospitalization:  No - Comment as needed Significant Relationships:  Friend, Parents Lives with:  Friends, Parents Do you feel safe going back to the place where you live?  No Need for family participation in patient care:  Yes (Comment)  Care giving concerns:  Patient reports that he is recently homeless due to his mother kicking him out. Patient reports that he has been living with friends who get high and he is concerned about his sobriety there.    Social Worker assessment / plan:  LCSW consulted for inpatient psych placement.  Patient admitted for intentional suicide.   LCSW met at bedside with patient. No family present. Patient has sitter.   Patient reports that he has been sober for 14 months. Patient reports that he got hurt at work and the MD put him on pain meds that caused him to use again.   Patient reports that he is currently homeless. He reports previously living with his mother who put him out due to his drug use.   Patient reports that he has been staying with friends who use and he does not want to return there.   Patient reports that he has been working full time for the past 6 months. Patient  expressed concerns about notifying his employer about his hospitalization. Patient signed a release to notify his employer of his stay.    PLAN: Patient will go to inpatient psych when medically ready. Patient is currently voluntary.    Employment status:  [employer] information:  Self Pay (Medicaid Pending) PT Recommendations:  Not assessed at this time Information / Referral to community resources:     Patient/Family's Response to care:  Patient asked questions about [facility]. Patient is interested in getting help and meds for his mental health diagnosis. LCSW listened and answered questions.   Patient/Family's Understanding of and Emotional Response to Diagnosis, Current Treatment, and Prognosis:  Patient is understanding of current diagnosis and agreeable to treatment plan. Patient appears to be hopeful of treatment. Patient request resources at dc. LCSW explained that [facility] can assist him with resources at dc from their facility.   Emotional Assessment Appearance:  Appears stated age Attitude/Demeanor/Rapport:    Affect (typically observed):  Calm, Overwhelmed Orientation:  Oriented to Self, Oriented to Place, Oriented to  Time, Oriented to Situation Alcohol / Substance use:  Not Applicable Psych involvement (Current and /or in the community):  No (Comment)  Discharge Needs  Concerns to be addressed:  Financial / Insurance Concerns, Homelessness Readmission within the last 30 days:  No Current discharge risk:  None Barriers to Discharge:  Citizenship Issues   Servando Snare, LCSW 09/19/2017, 1:14 PM

## 2017-09-19 NOTE — Progress Notes (Signed)
PROGRESS NOTE    John [name]  ZOX:[id number] DOB: 1981-06-08 DOA: 09/18/2017 PCP: Patient, No Pcp Per    Brief Narrative: John [name] is a 37 year old male with a past medical history significant for asthma, schizoaffective disorder, depression, polysubstance abuse, tobacco abuse, hepatitis C and gastroesophageal reflux disease; who came to the hospital secondary to left upper extremity pain swelling and erythema.  Patient reports changes in his limp has been present for the last 2 days and worsening.  Patient express no injecting his usual IV drug on the affected area and reported having sudden increase itching/scratching before the development of cellulitic process.  Patient endorses suicidal thoughts, depression/frustration and acute failed attempt on overdose the day prior to admission.  Patient status post I&D, cultures taken, IV antibiotics initiated.  TRH contacted to admit patient for further evaluation and treatment.        Assessment & Plan:   Principal Problem:   Substance induced mood disorder (HCC) Active Problems:   Hepatitis C   Schizoaffective disorder, bipolar type (HCC)   Abscess of left upper extremity   Hx of intrinsic asthma   Polysubstance abuse (HCC)   Depression with suicidal ideation   Left upper extremity cellulitis: abscess of the left arm: Patient status post I&D, cultures taken, IV antibiotics initiated. Pending cultures.  Wound care seen the patient and recommendations given.     H/o hep C; Outpatient follow up with ID.    Schizoaffective disorder:  Uncontrolled.  Psychiatry consulted and recommendations given.    Asthma:  Well controlled.n o wheezing heard.   Tobacco abuse:  On nicotine patch.    Polysubstance abuse:  Used heroin, cocaine and amphetamines and alcohol.  Monitor on CIWA  UDS reviewed.      DVT prophylaxis: heparin Code Status: full code.  Family Communication: none at bedside.  [facility]   Consultants:   psychaitry  Procedures: Patient status post I&D, cultures taken, IV antibiotics initiated  Antimicrobials:  Vancomycin.   Subjective: Pain not well controlled.   Objective: Vitals:   09/18/17 2155 09/19/17 0022 09/19/17 0537 09/19/17 1417  BP: 131/72 122/71 122/72 120/79  Pulse: 70 77 71 89  Resp: 18 16 16 18   Temp: 98.7 F (37.1 C)  97.7 F (36.5 C) 98.4 F (36.9 C)  TempSrc: Oral  Oral Axillary  SpO2: 100% 97% 96% 99%  Weight:      Height:        Intake/Output Summary (Last 24 hours) at 09/19/2017 1816 Last data filed at 09/19/2017 1500 Gross per 24 hour  Intake 2756.25 ml  Output -  Net 2756.25 ml   Filed Weights   09/18/17 1314  Weight: 83.2 kg (183 lb 6.8 oz)    Examination:  General exam: Appears calm and comfortable  Respiratory system: Clear to auscultation. Respiratory effort normal. Cardiovascular system: S1 & S2 heard, RRR. No JVD, murmurs, rubs, gallops or clicks. No pedal edema. Gastrointestinal system: Abdomen is nondistended, soft and nontender. No organomegaly or masses felt. Normal bowel sounds heard. Central nervous system: Alert and oriented. No focal neurological deficits. Extremities: left arm swollen , tender and erythematous.  Skin: No rashes, lesions or ulcers Psychiatry: Judgement and insight appear normal. Mood & affect appropriate.     Data Reviewed: I have personally reviewed following labs and imaging studies  CBC: Recent Labs  Lab 09/18/17 0648 09/19/17 0531  WBC 11.0* 6.6  NEUTROABS 8.3*  --   HGB 13.8 11.7*  HCT 39.0  35.1*  MCV 91.8 93.1  PLT 251 210   Basic Metabolic Panel: Recent Labs  Lab 09/18/17 0648 09/19/17 0531  NA 135 138  K 3.5 3.4*  CL 99* 107  CO2 24 26  GLUCOSE 93 162*  BUN 10 7  CREATININE 0.98 0.72  CALCIUM 9.0 8.1*   GFR: Estimated Creatinine Clearance: 138.6 mL/min (by C-G formula based on SCr of 0.72 mg/dL). Liver Function Tests: Recent Labs  Lab  09/18/17 0648 09/19/17 0531  AST 38 28  ALT 47 33  ALKPHOS 75 61  BILITOT 1.1 0.6  PROT 7.8 6.0*  ALBUMIN 4.1 3.0*   No results for input(s): LIPASE, AMYLASE in the last 168 hours. No results for input(s): AMMONIA in the last 168 hours. Coagulation Profile: No results for input(s): INR, PROTIME in the last 168 hours. Cardiac Enzymes: No results for input(s): CKTOTAL, CKMB, CKMBINDEX, TROPONINI in the last 168 hours. BNP (last 3 results) No results for input(s): PROBNP in the last 8760 hours. HbA1C: No results for input(s): HGBA1C in the last 72 hours. CBG: No results for input(s): GLUCAP in the last 168 hours. Lipid Profile: No results for input(s): CHOL, HDL, LDLCALC, TRIG, CHOLHDL, LDLDIRECT in the last 72 hours. Thyroid Function Tests: No results for input(s): TSH, T4TOTAL, FREET4, T3FREE, THYROIDAB in the last 72 hours. Anemia Panel: No results for input(s): VITAMINB12, FOLATE, FERRITIN, TIBC, IRON, RETICCTPCT in the last 72 hours. Sepsis Labs: No results for input(s): PROCALCITON, LATICACIDVEN in the last 168 hours.  Recent Results (from the past 240 hour(s))  Wound or Superficial Culture     Status: None (Preliminary result)   Collection Time: 09/18/17  6:48 AM  Result Value Ref Range Status   Specimen Description ABSCESS LEFT ARM  Final   Special Requests NONE  Final   Gram Stain   Final    RARE WBC PRESENT, PREDOMINANTLY PMN FEW GRAM POSITIVE COCCI IN PAIRS IN CLUSTERS    Culture   Final    MODERATE STAPHYLOCOCCUS AUREUS SUSCEPTIBILITIES TO FOLLOW Performed at [facility], [address] [place], [place] [address]    Report Status PENDING  Incomplete  Culture, blood (single) w Reflex to ID Panel     Status: None (Preliminary result)   Collection Time: 09/18/17  6:49 AM  Result Value Ref Range Status   Specimen Description BLOOD RIGHT HAND  Final   Special Requests IN PEDIATRIC BOTTLE Blood Culture adequate volume  Final   Culture   Final    NO  GROWTH < 24 HOURS Performed at [facility], [address], [place], [place] [address]    Report Status PENDING  Incomplete         Radiology Studies: [place] Lt Upper Extrem Ltd Soft Tissue Non Vascular  Result Date: 09/18/2017 CLINICAL DATA:  Left arm pain, swelling and redness for 5 days. EXAM: ULTRASOUND left UPPER EXTREMITY LIMITED TECHNIQUE: Ultrasound examination of the upper extremity soft tissues was performed in the area of clinical concern. COMPARISON:  None FINDINGS: Edema with streaky fluid noted in the subcutaneous tissues. 1 small focal fluid collection is demonstrated anterior and lateral to the elbow. This measures approximately 1.1 x 0.7 cm. IMPRESSION: Diffuse subcutaneous edema and streaky fluid with 1 small fluid collection as above. Electronically Signed   By: Rudie Meyer M.D.   On: 09/18/2017 08:09        Scheduled Meds: . folic acid  1 mg Oral Daily  . heparin  5,000 Units  Subcutaneous Q8H  . multivitamin with minerals  1 tablet Oral Daily  . [START ON 09/20/2017] nicotine  21 mg Transdermal Daily  . pantoprazole  40 mg Oral Daily  . thiamine  100 mg Oral Daily   Or  . thiamine  100 mg Intravenous Daily   Continuous Infusions: . sodium chloride 75 mL/hr at 09/18/17 1121  . vancomycin       LOS: 1 day    Time spent: 35 minutes.     Kathlen Mody, MD [facility] Pager [phone]   If 7PM-7AM, please contact night-coverage [email or web address] Password [facility] 09/19/2017, 6:16 PM

## 2017-09-19 NOTE — Progress Notes (Signed)
Pharmacy Antibiotic Note  John [name] is a 37 y.o. male admitted on 09/18/2017 with purulent cellulitis and abscess on LUE from antecubital fossa down to mid arm and known IVDU.  Pharmacy has been consulted for Vancomycin dosing.    Plan: Increase to Vancomycin 1750 mg IV q12h Measure Vanc trough/peak at steady state. Follow up renal fxn, culture results, and clinical course.   Height: 5\' 11"  (180.3 cm) Weight: 183 lb 6.8 oz (83.2 kg) IBW/kg (Calculated) : 75.3  Temp (24hrs), Avg:98.2 F (36.8 C), Min:97.7 F (36.5 C), Max:98.7 F (37.1 C)  Recent Labs  Lab 09/18/17 0648 09/19/17 0531  WBC 11.0* 6.6  CREATININE 0.98 0.72    Estimated Creatinine Clearance: 138.6 mL/min (by C-G formula based on SCr of 0.72 mg/dL).    Allergies  Allergen Reactions  . Haldol [Haloperidol Lactate] Swelling  . Tylenol [Acetaminophen] Other (See Comments)    unknown  . Risperdal [Risperidone] Anxiety    dyskinesia  dyskinesia     Antimicrobials this admission: 1/2 >> vanco >>  Dose adjustments this admission:   Microbiology results: 1/2BCx: ngtd 1/2 wound cx:few GPC pairs/clusters  Thank you for allowing pharmacy to be a part of this patient's care.  Lynann Beaverhristine Murry Khiev PharmD, BCPS Pager [phone] 09/19/2017 7:58 AM

## 2017-09-19 NOTE — Progress Notes (Signed)
LCSW following for inpatient psych placement.  Patient will go to inpatient psych when stable. Patient not medically ready today.   Patient request note for his employer. LCSW notified RN.   Patient signed release of information. Need fax nu,ber for employer. LCSW left voicemail for employer requesting fax number.   John [name], LSCW [facility] [employer] [phone]

## 2017-09-20 LAB — BASIC METABOLIC PANEL
ANION GAP: 5 (ref 5–15)
BUN: 8 mg/dL (ref 6–20)
CALCIUM: 8.4 mg/dL — AB (ref 8.9–10.3)
CO2: 26 mmol/L (ref 22–32)
Chloride: 108 mmol/L (ref 101–111)
Creatinine, Ser: 0.75 mg/dL (ref 0.61–1.24)
GFR calc non Af Amer: >60 mL/min (ref >60)
GLUCOSE: 106 mg/dL — AB (ref 65–99)
POTASSIUM: 4.1 mmol/L (ref 3.5–5.1)
Sodium: 139 mmol/L (ref 135–145)

## 2017-09-20 LAB — AEROBIC CULTURE W GRAM STAIN (SUPERFICIAL SPECIMEN)

## 2017-09-20 LAB — AEROBIC CULTURE  (SUPERFICIAL SPECIMEN)

## 2017-09-20 NOTE — Progress Notes (Signed)
PROGRESS NOTE    Artis FlockRobert Ketcher  NGE:[id number] DOB: 06/19/1981 DOA: 09/18/2017 PCP: Patient, No Pcp Per    Brief Narrative: Artis FlockRobert Meulemans is a 37 year old male with a past medical history significant for asthma, schizoaffective disorder, depression, polysubstance abuse, tobacco abuse, hepatitis C and gastroesophageal reflux disease; who came to the hospital secondary to left upper extremity pain swelling and erythema.  Patient reports changes in his limp has been present for the last 2 days and worsening.  Patient express no injecting his usual IV drug on the affected area and reported having sudden increase itching/scratching before the development of cellulitic process.  Patient endorses suicidal thoughts, depression/frustration and acute failed attempt on overdose the day prior to admission.  Patient status post I&D, cultures taken, IV antibiotics initiated.  [facility] contacted to admit patient for further evaluation and treatment.        Assessment & Plan:   Principal Problem:   Substance induced mood disorder (HCC) Active Problems:   Hepatitis C   Schizoaffective disorder, bipolar type (HCC)   Abscess of left upper extremity   Hx of intrinsic asthma   Polysubstance abuse (HCC)   Depression with suicidal ideation   Left upper extremity cellulitis: abscess of the left arm: Patient status post I&D, cultures taken, IV antibiotics initiated. Abscess cultures growing MRSA, sensitive to vancomycin.  Wound care seen the patient and recommendations given.     H/o hep C; Outpatient follow up with ID.    Schizoaffective disorder:  Uncontrolled.  Psychiatry consulted and recommendations given. No new issues.    Asthma:  Well controlled.no wheezing heard.   Tobacco abuse:  On nicotine patch.    Polysubstance abuse:  Used heroin, cocaine and amphetamines and alcohol.  Monitor on CIWA  UDS reviewed.      DVT prophylaxis: heparin Code Status: full code.  Family Communication:  none at bedside.  [facility]   Consultants:   psychaitry  Procedures: Patient status post I&D, cultures taken, IV antibiotics initiated  Antimicrobials:  Vancomycin.   Subjective: Sleepy, no new complaints.   Objective: Vitals:   09/19/17 2047 09/20/17 0037 09/20/17 0439 09/20/17 1445  BP: 126/76 133/76 129/81 133/89  Pulse: 87 81 78 82  Resp: 20 20 16 16   Temp: 98.2 F (36.8 C) 98.6 F (37 C) (!) 97.5 F (36.4 C) 97.7 F (36.5 C)  TempSrc: Oral Oral Oral Oral  SpO2: 95% 98% 94% 95%  Weight:      Height:        Intake/Output Summary (Last 24 hours) at 09/20/2017 1748 Last data filed at 09/20/2017 1446 Gross per 24 hour  Intake 1420 ml  Output -  Net 1420 ml   Filed Weights   09/18/17 1314  Weight: 83.2 kg (183 lb 6.8 oz)    Examination:  General exam: Appears calm and comfortable, no tin any kind of distress.  Respiratory system: Clear to auscultation. Respiratory effort normal. No wheezing or rhonchi.  Cardiovascular system: S1 & S2 heard, RRR. No JVD, murmurs, . No pedal edema. Gastrointestinal system: Abdomen is nondistended, soft and nontender. No organomegaly or masses felt. Normal bowel sounds heard. Central nervous system: Alert and oriented. No focal neurological deficits. Extremities: left arm swollen , tender and erythematous bandaged.  Skin: No rashes, lesions or ulcers Psychiatry: Mood & affect appropriate.     Data Reviewed: I have personally reviewed following labs and imaging studies  CBC: Recent Labs  Lab 09/18/17 0648 09/19/17 0531  WBC  11.0* 6.6  NEUTROABS 8.3*  --   HGB 13.8 11.7*  HCT 39.0 35.1*  MCV 91.8 93.1  PLT 251 210   Basic Metabolic Panel: Recent Labs  Lab 09/18/17 0648 09/19/17 0531 09/20/17 0558  NA 135 138 139  K 3.5 3.4* 4.1  CL 99* 107 108  CO2 24 26 26   GLUCOSE 93 162* 106*  BUN 10 7 8   CREATININE 0.98 0.72 0.75  CALCIUM 9.0 8.1* 8.4*   GFR: Estimated Creatinine  Clearance: 138.6 mL/min (by C-G formula based on SCr of 0.75 mg/dL). Liver Function Tests: Recent Labs  Lab 09/18/17 0648 09/19/17 0531  AST 38 28  ALT 47 33  ALKPHOS 75 61  BILITOT 1.1 0.6  PROT 7.8 6.0*  ALBUMIN 4.1 3.0*   No results for input(s): LIPASE, AMYLASE in the last 168 hours. No results for input(s): AMMONIA in the last 168 hours. Coagulation Profile: No results for input(s): INR, PROTIME in the last 168 hours. Cardiac Enzymes: No results for input(s): CKTOTAL, CKMB, CKMBINDEX, TROPONINI in the last 168 hours. BNP (last 3 results) No results for input(s): PROBNP in the last 8760 hours. HbA1C: No results for input(s): HGBA1C in the last 72 hours. CBG: No results for input(s): GLUCAP in the last 168 hours. Lipid Profile: No results for input(s): CHOL, HDL, LDLCALC, TRIG, CHOLHDL, LDLDIRECT in the last 72 hours. Thyroid Function Tests: No results for input(s): TSH, T4TOTAL, FREET4, T3FREE, THYROIDAB in the last 72 hours. Anemia Panel: No results for input(s): VITAMINB12, FOLATE, FERRITIN, TIBC, IRON, RETICCTPCT in the last 72 hours. Sepsis Labs: No results for input(s): PROCALCITON, LATICACIDVEN in the last 168 hours.  Recent Results (from the past 240 hour(s))  Wound or Superficial Culture     Status: None   Collection Time: 09/18/17  6:48 AM  Result Value Ref Range Status   Specimen Description ABSCESS LEFT ARM  Final   Special Requests NONE  Final   Gram Stain   Final    RARE WBC PRESENT, PREDOMINANTLY PMN FEW GRAM POSITIVE COCCI IN PAIRS IN CLUSTERS    Culture   Final    ABUNDANT METHICILLIN RESISTANT STAPHYLOCOCCUS AUREUS MODERATE GROUP B STREP(S.AGALACTIAE)ISOLATED TESTING AGAINST S. AGALACTIAE NOT ROUTINELY PERFORMED DUE TO PREDICTABILITY OF AMP/PEN/VAN SUSCEPTIBILITY. Performed at [facility], [address] [place], [place] [address]    Report Status 09/20/2017 FINAL  Final   Organism ID, Bacteria METHICILLIN RESISTANT STAPHYLOCOCCUS AUREUS   Final      Susceptibility   Methicillin resistant staphylococcus aureus - MIC*    CIPROFLOXACIN <=0.5 SENSITIVE Sensitive     ERYTHROMYCIN >=8 RESISTANT Resistant     GENTAMICIN <=0.5 SENSITIVE Sensitive     OXACILLIN >=4 RESISTANT Resistant     TETRACYCLINE <=1 SENSITIVE Sensitive     VANCOMYCIN 1 SENSITIVE Sensitive     TRIMETH/SULFA <=10 SENSITIVE Sensitive     CLINDAMYCIN >=8 RESISTANT Resistant     RIFAMPIN <=0.5 SENSITIVE Sensitive     Inducible Clindamycin NEGATIVE Sensitive     * ABUNDANT METHICILLIN RESISTANT STAPHYLOCOCCUS AUREUS  Culture, blood (single) w Reflex to ID Panel     Status: None (Preliminary result)   Collection Time: 09/18/17  6:49 AM  Result Value Ref Range Status   Specimen Description BLOOD RIGHT HAND  Final   Special Requests IN PEDIATRIC BOTTLE Blood Culture adequate volume  Final   Culture   Final    NO GROWTH 2 DAYS Performed at [facility], [address],  [place], [place] [address]    Report Status PENDING  Incomplete         Radiology Studies: No results found.      Scheduled Meds: . folic acid  1 mg Oral Daily  . heparin  5,000 Units Subcutaneous Q8H  . multivitamin with minerals  1 tablet Oral Daily  . nicotine  21 mg Transdermal Daily  . pantoprazole  40 mg Oral Daily  . thiamine  100 mg Oral Daily   Or  . thiamine  100 mg Intravenous Daily   Continuous Infusions: . sodium chloride 75 mL/hr at 09/18/17 1121  . vancomycin Stopped (09/20/17 1356)     LOS: 2 days    Time spent: 35 minutes.     Kathlen Mody, MD [facility] Pager [phone]   If 7PM-7AM, please contact night-coverage [email or web address] Password [facility] 09/20/2017, 5:48 PM

## 2017-09-21 LAB — BASIC METABOLIC PANEL
Anion gap: 6 (ref 5–15)
BUN: 9 mg/dL (ref 6–20)
CHLORIDE: 104 mmol/L (ref 101–111)
CO2: 26 mmol/L (ref 22–32)
CREATININE: 0.83 mg/dL (ref 0.61–1.24)
Calcium: 8.5 mg/dL — ABNORMAL LOW (ref 8.9–10.3)
GFR calc non Af Amer: >60 mL/min (ref >60)
Glucose, Bld: 103 mg/dL — ABNORMAL HIGH (ref 65–99)
Potassium: 4.2 mmol/L (ref 3.5–5.1)
SODIUM: 136 mmol/L (ref 135–145)

## 2017-09-21 LAB — CBC
HCT: 38.4 % — ABNORMAL LOW (ref 39.0–52.0)
HEMOGLOBIN: 13 g/dL (ref 13.0–17.0)
MCH: 31.2 pg (ref 26.0–34.0)
MCHC: 33.9 g/dL (ref 30.0–36.0)
MCV: 92.1 fL (ref 78.0–100.0)
Platelets: 269 10*3/uL (ref 150–400)
RBC: 4.17 MIL/uL — ABNORMAL LOW (ref 4.22–5.81)
RDW: 13 % (ref 11.5–15.5)
WBC: 7.4 10*3/uL (ref 4.0–10.5)

## 2017-09-21 MED ORDER — LORAZEPAM 1 MG PO TABS
2.0000 mg | ORAL_TABLET | ORAL | Status: DC | PRN
  Administered 2017-09-25: 2 mg via ORAL
  Filled 2017-09-21: qty 2

## 2017-09-21 MED ORDER — LORAZEPAM 2 MG/ML IJ SOLN
1.0000 mg | Freq: Four times a day (QID) | INTRAMUSCULAR | Status: DC | PRN
  Administered 2017-09-21 – 2017-09-25 (×12): 1 mg via INTRAVENOUS
  Filled 2017-09-21 (×13): qty 1

## 2017-09-21 MED ORDER — MORPHINE SULFATE (PF) 4 MG/ML IV SOLN
1.0000 mg | INTRAVENOUS | Status: DC | PRN
  Administered 2017-09-21: 2 mg via INTRAVENOUS
  Administered 2017-09-21: 1 mg via INTRAVENOUS
  Administered 2017-09-21 – 2017-09-25 (×19): 2 mg via INTRAVENOUS
  Filled 2017-09-21 (×20): qty 1

## 2017-09-21 NOTE — Progress Notes (Signed)
Patient ID: John FlockRobert [name], male   DOB: Apr 23, 1981, 37 y.o.   MRN: [id number]     Subjective:  Patient reports pain as mild to moderate.  Patient in bed and in no acute distress.  Objective:   VITALS:   Vitals:   09/20/17 0037 09/20/17 0439 09/20/17 1445 09/20/17 2201  BP: 133/76 129/81 133/89 131/74  Pulse: 81 78 82 74  Resp: 20 16 16 14   Temp: 98.6 F (37 C) (!) 97.5 F (36.4 C) 97.7 F (36.5 C) 97.9 F (36.6 C)  TempSrc: Oral Oral Oral Oral  SpO2: 98% 94% 95% 97%  Weight:      Height:        ABD soft Sensation intact distally Dorsiflexion/Plantar flexion intact Incision: dressing C/D/I and scant drainage Drain still in place The left elbow has a decreasing amount of cellulitis, although has still significant purulence coming from his wound from the emergency room I&D.  Lab Results  Component Value Date   WBC 7.4 09/21/2017   HGB 13.0 09/21/2017   HCT 38.4 (L) 09/21/2017   MCV 92.1 09/21/2017   PLT 269 09/21/2017   BMET    Component Value Date/Time   NA 136 09/21/2017 0635   NA 138 08/07/2014 1236   K 4.2 09/21/2017 0635   K 3.7 08/07/2014 1236   CL 104 09/21/2017 0635   CL 104 08/07/2014 1236   CO2 26 09/21/2017 0635   CO2 28 08/07/2014 1236   GLUCOSE 103 (H) 09/21/2017 0635   GLUCOSE 101 (H) 08/07/2014 1236   BUN 9 09/21/2017 0635   BUN 12 08/07/2014 1236   CREATININE 0.83 09/21/2017 0635   CREATININE 1.09 08/07/2014 1236   CALCIUM 8.5 (L) 09/21/2017 0635   CALCIUM 8.3 (L) 08/07/2014 1236   GFRNONAA >60 09/21/2017 0635   GFRNONAA >60 08/07/2014 1236   GFRNONAA >60 08/10/2013 1745   GFRAA >60 09/21/2017 0635   GFRAA >60 08/07/2014 1236   GFRAA >60 08/10/2013 1745     Assessment/Plan:     Principal Problem:   Substance induced mood disorder (HCC) Active Problems:   Hepatitis C   Schizoaffective disorder, bipolar type (HCC)   Abscess of left upper extremity   Hx of intrinsic asthma   Polysubstance abuse (HCC)   Depression with suicidal  ideation    Plan for OR tomorrow I&D left upper ext Dr Dion SaucierLandau will see tomorrow    Haskel KhanDOUGLAS PARRY, BRANDON 09/21/2017, 12:32 PM  Discussed and agree with above.  He will need formal surgical debridement given the significant purulence is still present in his subcutaneous tissue.  Teryl LucyJoshua Altheia Shafran, MD Cell [phone]

## 2017-09-21 NOTE — Progress Notes (Signed)
PROGRESS NOTE    John FlockRobert [name]  ZOX:[id number] DOB: [birth year] DOA: 09/18/2017 PCP: Patient, No Pcp Per    Brief Narrative: John FlockRobert [name] is a 37 year old male with a past medical history significant for asthma, schizoaffective disorder, depression, polysubstance abuse, tobacco abuse, hepatitis C and gastroesophageal reflux disease; who came to the hospital secondary to left upper extremity pain swelling and erythema.  Patient reports changes in his limp has been present for the last 2 days and worsening.  Patient express no injecting his usual IV drug on the affected area and reported having sudden increase itching/scratching before the development of cellulitic process.  Patient endorses suicidal thoughts, depression/frustration and acute failed attempt on overdose the day prior to admission.  Patient status post I&D, cultures taken, IV antibiotics initiated.  [facility] contacted to admit patient for further evaluation and treatment.        Assessment & Plan:   Principal Problem:   Substance induced mood disorder (HCC) Active Problems:   Hepatitis C   Schizoaffective disorder, bipolar type (HCC)   Abscess of left upper extremity   Hx of intrinsic asthma   Polysubstance abuse (HCC)   Depression with suicidal ideation   Left upper extremity cellulitis: abscess of the left arm: Patient status post I&D, cultures taken, IV antibiotics initiated. Abscess cultures growing MRSA, sensitive to vancomycin.  Wound care seen the patient and recommendations given.  The anti cubital fossa is swollen more compared to yesterday, suspect abscess underneath, orthopedics consulted for repeat I&D, . Appreciate recommendations.     H/o hep C; Outpatient follow up with ID.    Schizoaffective disorder:  Uncontrolled.  Psychiatry consulted and recommendations given. No new issues.    Asthma:  Well controlled.no wheezing heard.   Tobacco abuse:  On nicotine patch.    Polysubstance abuse:  Used  heroin, cocaine and amphetamines and alcohol.  Monitor on CIWA  No signs of withdrawal.  UDS reviewed.      DVT prophylaxis: heparin Code Status: full code.  Family Communication: none at bedside.  [facility]   Consultants:   psychaitry  Procedures: Patient status post I&D, cultures taken, IV antibiotics initiated  Antimicrobials:  Vancomycin.   Subjective: Reports worsening pain in the arm.   Objective: Vitals:   09/20/17 0439 09/20/17 1445 09/20/17 2201 09/21/17 1429  BP: 129/81 133/89 131/74 140/78  Pulse: 78 82 74 88  Resp: 16 16 14 16   Temp: (!) 97.5 F (36.4 C) 97.7 F (36.5 C) 97.9 F (36.6 C) 98.6 F (37 C)  TempSrc: Oral Oral Oral Oral  SpO2: 94% 95% 97% 98%  Weight:      Height:        Intake/Output Summary (Last 24 hours) at 09/21/2017 1729 Last data filed at 09/21/2017 1314 Gross per 24 hour  Intake 2140 ml  Output -  Net 2140 ml   Filed Weights   09/18/17 1314  Weight: 83.2 kg (183 lb 6.8 oz)    Examination:  General exam: Appears in pain in mild distress.  Respiratory system: GOOD AIR ENTRY, no wheezing or rhonchi.  Cardiovascular system: S1 & S2 heard, RRR. No JVD, murmurs, . No pedal edema. Gastrointestinal system: Abdomen is soft NT ND BS+ Central nervous system: Alert and oriented. No focal neurological deficits. Extremities: lerythema has improved, slight bulging of the anti cubital fossa on the left.  Skin: No rashes, lesions or ulcers Psychiatry: Mood & affect appropriate.     Data Reviewed: I have personally  reviewed following labs and imaging studies  CBC: Recent Labs  Lab 09/18/17 0648 09/19/17 0531 09/21/17 0635  WBC 11.0* 6.6 7.4  NEUTROABS 8.3*  --   --   HGB 13.8 11.7* 13.0  HCT 39.0 35.1* 38.4*  MCV 91.8 93.1 92.1  PLT 251 210 269   Basic Metabolic Panel: Recent Labs  Lab 09/18/17 0648 09/19/17 0531 09/20/17 0558 09/21/17 0635  NA 135 138 139 136  K 3.5 3.4* 4.1 4.2  CL  99* 107 108 104  CO2 24 26 26 26   GLUCOSE 93 162* 106* 103*  BUN 10 7 8 9   CREATININE 0.98 0.72 0.75 0.83  CALCIUM 9.0 8.1* 8.4* 8.5*   GFR: Estimated Creatinine Clearance: 133.6 mL/min (by C-G formula based on SCr of 0.83 mg/dL). Liver Function Tests: Recent Labs  Lab 09/18/17 0648 09/19/17 0531  AST 38 28  ALT 47 33  ALKPHOS 75 61  BILITOT 1.1 0.6  PROT 7.8 6.0*  ALBUMIN 4.1 3.0*   No results for input(s): LIPASE, AMYLASE in the last 168 hours. No results for input(s): AMMONIA in the last 168 hours. Coagulation Profile: No results for input(s): INR, PROTIME in the last 168 hours. Cardiac Enzymes: No results for input(s): CKTOTAL, CKMB, CKMBINDEX, TROPONINI in the last 168 hours. BNP (last 3 results) No results for input(s): PROBNP in the last 8760 hours. HbA1C: No results for input(s): HGBA1C in the last 72 hours. CBG: No results for input(s): GLUCAP in the last 168 hours. Lipid Profile: No results for input(s): CHOL, HDL, LDLCALC, TRIG, CHOLHDL, LDLDIRECT in the last 72 hours. Thyroid Function Tests: No results for input(s): TSH, T4TOTAL, FREET4, T3FREE, THYROIDAB in the last 72 hours. Anemia Panel: No results for input(s): VITAMINB12, FOLATE, FERRITIN, TIBC, IRON, RETICCTPCT in the last 72 hours. Sepsis Labs: No results for input(s): PROCALCITON, LATICACIDVEN in the last 168 hours.  Recent Results (from the past 240 hour(s))  Wound or Superficial Culture     Status: None   Collection Time: 09/18/17  6:48 AM  Result Value Ref Range Status   Specimen Description ABSCESS LEFT ARM  Final   Special Requests NONE  Final   Gram Stain   Final    RARE WBC PRESENT, PREDOMINANTLY PMN FEW GRAM POSITIVE COCCI IN PAIRS IN CLUSTERS    Culture   Final    ABUNDANT METHICILLIN RESISTANT STAPHYLOCOCCUS AUREUS MODERATE GROUP B STREP(S.AGALACTIAE)ISOLATED TESTING AGAINST S. AGALACTIAE NOT ROUTINELY PERFORMED DUE TO PREDICTABILITY OF AMP/PEN/VAN SUSCEPTIBILITY. Performed at  [facility], [address], [place], [place] [address]    Report Status 09/20/2017 FINAL  Final   Organism ID, Bacteria METHICILLIN RESISTANT STAPHYLOCOCCUS AUREUS  Final      Susceptibility   Methicillin resistant staphylococcus aureus - MIC*    CIPROFLOXACIN <=0.5 SENSITIVE Sensitive     ERYTHROMYCIN >=8 RESISTANT Resistant     GENTAMICIN <=0.5 SENSITIVE Sensitive     OXACILLIN >=4 RESISTANT Resistant     TETRACYCLINE <=1 SENSITIVE Sensitive     VANCOMYCIN 1 SENSITIVE Sensitive     TRIMETH/SULFA <=10 SENSITIVE Sensitive     CLINDAMYCIN >=8 RESISTANT Resistant     RIFAMPIN <=0.5 SENSITIVE Sensitive     Inducible Clindamycin NEGATIVE Sensitive     * ABUNDANT METHICILLIN RESISTANT STAPHYLOCOCCUS AUREUS  Culture, blood (single) w Reflex to ID Panel     Status: None (Preliminary result)   Collection Time: 09/18/17  6:49 AM  Result Value Ref Range Status   Specimen Description BLOOD RIGHT HAND  Final   Special Requests IN PEDIATRIC BOTTLE Blood Culture adequate volume  Final   Culture   Final    NO GROWTH 3 DAYS Performed at [facility], [address], [place], [place] [address]    Report Status PENDING  Incomplete         Radiology Studies: No results found.      Scheduled Meds: . folic acid  1 mg Oral Daily  . heparin  5,000 Units Subcutaneous Q8H  . multivitamin with minerals  1 tablet Oral Daily  . nicotine  21 mg Transdermal Daily  . pantoprazole  40 mg Oral Daily  . thiamine  100 mg Oral Daily   Or  . thiamine  100 mg Intravenous Daily   Continuous Infusions: . sodium chloride 75 mL/hr at 09/21/17 0409  . vancomycin Stopped (09/21/17 1030)     LOS: 3 days    Time spent: 35 minutes.     Kathlen Mody, MD [facility] Pager [phone]   If 7PM-7AM, please contact night-coverage [email or web address] Password [email or web address] 09/21/2017, 5:29 PM

## 2017-09-22 ENCOUNTER — Encounter (HOSPITAL_COMMUNITY)
Admission: EM | Disposition: A | Discharge status: Home / Self Care | Payer: Self-pay | Source: Home / Self Care | Attending: Internal Medicine

## 2017-09-22 ENCOUNTER — Inpatient Hospital Stay (HOSPITAL_COMMUNITY): Payer: Self-pay | Admitting: Certified Registered Nurse Anesthetist

## 2017-09-22 ENCOUNTER — Encounter (HOSPITAL_COMMUNITY): Payer: Self-pay | Admitting: Certified Registered Nurse Anesthetist

## 2017-09-22 HISTORY — PX: INCISION AND DRAINAGE OF WOUND: SHX1803

## 2017-09-22 LAB — CREATININE, SERUM
Creatinine, Ser: 0.86 mg/dL (ref 0.61–1.24)
GFR calc Af Amer: >60 mL/min (ref >60)

## 2017-09-22 SURGERY — IRRIGATION AND DEBRIDEMENT WOUND
Anesthesia: General | Site: Elbow | Laterality: Left

## 2017-09-22 MED ORDER — SODIUM CHLORIDE 0.9 % IR SOLN
Status: DC | PRN
  Administered 2017-09-22: 3000 mL

## 2017-09-22 MED ORDER — HYDROMORPHONE HCL 1 MG/ML IJ SOLN
INTRAMUSCULAR | Status: AC
  Filled 2017-09-22: qty 1

## 2017-09-22 MED ORDER — LIDOCAINE 2% (20 MG/ML) 5 ML SYRINGE
INTRAMUSCULAR | Status: DC | PRN
  Administered 2017-09-22: 100 mg via INTRAVENOUS

## 2017-09-22 MED ORDER — FENTANYL CITRATE (PF) 100 MCG/2ML IJ SOLN
INTRAMUSCULAR | Status: AC
  Filled 2017-09-22: qty 2

## 2017-09-22 MED ORDER — PROPOFOL 10 MG/ML IV BOLUS
INTRAVENOUS | Status: DC | PRN
  Administered 2017-09-22: 200 mg via INTRAVENOUS

## 2017-09-22 MED ORDER — HYDROMORPHONE HCL 1 MG/ML IJ SOLN
0.2500 mg | INTRAMUSCULAR | Status: DC | PRN
  Administered 2017-09-22 (×4): 0.5 mg via INTRAVENOUS

## 2017-09-22 MED ORDER — LACTATED RINGERS IV SOLN: INTRAVENOUS | Status: DC

## 2017-09-22 MED ORDER — MIDAZOLAM HCL 5 MG/5ML IJ SOLN
INTRAMUSCULAR | Status: DC | PRN
  Administered 2017-09-22: 2 mg via INTRAVENOUS

## 2017-09-22 MED ORDER — ONDANSETRON HCL 4 MG/2ML IJ SOLN: 4.0000 mg | Freq: Four times a day (QID) | INTRAMUSCULAR | Status: DC | PRN

## 2017-09-22 MED ORDER — LIDOCAINE 2% (20 MG/ML) 5 ML SYRINGE
INTRAMUSCULAR | Status: AC
  Filled 2017-09-22: qty 5

## 2017-09-22 MED ORDER — PHENYLEPHRINE 40 MCG/ML (10ML) SYRINGE FOR IV PUSH (FOR BLOOD PRESSURE SUPPORT)
PREFILLED_SYRINGE | INTRAVENOUS | Status: DC | PRN
  Administered 2017-09-22: 40 ug via INTRAVENOUS
  Administered 2017-09-22 (×2): 80 ug via INTRAVENOUS
  Administered 2017-09-22: 40 ug via INTRAVENOUS

## 2017-09-22 MED ORDER — MIDAZOLAM HCL 2 MG/2ML IJ SOLN
INTRAMUSCULAR | Status: AC
  Filled 2017-09-22: qty 2

## 2017-09-22 MED ORDER — LACTATED RINGERS IV SOLN
INTRAVENOUS | Status: DC | PRN
  Administered 2017-09-22: 07:00:00 via INTRAVENOUS

## 2017-09-22 MED ORDER — ONDANSETRON HCL 4 MG/2ML IJ SOLN
INTRAMUSCULAR | Status: DC | PRN
  Administered 2017-09-22: 4 mg via INTRAVENOUS

## 2017-09-22 MED ORDER — OXYCODONE HCL 5 MG PO TABS: 5.0000 mg | ORAL_TABLET | Freq: Once | ORAL | Status: DC | PRN

## 2017-09-22 MED ORDER — CHLORHEXIDINE GLUCONATE 4 % EX LIQD
60.0000 mL | Freq: Once | CUTANEOUS | Status: DC
  Filled 2017-09-22: qty 60

## 2017-09-22 MED ORDER — PHENYLEPHRINE 40 MCG/ML (10ML) SYRINGE FOR IV PUSH (FOR BLOOD PRESSURE SUPPORT)
PREFILLED_SYRINGE | INTRAVENOUS | Status: AC
  Filled 2017-09-22: qty 10

## 2017-09-22 MED ORDER — FENTANYL CITRATE (PF) 100 MCG/2ML IJ SOLN
INTRAMUSCULAR | Status: DC | PRN
  Administered 2017-09-22 (×4): 50 ug via INTRAVENOUS

## 2017-09-22 MED ORDER — PROPOFOL 10 MG/ML IV BOLUS
INTRAVENOUS | Status: AC
Start: 2017-09-22 — End: 2017-09-22
  Filled 2017-09-22: qty 20

## 2017-09-22 MED ORDER — DEXAMETHASONE SODIUM PHOSPHATE 10 MG/ML IJ SOLN
INTRAMUSCULAR | Status: AC
  Filled 2017-09-22: qty 1

## 2017-09-22 MED ORDER — DEXAMETHASONE SODIUM PHOSPHATE 10 MG/ML IJ SOLN
INTRAMUSCULAR | Status: DC | PRN
  Administered 2017-09-22: 10 mg via INTRAVENOUS

## 2017-09-22 MED ORDER — OXYCODONE HCL 5 MG/5ML PO SOLN: 5.0000 mg | Freq: Once | ORAL | Status: DC | PRN

## 2017-09-22 MED ORDER — PROPOFOL 10 MG/ML IV BOLUS
INTRAVENOUS | Status: AC
  Filled 2017-09-22: qty 20

## 2017-09-22 MED ORDER — ONDANSETRON HCL 4 MG/2ML IJ SOLN
INTRAMUSCULAR | Status: AC
  Filled 2017-09-22: qty 2

## 2017-09-22 SURGICAL SUPPLY — 33 items
BANDAGE ACE 4X5 VEL STRL LF (GAUZE/BANDAGES/DRESSINGS) ×3 IMPLANT
BANDAGE ACE 6X5 VEL STRL LF (GAUZE/BANDAGES/DRESSINGS) ×3 IMPLANT
BNDG COHESIVE 4X5 TAN STRL (GAUZE/BANDAGES/DRESSINGS) ×3 IMPLANT
BNDG GAUZE ELAST 4 BULKY (GAUZE/BANDAGES/DRESSINGS) ×3 IMPLANT
BOOTIES KNEE HIGH SLOAN (MISCELLANEOUS) ×6 IMPLANT
COVER SURGICAL LIGHT HANDLE (MISCELLANEOUS) ×3 IMPLANT
DRSG PAD ABDOMINAL 8X10 ST (GAUZE/BANDAGES/DRESSINGS) ×6 IMPLANT
DURAPREP 26ML APPLICATOR (WOUND CARE) ×3 IMPLANT
ELECT REM PT RETURN 15FT ADLT (MISCELLANEOUS) ×3 IMPLANT
EVACUATOR 1/8 PVC DRAIN (DRAIN) ×3 IMPLANT
GAUZE PACKING IODOFORM 1/4X15 (GAUZE/BANDAGES/DRESSINGS) ×3 IMPLANT
GAUZE SPONGE 4X4 12PLY STRL (GAUZE/BANDAGES/DRESSINGS) ×3 IMPLANT
GAUZE XEROFORM 5X9 LF (GAUZE/BANDAGES/DRESSINGS) ×3 IMPLANT
GLOVE BIOGEL PI IND STRL 8 (GLOVE) ×1 IMPLANT
GLOVE BIOGEL PI INDICATOR 8 (GLOVE) ×2
GLOVE ORTHO TXT STRL SZ7.5 (GLOVE) ×3 IMPLANT
GOWN STRL REUS W/TWL LRG LVL3 (GOWN DISPOSABLE) ×3 IMPLANT
HANDPIECE INTERPULSE COAX TIP (DISPOSABLE) ×2
KIT BASIN OR (CUSTOM PROCEDURE TRAY) ×3 IMPLANT
MANIFOLD NEPTUNE II (INSTRUMENTS) ×3 IMPLANT
PACK ORTHO EXTREMITY (CUSTOM PROCEDURE TRAY) ×3 IMPLANT
PAD ABD 8X10 STRL (GAUZE/BANDAGES/DRESSINGS) ×6 IMPLANT
POSITIONER SURGICAL ARM (MISCELLANEOUS) ×3 IMPLANT
SET HNDPC FAN SPRY TIP SCT (DISPOSABLE) ×1 IMPLANT
SPLINT PLASTER CAST XFAST 5X30 (CAST SUPPLIES) ×1 IMPLANT
SPLINT PLASTER XFAST SET 5X30 (CAST SUPPLIES) ×2
SPONGE LAP 18X18 X RAY DECT (DISPOSABLE) ×3 IMPLANT
STOCKINETTE 8 INCH (MISCELLANEOUS) ×3 IMPLANT
SUT ETHILON 3 0 PS 1 (SUTURE) ×9 IMPLANT
SWAB COLLECTION DEVICE MRSA (MISCELLANEOUS) ×3 IMPLANT
SWAB CULTURE ESWAB REG 1ML (MISCELLANEOUS) ×3 IMPLANT
TOWEL OR 17X26 10 PK STRL BLUE (TOWEL DISPOSABLE) ×9 IMPLANT
YANKAUER SUCT BULB TIP NO VENT (SUCTIONS) ×3 IMPLANT

## 2017-09-22 NOTE — Progress Notes (Signed)
PROGRESS NOTE    John [name]  WUJ:[id number] DOB: 04-Nov-1980 DOA: 09/18/2017 PCP: Patient, No Pcp Per    Brief Narrative: John FlockRobert [name] is a 37 year old male with a past medical history significant for asthma, schizoaffective disorder, depression, polysubstance abuse, tobacco abuse, hepatitis C and gastroesophageal reflux disease; who came to the hospital secondary to left upper extremity pain swelling and erythema.  Patient reports changes in his limp has been present for the last 2 days and worsening.  Patient express no injecting his usual IV drug on the affected area and reported having sudden increase itching/scratching before the development of cellulitic process.  Patient endorses suicidal thoughts, depression/frustration and acute failed attempt on overdose the day prior to admission.  Patient status post I&D, cultures taken, IV antibiotics initiated.  [facility] contacted to admit patient for further evaluation and treatment.        Assessment & Plan:   Principal Problem:   Substance induced mood disorder (HCC) Active Problems:   Hepatitis C   Schizoaffective disorder, bipolar type (HCC)   Abscess of left upper extremity   Hx of intrinsic asthma   Polysubstance abuse (HCC)   Depression with suicidal ideation   Left upper extremity cellulitis: abscess of the left arm: Patient status post I&D, cultures taken, IV antibiotics initiated. Abscess cultures growing MRSA, sensitive to vancomycin.  Wound care seen the patient and recommendations given.  The ante cubital space is swollen more compared to admission, suspect abscess underneath, orthopedics consulted for repeat I&D, .  He underwent irrigation and debridement of the wound of the left elbow antecubital space.  Continue with IV antibiotics. Pain control.  He remains afebrile and his WBC count is around 7.4.    H/o hep C; Outpatient follow up with ID.    Schizoaffective disorder:  Uncontrolled.  Psychiatry consulted and  recommendations given. No new issues.    Asthma:  Well controlled.no wheezing heard.   Tobacco abuse:  On nicotine patch.    Polysubstance abuse:  Used heroin, cocaine and amphetamines and alcohol.  Monitor on CIWA  No signs of withdrawal.  UDS reviewed.      DVT prophylaxis: heparin Code Status: full code.  Family Communication: none at bedside.  [facility]   Consultants:   psychaitry  Procedures: I&D of the wound in the left antecubital space.  Antimicrobials:  Vancomycin.   Subjective: No new complaints at this time  Objective: Vitals:   09/21/17 2200 09/22/17 0610 09/22/17 0907 09/22/17 0915  BP: 135/77 114/62 128/67 134/83  Pulse: 90 74 91 76  Resp: 18 18 (!) 22 (!) 27  Temp: 98.3 F (36.8 C) 98.2 F (36.8 C)  (P) 97.6 F (36.4 C)  TempSrc: Oral Oral    SpO2: 95% 96% 95% 98%  Weight:      Height:        Intake/Output Summary (Last 24 hours) at 09/22/2017 0928 Last data filed at 09/22/2017 0855 Gross per 24 hour  Intake 1620 ml  Output 5 ml  Net 1615 ml   Filed Weights   09/18/17 1314  Weight: 83.2 kg (183 lb 6.8 oz)    Examination:  General exam: Calm and comfortable Respiratory system: Good air entry bilateral no wheezing or rhonchi Cardiovascular system: S1 & S2 heard, RRR. No JVD, murmurs, . No pedal edema. Gastrointestinal system: Abdomen is soft, nontender, nondistended with good bowel sounds. Central nervous system: Alert and oriented. No focal neurological deficits. Extremities: Left forearm and arm bandaged. Psychiatry:  Mood & affect appropriate.     Data Reviewed: I have personally reviewed following labs and imaging studies  CBC: Recent Labs  Lab 09/18/17 0648 09/19/17 0531 09/21/17 0635  WBC 11.0* 6.6 7.4  NEUTROABS 8.3*  --   --   HGB 13.8 11.7* 13.0  HCT 39.0 35.1* 38.4*  MCV 91.8 93.1 92.1  PLT 251 210 269   Basic Metabolic Panel: Recent Labs  Lab 09/18/17 0648 09/19/17 0531  09/20/17 0558 09/21/17 0635 09/22/17 0612  NA 135 138 139 136  --   K 3.5 3.4* 4.1 4.2  --   CL 99* 107 108 104  --   CO2 24 26 26 26   --   GLUCOSE 93 162* 106* 103*  --   BUN 10 7 8 9   --   CREATININE 0.98 0.72 0.75 0.83 0.86  CALCIUM 9.0 8.1* 8.4* 8.5*  --    GFR: Estimated Creatinine Clearance: 128.9 mL/min (by C-G formula based on SCr of 0.86 mg/dL). Liver Function Tests: Recent Labs  Lab 09/18/17 0648 09/19/17 0531  AST 38 28  ALT 47 33  ALKPHOS 75 61  BILITOT 1.1 0.6  PROT 7.8 6.0*  ALBUMIN 4.1 3.0*   No results for input(s): LIPASE, AMYLASE in the last 168 hours. No results for input(s): AMMONIA in the last 168 hours. Coagulation Profile: No results for input(s): INR, PROTIME in the last 168 hours. Cardiac Enzymes: No results for input(s): CKTOTAL, CKMB, CKMBINDEX, TROPONINI in the last 168 hours. BNP (last 3 results) No results for input(s): PROBNP in the last 8760 hours. HbA1C: No results for input(s): HGBA1C in the last 72 hours. CBG: No results for input(s): GLUCAP in the last 168 hours. Lipid Profile: No results for input(s): CHOL, HDL, LDLCALC, TRIG, CHOLHDL, LDLDIRECT in the last 72 hours. Thyroid Function Tests: No results for input(s): TSH, T4TOTAL, FREET4, T3FREE, THYROIDAB in the last 72 hours. Anemia Panel: No results for input(s): VITAMINB12, FOLATE, FERRITIN, TIBC, IRON, RETICCTPCT in the last 72 hours. Sepsis Labs: No results for input(s): PROCALCITON, LATICACIDVEN in the last 168 hours.  Recent Results (from the past 240 hour(s))  Wound or Superficial Culture     Status: None   Collection Time: 09/18/17  6:48 AM  Result Value Ref Range Status   Specimen Description ABSCESS LEFT ARM  Final   Special Requests NONE  Final   Gram Stain   Final    RARE WBC PRESENT, PREDOMINANTLY PMN FEW GRAM POSITIVE COCCI IN PAIRS IN CLUSTERS    Culture   Final    ABUNDANT METHICILLIN RESISTANT STAPHYLOCOCCUS AUREUS MODERATE GROUP B  STREP(S.AGALACTIAE)ISOLATED TESTING AGAINST S. AGALACTIAE NOT ROUTINELY PERFORMED DUE TO PREDICTABILITY OF AMP/PEN/VAN SUSCEPTIBILITY. Performed at [facility], [address] [place], [place] [address]    Report Status 09/20/2017 FINAL  Final   Organism ID, Bacteria METHICILLIN RESISTANT STAPHYLOCOCCUS AUREUS  Final      Susceptibility   Methicillin resistant staphylococcus aureus - MIC*    CIPROFLOXACIN <=0.5 SENSITIVE Sensitive     ERYTHROMYCIN >=8 RESISTANT Resistant     GENTAMICIN <=0.5 SENSITIVE Sensitive     OXACILLIN >=4 RESISTANT Resistant     TETRACYCLINE <=1 SENSITIVE Sensitive     VANCOMYCIN 1 SENSITIVE Sensitive     TRIMETH/SULFA <=10 SENSITIVE Sensitive     CLINDAMYCIN >=8 RESISTANT Resistant     RIFAMPIN <=0.5 SENSITIVE Sensitive     Inducible Clindamycin NEGATIVE Sensitive     * ABUNDANT METHICILLIN RESISTANT STAPHYLOCOCCUS AUREUS  Culture, blood (single) w Reflex to ID Panel     Status: None (Preliminary result)   Collection Time: 09/18/17  6:49 AM  Result Value Ref Range Status   Specimen Description BLOOD RIGHT HAND  Final   Special Requests IN PEDIATRIC BOTTLE Blood Culture adequate volume  Final   Culture   Final    NO GROWTH 3 DAYS Performed at [facility], [address] [place], [place] [address]    Report Status PENDING  Incomplete         Radiology Studies: No results found.      Scheduled Meds: . chlorhexidine  60 mL Topical Once  . [MAR Hold] folic acid  1 mg Oral Daily  . [MAR Hold] heparin  5,000 Units Subcutaneous Q8H  . HYDROmorphone      . [MAR Hold] multivitamin with minerals  1 tablet Oral Daily  . [MAR Hold] nicotine  21 mg Transdermal Daily  . [MAR Hold] pantoprazole  40 mg Oral Daily  . [MAR Hold] thiamine  100 mg Oral Daily   Or  . [MAR Hold] thiamine  100 mg Intravenous Daily   Continuous Infusions: . sodium chloride 75 mL/hr at 09/21/17 0409  . lactated ringers    . [MAR Hold] vancomycin Stopped  (09/22/17 0623)     LOS: 4 days    Time spent: 35 minutes.     Kathlen Mody, MD [facility] Pager [phone]   If 7PM-7AM, please contact night-coverage [email or web address] Password [email or web address] 09/22/2017, 9:28 AM

## 2017-09-22 NOTE — Anesthesia Procedure Notes (Signed)
Procedure Name: LMA Insertion Date/Time: 09/22/2017 8:03 AM Performed by: Epimenio SarinJarvela, Raeanne Deschler R, CRNA Pre-anesthesia Checklist: Patient identified, Emergency Drugs available, Suction available, Patient being monitored and Timeout performed Patient Re-evaluated:Patient Re-evaluated prior to induction Oxygen Delivery Method: Circle system utilized Preoxygenation: Pre-oxygenation with 100% oxygen Induction Type: IV induction Ventilation: Mask ventilation without difficulty LMA: LMA with gastric port inserted LMA Size: 4.0 Number of attempts: 1 Dental Injury: Teeth and Oropharynx as per pre-operative assessment

## 2017-09-22 NOTE — Consult Note (Signed)
ORTHOPAEDIC CONSULTATION  REQUESTING PHYSICIAN: Kathlen ModyAkula, Vijaya, MD  Chief Complaint: left arm infection  HPI: John [name] is a 37 y.o. male who complains of increasing left arm pain over the antecubital fossa.  Positive IV drug use, positive fevers, admitted for suicidal ideation.  MRSA positive.  Symptoms of been there for at least the last week.  Pain is rated 9/10.  He reports that scratching was the reason that he got an infection, but he was trying to inject heroin enough to overdose.  Past Medical History:  Diagnosis Date  . Asthma   . CVA (cerebral infarction)    drug induced  . Depression   . Drug addiction (HCC)   . Hepatitis C   . Seizures (HCC)    drug induced   Past Surgical History:  Procedure Laterality Date  . ABDOMINAL SURGERY     Social History   Socioeconomic History  . Marital status: Single    Spouse name: None  . Number of children: None  . Years of education: None  . Highest education level: None  Social Needs  . Financial resource strain: None  . Food insecurity - worry: None  . Food insecurity - inability: None  . Transportation needs - medical: None  . Transportation needs - non-medical: None  Occupational History  . None  Tobacco Use  . Smoking status: Current Every Day Smoker    Packs/day: 2.00    Types: Cigarettes  . Smokeless tobacco: Never Used  Substance and Sexual Activity  . Alcohol use: Yes    Comment: occ  . Drug use: Yes    Types: Marijuana, Methamphetamines, Heroin    Comment: Opana, heroin, and pain medications  . Sexual activity: None  Other Topics Concern  . None  Social History Narrative  . None   Family History  Problem Relation Age of Onset  . Diabetes Paternal Uncle   . Diabetes Paternal Grandmother   . Depression Mother   . Depression Father   . Alcoholism Other    Allergies  Allergen Reactions  . Haldol [Haloperidol Lactate] Swelling  . Tylenol [Acetaminophen] Other (See Comments)    unknown  .  Risperdal [Risperidone] Anxiety    dyskinesia  dyskinesia      Positive ROS: All other systems have been reviewed and were otherwise negative with the exception of those mentioned in the HPI and as above.  Physical Exam: General: Alert, no acute distress Cardiovascular: No pedal edema Respiratory: No cyanosis, no use of accessory musculature GI: No organomegaly, abdomen is soft and non-tender Skin: Decreased cellulitis over the left arm compared to what was written with the ink pen on his skin. Neurologic: Sensation intact distally Psychiatric: Patient is competent for consent with normal mood and affect Lymphatic: No axillary or cervical lymphadenopathy  MUSCULOSKELETAL: Left hand has sensation and motor intact throughout.  Positive purulence with soft tissue accumulation over the antecubital fossa.  Assessment: Principal Problem:   Substance induced mood disorder (HCC) Active Problems:   Hepatitis C   Schizoaffective disorder, bipolar type (HCC)   Abscess of left upper extremity   Hx of intrinsic asthma   Polysubstance abuse (HCC)   Depression with suicidal ideation     Plan: Surgical incision, irrigation, debridement, left antecubital fossa and forearm, possible wound VAC placement.  Severe problem, and has high risks given his coexisting comorbidities, this will be challenging to manage both surgically, medically, and psychiatrically.  Hopefully we can get him on a pass to some type of  recovery.  He may need multiple surgical interventions depending on the severity of the infection.  This procedure has been fully reviewed with the patient and written informed consent has been obtained.     Eulas Post, MD Cell [phone]   09/22/2017 7:47 AM

## 2017-09-22 NOTE — Transfer of Care (Signed)
Immediate Anesthesia Transfer of Care Note  Patient: John FlockRobert [name]  Procedure(s) Performed: IRRIGATION AND DEBRIDEMENT WOUND LEFT ELBOW ANTECUBITAL SPACE (Left Elbow)  Patient Location: PACU  Anesthesia Type:General  Level of Consciousness: awake and alert   Airway & Oxygen Therapy: Patient Spontanous Breathing  Post-op Assessment: Report given to RN and Post -op Vital signs reviewed and stable  Post vital signs: Reviewed and stable  Last Vitals:  Vitals:   09/21/17 2200 09/22/17 0610  BP: 135/77 114/62  Pulse: 90 74  Resp: 18 18  Temp: 36.8 C 36.8 C  SpO2: 95% 96%    Last Pain:  Vitals:   09/22/17 0656  TempSrc:   PainSc: 4       Patients Stated Pain Goal: 2 (09/21/17 1936)  Complications: No apparent anesthesia complications

## 2017-09-22 NOTE — Op Note (Addendum)
09/18/2017 - 09/22/2017  8:53 AM  PATIENT:  John [name]    PRE-OPERATIVE DIAGNOSIS: Left elbow abscess, antecubital fossa  POST-OPERATIVE DIAGNOSIS:  Same  PROCEDURE:  IRRIGATION AND DEBRIDEMENT WOUND LEFT ELBOW ANTECUBITAL SPACE, involving skin, subcutaneous tissue, muscle, and fascia.  Non-excisional debridement through skin, subcutaneous tissue/fascia using scalpel, scissors, and a curette, measuring approximately 6 cm  SURGEON:  Eulas PostJoshua P Tennessee Hanlon, MD  PHYSICIAN ASSISTANT: Janace LittenBrandon Parry, OPA-C, present and scrubbed throughout the case, critical for completion in a timely fashion, and for retraction, instrumentation, and closure.  ANESTHESIA:   General  PREOPERATIVE INDICATIONS:  John [name] is a  37 y.o. male who attempted an IV drug use overdose recently, and was brought into the hospital for suicidal ideations.  He had increasing cellulitis and swelling and pain, and had an emergency room I&D, but still had significant purulence from his left arm and elected for surgical management.  The risks benefits and alternatives were discussed with the patient preoperatively including but not limited to the risks of infection, bleeding, nerve injury, cardiopulmonary complications, the need for revision surgery, among others, and the patient was willing to proceed.  We also discussed the risks for the need for revision surgery, the potential for a wound VAC, the potential spread to osteomyelitis and even amputation.  ESTIMATED BLOOD LOSS: 50 mL  OPERATIVE IMPLANTS: Iodoform gauze  OPERATIVE FINDINGS: There was purulence tracking up across the antecubital fossa proximally into the brachium, most of this was subcutaneous laying just anterior to the fascia, I did not find pus tracking deep to the fascia into the musculature, but simply on top of the fascia.  Distally there was not that much purulence, although the skin had necrosed to some degree.  OPERATIVE PROCEDURE: The patient was brought to the  operating room and placed in the supine position.  General anesthesia was administered.  He is already on IV antibiotics for the treatment of his methicillin-resistant staph aureus.  After timeout was performed, the arm was elevated and tourniquet inflated.  Incision was made across the area of purulence, where the skin had already opened up distally, and I connected to the previous incision that was made from the emergency room physician.  I exposed the deep tissue in that location, and there was not a significant amount of purulence exposed, however I did find that it was tracking more proximally.  I extended the incision proximally to go over the distal brachium, and then encountered substantial purulence with accumulation of fluid.  This was exposed, debrided, I used a Ray-Tec gauze, as well as a scissors, a curette, and also a rongeur in order to remove any infected appearing tissue.  The lateral antebrachial cutaneous nerve was identified and protected and was visualized within the field.  After complete debridement was carried out I irrigated a total of 6 L of fluid through the wound, I then packed the wound with iodoform gauze and closed it loosely with interrupted nylon, total of 2 sutures, simply to reapproximate the skin although leaving plenty of space for drainage.  Sterile dry gauze was applied followed by a splint, the patient was awakened and the tourniquet released and returned to the PACU in stable and satisfactory condition.  There were no complications and he tolerated the procedure well.

## 2017-09-22 NOTE — Anesthesia Preprocedure Evaluation (Signed)
Anesthesia Evaluation  Patient identified by MRN, date of birth, ID band Patient awake    Reviewed: Allergy & Precautions, H&P , NPO status , Patient's Chart, lab work & pertinent test results  Airway Mallampati: II   Neck ROM: full    Dental   Pulmonary asthma , Current Smoker,    breath sounds clear to auscultation       Cardiovascular negative cardio ROS   Rhythm:regular Rate:Normal     Neuro/Psych Seizures -,  PSYCHIATRIC DISORDERS Anxiety Depression Bipolar Disorder Schizophrenia    GI/Hepatic (+)     substance abuse  , Hepatitis -, C  Endo/Other    Renal/GU      Musculoskeletal   Abdominal   Peds  Hematology   Anesthesia Other Findings   Reproductive/Obstetrics                             Anesthesia Physical Anesthesia Plan  ASA: II  Anesthesia Plan: General   Post-op Pain Management:    Induction: Intravenous  PONV Risk Score and Plan: 1 and Ondansetron, Dexamethasone, Midazolam and Treatment may vary due to age or medical condition  Airway Management Planned: LMA  Additional Equipment:   Intra-op Plan:   Post-operative Plan:   Informed Consent: I have reviewed the patients History and Physical, chart, labs and discussed the procedure including the risks, benefits and alternatives for the proposed anesthesia with the patient or authorized representative who has indicated his/her understanding and acceptance.     Plan Discussed with: CRNA, Anesthesiologist and Surgeon  Anesthesia Plan Comments:         Anesthesia Quick Evaluation

## 2017-09-22 NOTE — Progress Notes (Signed)
Pharmacy Antibiotic Note  John FlockRobert [name] is a 37 y.o. male admitted on 09/18/2017 with purulent cellulitis and abscess on LUE from antecubital fossa down to mid arm and known IVDU.  Pharmacy has been consulted for Vancomycin dosing.    Plan:  Continue Vancomycin 1750 mg IV q12h  Will check Vank peak/trough with tonight's dose, given findings of abundant MRSA in abscess Cx  Follow up renal fxn, culture results, and clinical course.   Height: 5\' 11"  (180.3 cm) Weight: 183 lb 6.8 oz (83.2 kg) IBW/kg (Calculated) : 75.3  Temp (24hrs), Avg:98 F (36.7 C), Min:97.5 F (36.4 C), Max:98.6 F (37 C)  Recent Labs  Lab 09/18/17 0648 09/19/17 0531 09/20/17 0558 09/21/17 0635 09/22/17 0612  WBC 11.0* 6.6  --  7.4  --   CREATININE 0.98 0.72 0.75 0.83 0.86    Estimated Creatinine Clearance: 128.9 mL/min (by C-G formula based on SCr of 0.86 mg/dL).    Allergies  Allergen Reactions  . Haldol [Haloperidol Lactate] Swelling  . Tylenol [Acetaminophen] Other (See Comments)    unknown  . Risperdal [Risperidone] Anxiety    dyskinesia  dyskinesia    Antimicrobials this admission: 1/2 >> vanco >>  Dose adjustments this admission: - 1/3 vanco changed form 1250mg  q12h to 1750mg  q12h for improved SCr  Microbiology results: 1/2 BCx (x1): ng4d 1/2 wound cx:  abundant MRSA, moderate GBS   Thank you for allowing pharmacy to be a part of this patient's care.  Bernadene Personrew Bueford Arp, PharmD, BCPS Pager: [phone] 09/22/2017, 3:27 PM

## 2017-09-22 NOTE — Progress Notes (Signed)
Pre Op Nsg Note: Pt arrived into PreOp area, pt identified by arm band, also pt stated name, DOB and procedure to be done. IV site assessed.

## 2017-09-23 ENCOUNTER — Encounter (HOSPITAL_COMMUNITY): Payer: Self-pay | Admitting: Orthopedic Surgery

## 2017-09-23 DIAGNOSIS — R450 Nervousness: Secondary | ICD-10-CM

## 2017-09-23 DIAGNOSIS — R110 Nausea: Secondary | ICD-10-CM

## 2017-09-23 DIAGNOSIS — F419 Anxiety disorder, unspecified: Secondary | ICD-10-CM

## 2017-09-23 DIAGNOSIS — R251 Tremor, unspecified: Secondary | ICD-10-CM

## 2017-09-23 DIAGNOSIS — R109 Unspecified abdominal pain: Secondary | ICD-10-CM

## 2017-09-23 LAB — VANCOMYCIN, TROUGH: Vancomycin Tr: 13 ug/mL — ABNORMAL LOW (ref 15–20)

## 2017-09-23 LAB — CULTURE, BLOOD (SINGLE)
Culture: NO GROWTH
Special Requests: ADEQUATE

## 2017-09-23 LAB — CREATININE, SERUM: Creatinine, Ser: 1.04 mg/dL (ref 0.61–1.24)

## 2017-09-23 LAB — VANCOMYCIN, PEAK: VANCOMYCIN PK: 39 ug/mL (ref 30–40)

## 2017-09-23 MED ORDER — VANCOMYCIN HCL 10 G IV SOLR
1500.0000 mg | Freq: Two times a day (BID) | INTRAVENOUS | Status: DC
  Administered 2017-09-23 – 2017-09-25 (×4): 1500 mg via INTRAVENOUS
  Filled 2017-09-23 (×4): qty 1500

## 2017-09-23 MED ORDER — TRAMADOL HCL 50 MG PO TABS
100.0000 mg | ORAL_TABLET | Freq: Four times a day (QID) | ORAL | Status: DC | PRN
  Administered 2017-09-23 – 2017-09-24 (×5): 100 mg via ORAL
  Filled 2017-09-23 (×5): qty 2

## 2017-09-23 NOTE — Progress Notes (Signed)
LCSW following for inpatient psych placement.  Patient is cleared by psych.   Patient reports he has a plan for treatment and housing.   LCSW signing off. No further CSW needs.   John [name], LSCW [facility] CSW [phone]

## 2017-09-23 NOTE — Progress Notes (Signed)
Date:  September 23, 2017 Chart reviewed for concurrent status and case management needs.  Will continue to follow patient progress. [facility], POD 1 I&D for upper arm cellulitis. Discharge Planning: following for needs  Expected discharge date: September 26 2017 Marcelle SmilingRhonda Shreshta Medley, BSN, [place]3, [place]   [phone]

## 2017-09-23 NOTE — Consult Note (Signed)
[facility] Psych Consult Progress Note  09/23/2017 4:26 PM John [name]  MRN:  [id number] Subjective:   John [name] denies SI and reports feeling safe to go home. His family was present at bedside with his permission. He reports a plan to live with his sister. His sister's house is a drug free environment. He plans to return to [facility] and he has a sponsor. He reports feeling better since he has been clean from using drugs since hospitalization. He reports some anxiety related to treatment for his arm infection. His mother reports no concerns for his safety upon discharge. There is no access to weapons at his sister's house. He is able to safety plan if he has suicidal ideation again.   Principal Problem: Substance induced mood disorder ([place]) Diagnosis:   Patient Active Problem List   Diagnosis Date Noted  . Abscess of left upper extremity [L02.414] 09/18/2017  . Hx of intrinsic asthma [Z87.09] 09/18/2017  . Polysubstance abuse ([place]) [F19.10] 09/18/2017  . Depression with suicidal ideation [F32.9, R45.851] 09/18/2017  . Substance induced mood disorder ([place]) [F19.94] 09/18/2017  . Neuroleptic induced acute dystonia [G24.02, T43.505A] 03/22/2016  . Schizoaffective disorder, bipolar type ([place]) [F25.0] 03/20/2016  . PTSD (post-traumatic stress disorder) [F43.10] 03/20/2016  . Cocaine use disorder, moderate, dependence ([place]) [F14.20] 03/20/2016  . Stimulant use disorder [F15.90] 03/20/2016  . Cannabis use disorder, moderate, dependence ([place]) [F12.20] 03/20/2016  . Alcohol use disorder, moderate, dependence ([place]) [F10.20] 03/20/2016  . Opioid use disorder, moderate, dependence ([place]) [F11.20] 03/20/2016  . Hepatitis C [B19.20] 10/13/2013   Total Time spent with patient: 15 minutes  Past Psychiatric History: Schizoaffective disorder, bipolar type, cocaine abuse, methamphetamine abuse and heroin abuse.  Past Medical History:  Past Medical History:  Diagnosis Date  . Asthma   . CVA (cerebral infarction)     drug induced  . Depression   . Drug addiction ([place])   . Hepatitis C   . Seizures ([place])    drug induced    Past Surgical History:  Procedure Laterality Date  . ABDOMINAL SURGERY    . INCISION AND DRAINAGE OF WOUND Left 09/22/2017   Procedure: IRRIGATION AND DEBRIDEMENT WOUND LEFT ELBOW ANTECUBITAL SPACE;  Surgeon: Marchia Bond, MD;  Location: [facility] ORS;  Service: Orthopedics;  Laterality: Left;   Family History:  Family History  Problem Relation Age of Onset  . Diabetes Paternal Uncle   . Diabetes Paternal Grandmother   . Depression Mother   . Depression Father   . Alcoholism Other    Family Psychiatric  History: Father-bipolar disorder, maternal uncle-schizophrenia and an extensive family history of substance use.   Social History:  Social History   Substance and Sexual Activity  Alcohol Use Yes   Comment: occ     Social History   Substance and Sexual Activity  Drug Use Yes  . Types: Marijuana, Methamphetamines, Heroin   Comment: Opana, heroin, and pain medications    Social History   Socioeconomic History  . Marital status: Single    Spouse name: None  . Number of children: None  . Years of education: None  . Highest education level: None  Social Needs  . Financial resource strain: None  . Food insecurity - worry: None  . Food insecurity - inability: None  . Transportation needs - medical: None  . Transportation needs - non-medical: None  Occupational History  . None  Tobacco Use  . Smoking status: Current Every Day Smoker    Packs/day: 2.00    Types: Cigarettes  .  Smokeless tobacco: Never Used  Substance and Sexual Activity  . Alcohol use: Yes    Comment: occ  . Drug use: Yes    Types: Marijuana, Methamphetamines, Heroin    Comment: Opana, heroin, and pain medications  . Sexual activity: None  Other Topics Concern  . None  Social History Narrative  . None    Sleep: Good  Appetite:  Good  Current Medications: Current Facility-Administered  Medications  Medication Dose Route Frequency Provider Last Rate Last Dose  . 0.9 %  sodium chloride infusion   Intravenous Continuous Barton Dubois, MD 75 mL/hr at 09/23/17 0530    . acetaminophen (TYLENOL) tablet 650 mg  650 mg Oral Q6H PRN Barton Dubois, MD   650 mg at 09/20/17 1523   Or  . acetaminophen (TYLENOL) suppository 650 mg  650 mg Rectal Q6H PRN Barton Dubois, MD      . benzocaine (ORAJEL) 10 % mucosal gel   Mouth/Throat TID PRN Hosie Poisson, MD      . folic acid (FOLVITE) tablet 1 mg  1 mg Oral Daily Barton Dubois, MD   1 mg at 09/23/17 0813  . heparin injection 5,000 Units  5,000 Units Subcutaneous Q8H Barton Dubois, MD   5,000 Units at 09/23/17 1458  . LORazepam (ATIVAN) tablet 2 mg  2 mg Oral Q4H PRN Hosie Poisson, MD       Or  . LORazepam (ATIVAN) injection 1 mg  1 mg Intravenous Q6H PRN Hosie Poisson, MD   1 mg at 09/23/17 1413  . morphine 4 MG/ML injection 1-2 mg  1-2 mg Intravenous Q4H PRN Hosie Poisson, MD   2 mg at 09/23/17 1615  . multivitamin with minerals tablet 1 tablet  1 tablet Oral Daily Barton Dubois, MD   1 tablet at 09/23/17 0813  . nicotine (NICODERM CQ - dosed in mg/24 hours) patch 21 mg  21 mg Transdermal Daily Hosie Poisson, MD   21 mg at 09/23/17 0820  . ondansetron (ZOFRAN) tablet 4 mg  4 mg Oral Q6H PRN Barton Dubois, MD   4 mg at 09/20/17 2154   Or  . ondansetron [facility]) injection 4 mg  4 mg Intravenous Q6H PRN Barton Dubois, MD   4 mg at 09/21/17 0913  . pantoprazole (PROTONIX) EC tablet 40 mg  40 mg Oral Daily Barton Dubois, MD   40 mg at 09/23/17 0813  . promethazine (PHENERGAN) 6.25 MG/5ML syrup 12.5 mg  12.5 mg Oral Q6H PRN Hosie Poisson, MD   12.5 mg at 09/23/17 0831  . thiamine (VITAMIN B-1) tablet 100 mg  100 mg Oral Daily Barton Dubois, MD   100 mg at 09/23/17 9381   Or  . thiamine (B-1) injection 100 mg  100 mg Intravenous Daily Barton Dubois, MD      . traMADol Veatrice Bourbon) tablet 100 mg  100 mg Oral Q6H PRN Hosie Poisson, MD   100 mg  at 09/23/17 1038  . vancomycin (VANCOCIN) 1,500 mg in sodium chloride 0.9 % 500 mL IVPB  1,500 mg Intravenous Q12H Berton Mount, [facility]        Lab Results:  Results for orders placed or performed during the hospital encounter of 09/18/17 (from the past 48 hour(s))  Creatinine, serum     Status: None   Collection Time: 09/22/17  6:12 AM  Result Value Ref Range   Creatinine, Ser 0.86 0.61 - 1.24 mg/dL   GFR calc non Af Amer >60 >60 mL/min   GFR calc Af  Amer >60 >60 mL/min    Comment: (NOTE) The eGFR has been calculated using the CKD EPI equation. This calculation has not been validated in all clinical situations. eGFR's persistently <60 mL/min signify possible Chronic Kidney Disease.   Vancomycin, peak     Status: None   Collection Time: 09/23/17  2:11 AM  Result Value Ref Range   Vancomycin Pk 39 30 - 40 ug/mL  Creatinine, serum     Status: None   Collection Time: 09/23/17  2:11 AM  Result Value Ref Range   Creatinine, Ser 1.04 0.61 - 1.24 mg/dL   GFR calc non Af Amer >60 >60 mL/min   GFR calc Af Amer >60 >60 mL/min    Comment: (NOTE) The eGFR has been calculated using the CKD EPI equation. This calculation has not been validated in all clinical situations. eGFR's persistently <60 mL/min signify possible Chronic Kidney Disease.   Vancomycin, trough     Status: Abnormal   Collection Time: 09/23/17  9:58 AM  Result Value Ref Range   Vancomycin Tr 13 (L) 15 - 20 ug/mL    Blood Alcohol level:  Lab Results  Component Value Date   [facility] <10 09/18/2017   ETH <11 08/06/2014    Musculoskeletal: Strength & Muscle Tone: within normal limits Gait & Station: normal Patient leans: N/A  Psychiatric Specialty Exam: Physical Exam  Nursing note and vitals reviewed. Constitutional: He is oriented to person, place, and time. He appears well-developed and well-nourished.  HENT:  Head: Normocephalic and atraumatic.  Neck: Normal range of motion.  Respiratory: Effort normal.   Musculoskeletal: Normal range of motion.  Neurological: He is alert and oriented to person, place, and time.  Skin: No rash noted.  Psychiatric: He has a normal mood and affect. His speech is normal and behavior is normal. Judgment and thought content normal. Cognition and memory are normal.    Review of Systems  Constitutional: Negative for chills and fever.  Cardiovascular: Negative for chest pain.  Gastrointestinal: Positive for abdominal pain and nausea. Negative for constipation, diarrhea and vomiting.  Neurological: Positive for tremors.  Psychiatric/Behavioral: Positive for substance abuse. Negative for depression, hallucinations and suicidal ideas. The patient is nervous/anxious. The patient does not have insomnia.     Blood pressure 128/78, pulse 76, temperature 97.7 F (36.5 C), temperature source Oral, resp. rate 18, height _0  (1.803 m), weight 83.2 kg (183 lb 6.8 oz), SpO2 97 %.Body mass index is 25.58 kg/m.  General Appearance: Well Groomed, Caucasian male who is wearing a hospital gown and lying in bed. NAD.   Eye Contact:  Good  Speech:  Clear and Coherent and Normal Rate  Volume:  Normal  Mood:  Euthymic  Affect:  Appropriate and Full Range  Thought Process:  Goal Directed and Linear  Orientation:  Full (Time, Place, and Person)  Thought Content:  Logical  Suicidal Thoughts:  No  Homicidal Thoughts:  No  Memory:  Immediate;   Good Recent;   Good Remote;   Good  Judgement:  Fair  Insight:  Fair  Psychomotor Activity:  Normal  Concentration:  Concentration: Good and Attention Span: Good  Recall:  Good  Fund of Knowledge:  Good  Language:  Good  Akathisia:  No  Handed:  Right  AIMS (if indicated):   N/A  Assets:  Communication Skills Desire for Improvement Housing Social Support  ADL's:  Intact  Cognition:  WNL  Sleep:   Okay   Assessment:  John [name] is a  37 y.o. male who was admitted for upper extremity abscess s/p I&D. He endorsed SI and recent  suicide attempt by heroin overdose on admission. According to primary team, he was overheard telling a family member that he reported attempting suicide in the setting of homelessness although this was not the truth. He requested re-evaluation today. He denies SI and reports a plan to live with his sister and receive treatment for substance abuse. He is future oriented. He no longer warrants inpatient psychiatric hospitalization.    Treatment Plan Summary: -Patient is psychiatrically cleared. Psychiatry will sign off on patient at this time. Please consult psychiatry again as needed.  -Please have unit SW provide patient with resources for local psychiatrist and substance abuse treatment.    Faythe Dingwall, DO 09/23/2017, 4:26 PM

## 2017-09-23 NOTE — Progress Notes (Signed)
PROGRESS NOTE    John [name]  BJY:[id number] DOB: 1981-05-04 DOA: 09/18/2017 PCP: Patient, No Pcp Per    Brief Narrative: John [name] is a 37 year old male with a past medical history significant for asthma, schizoaffective disorder, depression, polysubstance abuse, tobacco abuse, hepatitis C and gastroesophageal reflux disease; who came to the hospital secondary to left upper extremity pain swelling and erythema.  Patient reports changes in his limp has been present for the last 2 days and worsening.  Patient express no injecting his usual IV drug on the affected area and reported having sudden increase itching/scratching before the development of cellulitic process.  Patient endorses suicidal thoughts, depression/frustration and acute failed attempt on overdose the day prior to admission.  Patient status post I&D, cultures taken, IV antibiotics initiated.  TRH contacted to admit patient for further evaluation and treatment.        Assessment & Plan:   Principal Problem:   Substance induced mood disorder (HCC) Active Problems:   Hepatitis C   Schizoaffective disorder, bipolar type (HCC)   Abscess of left upper extremity   Hx of intrinsic asthma   Polysubstance abuse (HCC)   Depression with suicidal ideation   Left upper extremity cellulitis: abscess of the left arm: Patient status post I&D, cultures taken, IV antibiotics initiated. Abscess cultures growing MRSA, sensitive to vancomycin.  Wound care seen the patient and recommendations given.  The ante cubital space is swollen more compared to admission, suspect abscess underneath, orthopedics consulted for repeat I&D, .  He underwent irrigation and debridement of the wound of the left elbow antecubital space, and today, the drain was removed.  Pain control.  He remains afebrile and his WBC count is around 7.4. He reports pain not well controlled. Increased tramadol to 100 mg every 6 hours.     H/o hep C; Outpatient follow up  with ID.    Schizoaffective disorder:  Uncontrolled.  Psychiatry consulted and recommendations given. No new issues.    Asthma:  Well controlled.no wheezing heard.   Tobacco abuse:  On nicotine patch.    Polysubstance abuse:  Used heroin, cocaine and amphetamines and alcohol.  Monitor on CIWA  No signs of withdrawal.  UDS reviewed.      DVT prophylaxis: heparin Code Status: full code.  Family Communication: none at bedside.  Disposition Plan: home when medically stable.   Consultants:   psychaitry  Procedures: I&D of the wound in the left antecubital space.  Antimicrobials:  Vancomycin.   Subjective: Pain not well controlled.   Objective: Vitals:   09/22/17 1404 09/22/17 2117 09/23/17 0557 09/23/17 1408  BP: 134/72 125/69 119/81 128/78  Pulse: 96 96 74 76  Resp: 18 19 18 18   Temp: 98.6 F (37 C) 98.1 F (36.7 C) 98.1 F (36.7 C) 97.7 F (36.5 C)  TempSrc: Oral Oral Oral Oral  SpO2: 98% 96% 95% 97%  Weight:      Height:        Intake/Output Summary (Last 24 hours) at 09/23/2017 1827 Last data filed at 09/23/2017 1700 Gross per 24 hour  Intake 3080 ml  Output -  Net 3080 ml   Filed Weights   09/18/17 1314  Weight: 83.2 kg (183 lb 6.8 oz)    Examination:  General exam: Calm and comfortable Respiratory system: Good air entry bilateral no wheezing or rhonchi Cardiovascular system: S1 & S2 heard, RRR. No JVD, murmurs, . No pedal edema. Gastrointestinal system: abd is soft NT ND BS+ Central nervous system: Alert and  oriented. NON FOCAL.  Extremities: Left forearm and arm bandaged. Drain removed today.  Psychiatry: Mood & affect appropriate.     Data Reviewed: I have personally reviewed following labs and imaging studies  CBC: Recent Labs  Lab 09/18/17 0648 09/19/17 0531 09/21/17 0635  WBC 11.0* 6.6 7.4  NEUTROABS 8.3*  --   --   HGB 13.8 11.7* 13.0  HCT 39.0 35.1* 38.4*  MCV 91.8 93.1 92.1  PLT 251 210 269   Basic Metabolic  Panel: Recent Labs  Lab 09/18/17 0648 09/19/17 0531 09/20/17 0558 09/21/17 0635 09/22/17 0612 09/23/17 0211  NA 135 138 139 136  --   --   K 3.5 3.4* 4.1 4.2  --   --   CL 99* 107 108 104  --   --   CO2 24 26 26 26   --   --   GLUCOSE 93 162* 106* 103*  --   --   BUN 10 7 8 9   --   --   CREATININE 0.98 0.72 0.75 0.83 0.86 1.04  CALCIUM 9.0 8.1* 8.4* 8.5*  --   --    GFR: Estimated Creatinine Clearance: 106.6 mL/min (by C-G formula based on SCr of 1.04 mg/dL). Liver Function Tests: Recent Labs  Lab 09/18/17 0648 09/19/17 0531  AST 38 28  ALT 47 33  ALKPHOS 75 61  BILITOT 1.1 0.6  PROT 7.8 6.0*  ALBUMIN 4.1 3.0*   No results for input(s): LIPASE, AMYLASE in the last 168 hours. No results for input(s): AMMONIA in the last 168 hours. Coagulation Profile: No results for input(s): INR, PROTIME in the last 168 hours. Cardiac Enzymes: No results for input(s): CKTOTAL, CKMB, CKMBINDEX, TROPONINI in the last 168 hours. BNP (last 3 results) No results for input(s): PROBNP in the last 8760 hours. HbA1C: No results for input(s): HGBA1C in the last 72 hours. CBG: No results for input(s): GLUCAP in the last 168 hours. Lipid Profile: No results for input(s): CHOL, HDL, LDLCALC, TRIG, CHOLHDL, LDLDIRECT in the last 72 hours. Thyroid Function Tests: No results for input(s): TSH, T4TOTAL, FREET4, T3FREE, THYROIDAB in the last 72 hours. Anemia Panel: No results for input(s): VITAMINB12, FOLATE, FERRITIN, TIBC, IRON, RETICCTPCT in the last 72 hours. Sepsis Labs: No results for input(s): PROCALCITON, LATICACIDVEN in the last 168 hours.  Recent Results (from the past 240 hour(s))  Wound or Superficial Culture     Status: None   Collection Time: 09/18/17  6:48 AM  Result Value Ref Range Status   Specimen Description ABSCESS LEFT ARM  Final   Special Requests NONE  Final   Gram Stain   Final    RARE WBC PRESENT, PREDOMINANTLY PMN FEW GRAM POSITIVE COCCI IN PAIRS IN CLUSTERS     Culture   Final    ABUNDANT METHICILLIN RESISTANT STAPHYLOCOCCUS AUREUS MODERATE GROUP B STREP(S.AGALACTIAE)ISOLATED TESTING AGAINST S. AGALACTIAE NOT ROUTINELY PERFORMED DUE TO PREDICTABILITY OF AMP/PEN/VAN SUSCEPTIBILITY. Performed at [facility], [address] [place], [place] [address]    Report Status 09/20/2017 FINAL  Final   Organism ID, Bacteria METHICILLIN RESISTANT STAPHYLOCOCCUS AUREUS  Final      Susceptibility   Methicillin resistant staphylococcus aureus - MIC*    CIPROFLOXACIN <=0.5 SENSITIVE Sensitive     ERYTHROMYCIN >=8 RESISTANT Resistant     GENTAMICIN <=0.5 SENSITIVE Sensitive     OXACILLIN >=4 RESISTANT Resistant     TETRACYCLINE <=1 SENSITIVE Sensitive     VANCOMYCIN 1 SENSITIVE Sensitive  TRIMETH/SULFA <=10 SENSITIVE Sensitive     CLINDAMYCIN >=8 RESISTANT Resistant     RIFAMPIN <=0.5 SENSITIVE Sensitive     Inducible Clindamycin NEGATIVE Sensitive     * ABUNDANT METHICILLIN RESISTANT STAPHYLOCOCCUS AUREUS  Culture, blood (single) w Reflex to ID Panel     Status: None   Collection Time: 09/18/17  6:49 AM  Result Value Ref Range Status   Specimen Description BLOOD RIGHT HAND  Final   Special Requests IN PEDIATRIC BOTTLE Blood Culture adequate volume  Final   Culture   Final    NO GROWTH 5 DAYS Performed at [facility], [address] [place], [place] [address]    Report Status 09/23/2017 FINAL  Final         Radiology Studies: No results found.      Scheduled Meds: . folic acid  1 mg Oral Daily  . heparin  5,000 Units Subcutaneous Q8H  . multivitamin with minerals  1 tablet Oral Daily  . nicotine  21 mg Transdermal Daily  . pantoprazole  40 mg Oral Daily  . thiamine  100 mg Oral Daily   Or  . thiamine  100 mg Intravenous Daily   Continuous Infusions: . sodium chloride 75 mL/hr at 09/23/17 0530  . vancomycin       LOS: 5 days    Time spent: 35 minutes.     John ModyVijaya Mamta Rimmer, MD [facility] Pager  [phone]   If 7PM-7AM, please contact night-coverage [email or web address] Password [email or web address] 09/23/2017, 6:27 PM

## 2017-09-23 NOTE — Progress Notes (Signed)
Pharmacy Antibiotic Note  John FlockRobert [name] is a 37 y.o. male admitted on 09/18/2017 with purulent cellulitis and abscess on LUE from antecubital fossa down to mid arm and known IVDU.  Pharmacy has been consulted for Vancomycin dosing.    Day #6 vancomycin  Renal: Scr trending up slowly (frequent urination documented)  Vanco Peak =  Trough = 13, calculated AUC 612 (goal ~500)  Plan:  Based on levels Change Vancomycin to 1500 mg IV q12h  Will check Vank peak/trough with tonight's dose, given findings of abundant MRSA in abscess Cx  Follow up renal fxn, culture results, and clinical course.   Height: 5\' 11"  (180.3 cm) Weight: 183 lb 6.8 oz (83.2 kg) IBW/kg (Calculated) : 75.3  Temp (24hrs), Avg:98.3 F (36.8 C), Min:98.1 F (36.7 C), Max:98.6 F (37 C)  Recent Labs  Lab 09/18/17 0648 09/19/17 0531 09/20/17 0558 09/21/17 0635 09/22/17 0612 09/23/17 0211 09/23/17 0958  WBC 11.0* 6.6  --  7.4  --   --   --   CREATININE 0.98 0.72 0.75 0.83 0.86 1.04  --   VANCOTROUGH  --   --   --   --   --   --  13*  VANCOPEAK  --   --   --   --   --  39  --     Estimated Creatinine Clearance: 106.6 mL/min (by C-G formula based on SCr of 1.04 mg/dL).    Allergies  Allergen Reactions  . Haldol [Haloperidol Lactate] Swelling  . Tylenol [Acetaminophen] Other (See Comments)    unknown  . Risperdal [Risperidone] Anxiety    dyskinesia  dyskinesia    Antimicrobials this admission: 1/2 >> vanco >>  Dose adjustments this admission: - 1/3 vanco changed form 1250mg  q12h to 1750mg  q12h for improved SCr - 1/7 dose adjusted to 1500mg  IV q12h  Microbiology results: 1/2 BCx (x1): ng4d 1/2 wound cx:  abundant MRSA, moderate GBS   Thank you for allowing pharmacy to be a part of this patient's care.  Juliette Alcideustin Zeigler, PharmD, BCPS.   Pager: [phone] 09/23/2017 10:55 AM

## 2017-09-23 NOTE — Progress Notes (Signed)
Patient ID: Artis FlockRobert Minks, male   DOB: [birth year], 37 y.o.   MRN: [id number]     Subjective:  Patient reports pain as mild to moderate.  Pain is in the anterior elbow. States that this is not improved  Objective:   VITALS:   Vitals:   09/22/17 1028 09/22/17 1404 09/22/17 2117 09/23/17 0557  BP: 128/74 134/72 125/69 119/81  Pulse: 67 96 96 74  Resp: 20 18 19 18   Temp: 98 F (36.7 C) 98.6 F (37 C) 98.1 F (36.7 C) 98.1 F (36.7 C)  TempSrc:  Oral Oral Oral  SpO2: 95% 98% 96% 95%  Weight:      Height:        ABD soft Sensation intact distally Dorsiflexion/Plantar flexion intact Incision: dressing C/D/I and no drainage Drain in place  Lab Results  Component Value Date   WBC 7.4 09/21/2017   HGB 13.0 09/21/2017   HCT 38.4 (L) 09/21/2017   MCV 92.1 09/21/2017   PLT 269 09/21/2017   BMET    Component Value Date/Time   NA 136 09/21/2017 0635   NA 138 08/07/2014 1236   K 4.2 09/21/2017 0635   K 3.7 08/07/2014 1236   CL 104 09/21/2017 0635   CL 104 08/07/2014 1236   CO2 26 09/21/2017 0635   CO2 28 08/07/2014 1236   GLUCOSE 103 (H) 09/21/2017 0635   GLUCOSE 101 (H) 08/07/2014 1236   BUN 9 09/21/2017 0635   BUN 12 08/07/2014 1236   CREATININE 1.04 09/23/2017 0211   CREATININE 1.09 08/07/2014 1236   CALCIUM 8.5 (L) 09/21/2017 0635   CALCIUM 8.3 (L) 08/07/2014 1236   GFRNONAA >60 09/23/2017 0211   GFRNONAA >60 08/07/2014 1236   GFRNONAA >60 08/10/2013 1745   GFRAA >60 09/23/2017 0211   GFRAA >60 08/07/2014 1236   GFRAA >60 08/10/2013 1745     Assessment/Plan: 1 Day Post-Op   Principal Problem:   Substance induced mood disorder (HCC) Active Problems:   Hepatitis C   Schizoaffective disorder, bipolar type (HCC)   Abscess of left upper extremity   Hx of intrinsic asthma   Polysubstance abuse (HCC)   Depression with suicidal ideation   Advance diet Up with therapy Continue plan per medicine Plan for removal of drain today  Continue  splint   Haskel KhanDOUGLAS PARRY, BRANDON 09/23/2017, 9:14 AM  Discussed and agree with above, will need to monitor wounds, possibility of repeat surgical intervention with repeat I&D down the line if he is not improving.  Teryl LucyJoshua Nivaan Dicenzo, MD Cell [phone]

## 2017-09-24 LAB — CREATININE, SERUM
CREATININE: 0.79 mg/dL (ref 0.61–1.24)
GFR calc Af Amer: >60 mL/min (ref >60)

## 2017-09-24 LAB — MRSA PCR SCREENING: MRSA BY PCR: NEGATIVE

## 2017-09-24 NOTE — Progress Notes (Signed)
Patient ID: John [name], male   DOB: 1981-01-31, 37 y.o.   MRN: [id number]     Subjective:  Patient reports pain as mild to moderate.  patient in bed and in no acute distress  Objective:   VITALS:   Vitals:   09/23/17 2032 09/24/17 0547 09/24/17 0848 09/24/17 1420  BP: (!) 132/92 125/80 108/60 123/70  Pulse: 86 75 66 77  Resp: 18 18 20 20   Temp: 97.8 F (36.6 C)  97.7 F (36.5 C) (!) 97.4 F (36.3 C)  TempSrc: Oral  Oral Oral  SpO2: 95% 98% 97% 97%  Weight:      Height:        ABD soft Sensation intact distally Dorsiflexion/Plantar flexion intact Incision: dressing C/D/I and no drainage Wound better no drainage no oder  Lab Results  Component Value Date   WBC 7.4 09/21/2017   HGB 13.0 09/21/2017   HCT 38.4 (L) 09/21/2017   MCV 92.1 09/21/2017   PLT 269 09/21/2017   BMET    Component Value Date/Time   NA 136 09/21/2017 0635   NA 138 08/07/2014 1236   K 4.2 09/21/2017 0635   K 3.7 08/07/2014 1236   CL 104 09/21/2017 0635   CL 104 08/07/2014 1236   CO2 26 09/21/2017 0635   CO2 28 08/07/2014 1236   GLUCOSE 103 (H) 09/21/2017 0635   GLUCOSE 101 (H) 08/07/2014 1236   BUN 9 09/21/2017 0635   BUN 12 08/07/2014 1236   CREATININE 0.79 09/24/2017 0637   CREATININE 1.09 08/07/2014 1236   CALCIUM 8.5 (L) 09/21/2017 0635   CALCIUM 8.3 (L) 08/07/2014 1236   GFRNONAA >60 09/24/2017 0637   GFRNONAA >60 08/07/2014 1236   GFRNONAA >60 08/10/2013 1745   GFRAA >60 09/24/2017 0637   GFRAA >60 08/07/2014 1236   GFRAA >60 08/10/2013 1745     Assessment/Plan: 2 Days Post-Op   Principal Problem:   Substance induced mood disorder (HCC) Active Problems:   Hepatitis C   Schizoaffective disorder, bipolar type (HCC)   Abscess of left upper extremity   Hx of intrinsic asthma   Polysubstance abuse (HCC)   Depression with suicidal ideation   Advance diet Up with therapy Continue plan per medicine May plan for tomorrow DC if clear with medicine Patient has  trans[facility] Follow up with Dr Dion SaucierLandau Monday 09-30-17   John [name], John [name] 09/24/2017, 4:46 PM  Discussed and reviewed clinical pictures, and agree with above. Teryl LucyJoshua Callia Swim, MD Cell [phone]

## 2017-09-24 NOTE — Anesthesia Postprocedure Evaluation (Signed)
Anesthesia Post Note  Patient: Artis FlockRobert Delcarlo  Procedure(s) Performed: IRRIGATION AND DEBRIDEMENT WOUND LEFT ELBOW ANTECUBITAL SPACE (Left Elbow)     Patient location during evaluation: PACU Anesthesia Type: General Level of consciousness: awake and alert Pain management: pain level controlled Vital Signs Assessment: post-procedure vital signs reviewed and stable Respiratory status: spontaneous breathing, nonlabored ventilation, respiratory function stable and patient connected to nasal cannula oxygen Cardiovascular status: blood pressure returned to baseline and stable Postop Assessment: no apparent nausea or vomiting Anesthetic complications: no    Last Vitals:  Vitals:   09/24/17 0547 09/24/17 0848  BP: 125/80 108/60  Pulse: 75 66  Resp: 18 20  Temp:  36.5 C  SpO2: 98% 97%    Last Pain:  Vitals:   09/24/17 0848  TempSrc: Oral  PainSc: 6                  Aanshi Batchelder S

## 2017-09-24 NOTE — Progress Notes (Signed)
PROGRESS NOTE    John [name]  ZOX:[id number] DOB: 1980/10/01 DOA: 09/18/2017 PCP: Patient, No Pcp Per    Brief Narrative: John [name] is a 37 year old male with a past medical history significant for asthma, schizoaffective disorder, depression, polysubstance abuse, tobacco abuse, hepatitis C and gastroesophageal reflux disease; who came to the hospital secondary to left upper extremity pain swelling and erythema.  Patient reports changes in his limp has been present for the last 2 days and worsening.  Patient express no injecting his usual IV drug on the affected area and reported having sudden increase itching/scratching before the development of cellulitic process.  Patient endorses suicidal thoughts, depression/frustration and acute failed attempt on overdose the day prior to admission.  Patient status post I&D, cultures taken, IV antibiotics initiated.  TRH contacted to admit patient for further evaluation and treatment.        Assessment & Plan:   Principal Problem:   Substance induced mood disorder (HCC) Active Problems:   Hepatitis C   Schizoaffective disorder, bipolar type (HCC)   Abscess of left upper extremity   Hx of intrinsic asthma   Polysubstance abuse (HCC)   Depression with suicidal ideation   Left upper extremity cellulitis: abscess of the left arm: Patient status post I&D, cultures taken, IV antibiotics initiated. Abscess cultures growing MRSA, sensitive to vancomycin.  Wound care seen the patient and recommendations given.  The ante cubital space is swollen more compared to admission, suspect abscess underneath, orthopedics consulted for repeat I&D, .  He underwent irrigation and debridement of the wound of the left elbow antecubital space on 1/6 and drain placed, which was removed on 1/7 Pain control.  He remains afebrile and his WBC count is around 7.4. Pain control with IV morphine and oral tramadol.  Plan for discharge in am as ortho has cleared him.    Discharge the patient on oral bactrim for MRSA  And keflex for streptococci.     H/o hep C; Outpatient follow up with ID.    Schizoaffective disorder:  Uncontrolled.  Psychiatry consulted and recommendations given. No new issues. Psychiatry has cleared him for discharge home and resources provided by CSW.    Asthma:  Well controlled.no wheezing heard.   Tobacco abuse:  On nicotine patch.    Polysubstance abuse:  Used heroin, cocaine and amphetamines and alcohol.  Monitor on CIWA  No signs of withdrawal.  UDS reviewed.      DVT prophylaxis: heparin Code Status: full code.  Family Communication: none at bedside.  Disposition Plan: home TOMORROW.   Consultants:   psychaitry  Procedures: I&D of the wound in the left antecubital space.  Antimicrobials:  Vancomycin.   Subjective: Wants to go home.   Objective: Vitals:   09/23/17 2032 09/24/17 0547 09/24/17 0848 09/24/17 1420  BP: (!) 132/92 125/80 108/60 123/70  Pulse: 86 75 66 77  Resp: 18 18 20 20   Temp: 97.8 F (36.6 C)  97.7 F (36.5 C) (!) 97.4 F (36.3 C)  TempSrc: Oral  Oral Oral  SpO2: 95% 98% 97% 97%  Weight:      Height:        Intake/Output Summary (Last 24 hours) at 09/24/2017 1938 Last data filed at 09/24/2017 1600 Gross per 24 hour  Intake 2725 ml  Output -  Net 2725 ml   Filed Weights   09/18/17 1314  Weight: 83.2 kg (183 lb 6.8 oz)    Examination:  General exam: Calm and comfortable, not in distress.  Respiratory  system: air entry fair, no wheezing or rhonchi.  Cardiovascular system: S1 & S2 heard, RRR. No JVD, murmurs, . No pedal edema. Gastrointestinal system: abd is soft NT ND BS+ Central nervous system: Alert and oriented. Non focal.  Extremities: Left forearm and arm bandaged. Drain removed.  Psychiatry: Mood & affect appropriate. Not in agitation.     Data Reviewed: I have personally reviewed following labs and imaging studies  CBC: Recent Labs  Lab 09/18/17 0648  09/19/17 0531 09/21/17 0635  WBC 11.0* 6.6 7.4  NEUTROABS 8.3*  --   --   HGB 13.8 11.7* 13.0  HCT 39.0 35.1* 38.4*  MCV 91.8 93.1 92.1  PLT 251 210 269   Basic Metabolic Panel: Recent Labs  Lab 09/18/17 0648 09/19/17 0531 09/20/17 0558 09/21/17 0635 09/22/17 0612 09/23/17 0211 09/24/17 0637  NA 135 138 139 136  --   --   --   K 3.5 3.4* 4.1 4.2  --   --   --   CL 99* 107 108 104  --   --   --   CO2 24 26 26 26   --   --   --   GLUCOSE 93 162* 106* 103*  --   --   --   BUN 10 7 8 9   --   --   --   CREATININE 0.98 0.72 0.75 0.83 0.86 1.04 0.79  CALCIUM 9.0 8.1* 8.4* 8.5*  --   --   --    GFR: Estimated Creatinine Clearance: 138.6 mL/min (by C-G formula based on SCr of 0.79 mg/dL). Liver Function Tests: Recent Labs  Lab 09/18/17 0648 09/19/17 0531  AST 38 28  ALT 47 33  ALKPHOS 75 61  BILITOT 1.1 0.6  PROT 7.8 6.0*  ALBUMIN 4.1 3.0*   No results for input(s): LIPASE, AMYLASE in the last 168 hours. No results for input(s): AMMONIA in the last 168 hours. Coagulation Profile: No results for input(s): INR, PROTIME in the last 168 hours. Cardiac Enzymes: No results for input(s): CKTOTAL, CKMB, CKMBINDEX, TROPONINI in the last 168 hours. BNP (last 3 results) No results for input(s): PROBNP in the last 8760 hours. HbA1C: No results for input(s): HGBA1C in the last 72 hours. CBG: No results for input(s): GLUCAP in the last 168 hours. Lipid Profile: No results for input(s): CHOL, HDL, LDLCALC, TRIG, CHOLHDL, LDLDIRECT in the last 72 hours. Thyroid Function Tests: No results for input(s): TSH, T4TOTAL, FREET4, T3FREE, THYROIDAB in the last 72 hours. Anemia Panel: No results for input(s): VITAMINB12, FOLATE, FERRITIN, TIBC, IRON, RETICCTPCT in the last 72 hours. Sepsis Labs: No results for input(s): PROCALCITON, LATICACIDVEN in the last 168 hours.  Recent Results (from the past 240 hour(s))  Wound or Superficial Culture     Status: None   Collection Time: 09/18/17   6:48 AM  Result Value Ref Range Status   Specimen Description ABSCESS LEFT ARM  Final   Special Requests NONE  Final   Gram Stain   Final    RARE WBC PRESENT, PREDOMINANTLY PMN FEW GRAM POSITIVE COCCI IN PAIRS IN CLUSTERS    Culture   Final    ABUNDANT METHICILLIN RESISTANT STAPHYLOCOCCUS AUREUS MODERATE GROUP B STREP(S.AGALACTIAE)ISOLATED TESTING AGAINST S. AGALACTIAE NOT ROUTINELY PERFORMED DUE TO PREDICTABILITY OF AMP/PEN/VAN SUSCEPTIBILITY. Performed at [facility], [address] [place], [place] [address]    Report Status 09/20/2017 FINAL  Final   Organism ID, Bacteria METHICILLIN RESISTANT STAPHYLOCOCCUS AUREUS  Final  Susceptibility   Methicillin resistant staphylococcus aureus - MIC*    CIPROFLOXACIN <=0.5 SENSITIVE Sensitive     ERYTHROMYCIN >=8 RESISTANT Resistant     GENTAMICIN <=0.5 SENSITIVE Sensitive     OXACILLIN >=4 RESISTANT Resistant     TETRACYCLINE <=1 SENSITIVE Sensitive     VANCOMYCIN 1 SENSITIVE Sensitive     TRIMETH/SULFA <=10 SENSITIVE Sensitive     CLINDAMYCIN >=8 RESISTANT Resistant     RIFAMPIN <=0.5 SENSITIVE Sensitive     Inducible Clindamycin NEGATIVE Sensitive     * ABUNDANT METHICILLIN RESISTANT STAPHYLOCOCCUS AUREUS  Culture, blood (single) w Reflex to ID Panel     Status: None   Collection Time: 09/18/17  6:49 AM  Result Value Ref Range Status   Specimen Description BLOOD RIGHT HAND  Final   Special Requests IN PEDIATRIC BOTTLE Blood Culture adequate volume  Final   Culture   Final    NO GROWTH 5 DAYS Performed at [facility], [address] [place], [place] [address]    Report Status 09/23/2017 FINAL  Final         Radiology Studies: No results found.      Scheduled Meds: . folic acid  1 mg Oral Daily  . heparin  5,000 Units Subcutaneous Q8H  . multivitamin with minerals  1 tablet Oral Daily  . nicotine  21 mg Transdermal Daily  . pantoprazole  40 mg Oral Daily  . thiamine  100 mg Oral Daily   Or  .  thiamine  100 mg Intravenous Daily   Continuous Infusions: . sodium chloride 75 mL/hr at 09/23/17 0530  . vancomycin Stopped (09/24/17 1455)     LOS: 6 days    Time spent: 35 minutes.     Kathlen Mody, MD [facility] Pager [phone]   If 7PM-7AM, please contact night-coverage [email or web address] Password [facility] 09/24/2017, 7:38 PM

## 2017-09-25 DIAGNOSIS — F1910 Other psychoactive substance abuse, uncomplicated: Secondary | ICD-10-CM

## 2017-09-25 DIAGNOSIS — F250 Schizoaffective disorder, bipolar type: Secondary | ICD-10-CM

## 2017-09-25 DIAGNOSIS — Z8709 Personal history of other diseases of the respiratory system: Secondary | ICD-10-CM

## 2017-09-25 DIAGNOSIS — B182 Chronic viral hepatitis C: Secondary | ICD-10-CM

## 2017-09-25 DIAGNOSIS — F1994 Other psychoactive substance use, unspecified with psychoactive substance-induced mood disorder: Secondary | ICD-10-CM

## 2017-09-25 DIAGNOSIS — L02414 Cutaneous abscess of left upper limb: Principal | ICD-10-CM

## 2017-09-25 LAB — BASIC METABOLIC PANEL
Anion gap: 6 (ref 5–15)
BUN: 10 mg/dL (ref 6–20)
CO2: 29 mmol/L (ref 22–32)
CREATININE: 0.85 mg/dL (ref 0.61–1.24)
Calcium: 8.3 mg/dL — ABNORMAL LOW (ref 8.9–10.3)
Chloride: 102 mmol/L (ref 101–111)
GFR calc Af Amer: >60 mL/min (ref >60)
Glucose, Bld: 91 mg/dL (ref 65–99)
Potassium: 4.1 mmol/L (ref 3.5–5.1)
SODIUM: 137 mmol/L (ref 135–145)

## 2017-09-25 LAB — CBC
HCT: 37.3 % — ABNORMAL LOW (ref 39.0–52.0)
Hemoglobin: 12.5 g/dL — ABNORMAL LOW (ref 13.0–17.0)
MCH: 31.3 pg (ref 26.0–34.0)
MCHC: 33.5 g/dL (ref 30.0–36.0)
MCV: 93.3 fL (ref 78.0–100.0)
PLATELETS: 247 10*3/uL (ref 150–400)
RBC: 4 MIL/uL — ABNORMAL LOW (ref 4.22–5.81)
RDW: 13.3 % (ref 11.5–15.5)
WBC: 6.3 10*3/uL (ref 4.0–10.5)

## 2017-09-25 MED ORDER — SULFAMETHOXAZOLE-TRIMETHOPRIM 800-160 MG PO TABS: 1.0000 | ORAL_TABLET | Freq: Two times a day (BID) | ORAL | 0 refills | Status: DC

## 2017-09-25 MED ORDER — CEPHALEXIN 500 MG PO CAPS: 500.0000 mg | ORAL_CAPSULE | Freq: Three times a day (TID) | ORAL | 0 refills | Status: DC

## 2017-09-25 MED ORDER — SULFAMETHOXAZOLE-TRIMETHOPRIM 800-160 MG PO TABS
1.0000 | ORAL_TABLET | Freq: Two times a day (BID) | ORAL | Status: DC
  Filled 2017-09-25 (×2): qty 1

## 2017-09-25 MED ORDER — OXYCODONE HCL 5 MG PO TABS
5.0000 mg | ORAL_TABLET | ORAL | Status: DC | PRN
  Administered 2017-09-25 (×2): 10 mg via ORAL
  Filled 2017-09-25 (×2): qty 2

## 2017-09-25 MED ORDER — IBUPROFEN 600 MG PO TABS: 600.0000 mg | ORAL_TABLET | Freq: Four times a day (QID) | ORAL | 0 refills | Status: DC | PRN

## 2017-09-25 MED ORDER — IBUPROFEN 200 MG PO TABS
600.0000 mg | ORAL_TABLET | Freq: Four times a day (QID) | ORAL | Status: DC | PRN
  Administered 2017-09-25: 600 mg via ORAL
  Filled 2017-09-25: qty 3

## 2017-09-25 MED ORDER — CEPHALEXIN 500 MG PO CAPS: 500.0000 mg | ORAL_CAPSULE | Freq: Three times a day (TID) | ORAL | Status: DC

## 2017-09-25 MED ORDER — OXYCODONE HCL 5 MG PO TABS: 5.0000 mg | ORAL_TABLET | Freq: Four times a day (QID) | ORAL | 0 refills | Status: DC | PRN

## 2017-09-25 NOTE — Progress Notes (Signed)
[id number]/Rhonda Davis,BSN,RN3,CCM/[id number]/Patient discharged to home with wound care instructions/no hhc ordered.
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
## 2017-09-25 NOTE — Progress Notes (Signed)
Patient ID: John FlockRobert [name], male   DOB: 09/29/1980, 37 y.o.   MRN: [id number]     Subjective:  Patient reports pain as mild.  Patient reports improvement.  Denies fever or chills.  Objective:   VITALS:   Vitals:   09/24/17 0848 09/24/17 1420 09/24/17 2137 09/25/17 0623  BP: 108/60 123/70 123/74 117/66  Pulse: 66 77 82 69  Resp: 20 20 19 19   Temp: 97.7 F (36.5 C) (!) 97.4 F (36.3 C) 98.4 F (36.9 C) (!) 97.5 F (36.4 C)  TempSrc: Oral Oral Oral Oral  SpO2: 97% 97% 97% 96%  Weight:      Height:        ABD soft Sensation intact distally Dorsiflexion/Plantar flexion intact Incision: dressing C/D/I and no drainage  Splint in place and should continue until follow up   Lab Results  Component Value Date   WBC 6.3 09/25/2017   HGB 12.5 (L) 09/25/2017   HCT 37.3 (L) 09/25/2017   MCV 93.3 09/25/2017   PLT 247 09/25/2017   BMET    Component Value Date/Time   NA 137 09/25/2017 0619   NA 138 08/07/2014 1236   K 4.1 09/25/2017 0619   K 3.7 08/07/2014 1236   CL 102 09/25/2017 0619   CL 104 08/07/2014 1236   CO2 29 09/25/2017 0619   CO2 28 08/07/2014 1236   GLUCOSE 91 09/25/2017 0619   GLUCOSE 101 (H) 08/07/2014 1236   BUN 10 09/25/2017 0619   BUN 12 08/07/2014 1236   CREATININE 0.85 09/25/2017 0619   CREATININE 1.09 08/07/2014 1236   CALCIUM 8.3 (L) 09/25/2017 0619   CALCIUM 8.3 (L) 08/07/2014 1236   GFRNONAA >60 09/25/2017 0619   GFRNONAA >60 08/07/2014 1236   GFRNONAA >60 08/10/2013 1745   GFRAA >60 09/25/2017 0619   GFRAA >60 08/07/2014 1236   GFRAA >60 08/10/2013 1745     Assessment/Plan: 3 Days Post-Op   Principal Problem:   Substance induced mood disorder (HCC) Active Problems:   Hepatitis C   Schizoaffective disorder, bipolar type (HCC)   Abscess of left upper extremity   Hx of intrinsic asthma   Polysubstance abuse (HCC)   Depression with suicidal ideation   Advance diet Up with therapy DC per medicine Patient hs transportation tonight at  8 PM   John [name], John [name] 09/25/2017, 7:51 AM  Discussed and agree with above.  Dry dressing changes daily.  Return to clinic in 1 week with me. John LucyJoshua Eola Waldrep, MD Cell [phone]

## 2018-02-21 DIAGNOSIS — E162 Hypoglycemia, unspecified: Secondary | ICD-10-CM

## 2018-02-21 DIAGNOSIS — R748 Abnormal levels of other serum enzymes: Secondary | ICD-10-CM

## 2018-02-21 DIAGNOSIS — E876 Hypokalemia: Secondary | ICD-10-CM

## 2018-02-21 DIAGNOSIS — F1910 Other psychoactive substance abuse, uncomplicated: Secondary | ICD-10-CM

## 2018-02-21 DIAGNOSIS — R531 Weakness: Secondary | ICD-10-CM

## 2018-02-21 DIAGNOSIS — M79605 Pain in left leg: Secondary | ICD-10-CM

## 2019-01-26 DIAGNOSIS — F1910 Other psychoactive substance abuse, uncomplicated: Secondary | ICD-10-CM

## 2019-01-26 DIAGNOSIS — M6282 Rhabdomyolysis: Secondary | ICD-10-CM

## 2019-01-26 DIAGNOSIS — R4182 Altered mental status, unspecified: Secondary | ICD-10-CM

## 2019-01-26 DIAGNOSIS — G629 Polyneuropathy, unspecified: Secondary | ICD-10-CM

## 2019-01-27 DIAGNOSIS — R748 Abnormal levels of other serum enzymes: Secondary | ICD-10-CM

## 2019-01-27 DIAGNOSIS — F209 Schizophrenia, unspecified: Secondary | ICD-10-CM

## 2019-01-27 DIAGNOSIS — E871 Hypo-osmolality and hyponatremia: Secondary | ICD-10-CM

## 2019-01-27 DIAGNOSIS — R531 Weakness: Secondary | ICD-10-CM

## 2019-01-27 DIAGNOSIS — B182 Chronic viral hepatitis C: Secondary | ICD-10-CM

## 2019-01-27 DIAGNOSIS — N179 Acute kidney failure, unspecified: Secondary | ICD-10-CM

## 2019-06-12 ENCOUNTER — Emergency Department (HOSPITAL_COMMUNITY): Payer: Self-pay

## 2019-06-12 ENCOUNTER — Inpatient Hospital Stay (HOSPITAL_COMMUNITY)
Admission: EM | Admit: 2019-06-12 | Discharge: 2019-06-18 | DRG: 880 | Disposition: A | Discharge status: Home / Self Care | Payer: HRSA Program | Attending: Family Medicine | Admitting: Family Medicine

## 2019-06-12 ENCOUNTER — Other Ambulatory Visit: Payer: Self-pay

## 2019-06-12 DIAGNOSIS — Z888 Allergy status to other drugs, medicaments and biological substances status: Secondary | ICD-10-CM

## 2019-06-12 DIAGNOSIS — Z590 Homelessness: Secondary | ICD-10-CM

## 2019-06-12 DIAGNOSIS — F1020 Alcohol dependence, uncomplicated: Secondary | ICD-10-CM | POA: Diagnosis not present

## 2019-06-12 DIAGNOSIS — F1920 Other psychoactive substance dependence, uncomplicated: Secondary | ICD-10-CM

## 2019-06-12 DIAGNOSIS — F1120 Opioid dependence, uncomplicated: Secondary | ICD-10-CM | POA: Diagnosis present

## 2019-06-12 DIAGNOSIS — Z818 Family history of other mental and behavioral disorders: Secondary | ICD-10-CM

## 2019-06-12 DIAGNOSIS — R45851 Suicidal ideations: Secondary | ICD-10-CM | POA: Diagnosis not present

## 2019-06-12 DIAGNOSIS — B1920 Unspecified viral hepatitis C without hepatic coma: Secondary | ICD-10-CM | POA: Diagnosis present

## 2019-06-12 DIAGNOSIS — Z8673 Personal history of transient ischemic attack (TIA), and cerebral infarction without residual deficits: Secondary | ICD-10-CM

## 2019-06-12 DIAGNOSIS — F250 Schizoaffective disorder, bipolar type: Secondary | ICD-10-CM | POA: Diagnosis present

## 2019-06-12 DIAGNOSIS — F4310 Post-traumatic stress disorder, unspecified: Secondary | ICD-10-CM | POA: Diagnosis present

## 2019-06-12 DIAGNOSIS — Z886 Allergy status to analgesic agent status: Secondary | ICD-10-CM

## 2019-06-12 DIAGNOSIS — Z915 Personal history of self-harm: Secondary | ICD-10-CM

## 2019-06-12 DIAGNOSIS — F1220 Cannabis dependence, uncomplicated: Secondary | ICD-10-CM | POA: Diagnosis not present

## 2019-06-12 DIAGNOSIS — F1420 Cocaine dependence, uncomplicated: Secondary | ICD-10-CM | POA: Diagnosis present

## 2019-06-12 DIAGNOSIS — U071 COVID-19: Secondary | ICD-10-CM

## 2019-06-12 DIAGNOSIS — G2402 Drug induced acute dystonia: Secondary | ICD-10-CM | POA: Diagnosis present

## 2019-06-12 DIAGNOSIS — Z833 Family history of diabetes mellitus: Secondary | ICD-10-CM

## 2019-06-12 DIAGNOSIS — N179 Acute kidney failure, unspecified: Secondary | ICD-10-CM | POA: Diagnosis present

## 2019-06-12 DIAGNOSIS — F10239 Alcohol dependence with withdrawal, unspecified: Secondary | ICD-10-CM | POA: Diagnosis present

## 2019-06-12 DIAGNOSIS — F329 Major depressive disorder, single episode, unspecified: Secondary | ICD-10-CM

## 2019-06-12 DIAGNOSIS — F17210 Nicotine dependence, cigarettes, uncomplicated: Secondary | ICD-10-CM | POA: Diagnosis present

## 2019-06-12 DIAGNOSIS — L089 Local infection of the skin and subcutaneous tissue, unspecified: Secondary | ICD-10-CM | POA: Diagnosis present

## 2019-06-12 DIAGNOSIS — B182 Chronic viral hepatitis C: Secondary | ICD-10-CM

## 2019-06-12 LAB — CBC WITH DIFFERENTIAL/PLATELET
Abs Immature Granulocytes: 0.04 10*3/uL (ref 0.00–0.07)
Basophils Absolute: 0 10*3/uL (ref 0.0–0.1)
Basophils Relative: 0 %
Eosinophils Absolute: 0.1 10*3/uL (ref 0.0–0.5)
Eosinophils Relative: 1 %
HCT: 46.2 % (ref 39.0–52.0)
Hemoglobin: 15.4 g/dL (ref 13.0–17.0)
Immature Granulocytes: 0 %
Lymphocytes Relative: 24 %
Lymphs Abs: 2.3 10*3/uL (ref 0.7–4.0)
MCH: 30.9 pg (ref 26.0–34.0)
MCHC: 33.3 g/dL (ref 30.0–36.0)
MCV: 92.6 fL (ref 80.0–100.0)
Monocytes Absolute: 0.7 10*3/uL (ref 0.1–1.0)
Monocytes Relative: 7 %
Neutro Abs: 6.4 10*3/uL (ref 1.7–7.7)
Neutrophils Relative %: 68 %
Platelets: 371 10*3/uL (ref 150–400)
RBC: 4.99 MIL/uL (ref 4.22–5.81)
RDW: 12.4 % (ref 11.5–15.5)
WBC: 9.5 10*3/uL (ref 4.0–10.5)
nRBC: 0 % (ref 0.0–0.2)

## 2019-06-12 LAB — COMPREHENSIVE METABOLIC PANEL
ALT: 80 U/L — ABNORMAL HIGH (ref 0–44)
AST: 54 U/L — ABNORMAL HIGH (ref 15–41)
Albumin: 4.1 g/dL (ref 3.5–5.0)
Alkaline Phosphatase: 97 U/L (ref 38–126)
Anion gap: 14 (ref 5–15)
BUN: 10 mg/dL (ref 6–20)
CO2: 23 mmol/L (ref 22–32)
Calcium: 9.7 mg/dL (ref 8.9–10.3)
Chloride: 99 mmol/L (ref 98–111)
Creatinine, Ser: 1.61 mg/dL — ABNORMAL HIGH (ref 0.61–1.24)
GFR calc Af Amer: >60 mL/min (ref >60)
GFR calc non Af Amer: 55 mL/min — ABNORMAL LOW (ref >60)
Glucose, Bld: 105 mg/dL — ABNORMAL HIGH (ref 70–99)
Potassium: 3.7 mmol/L (ref 3.5–5.1)
Sodium: 136 mmol/L (ref 135–145)
Total Bilirubin: 1.2 mg/dL (ref 0.3–1.2)
Total Protein: 8.3 g/dL — ABNORMAL HIGH (ref 6.5–8.1)

## 2019-06-12 LAB — TRIGLYCERIDES: Triglycerides: 75 mg/dL (ref <150)

## 2019-06-12 LAB — ETHANOL: Alcohol, Ethyl (B): <10 mg/dL (ref <10)

## 2019-06-12 LAB — C-REACTIVE PROTEIN: CRP: 1.1 mg/dL — ABNORMAL HIGH (ref <1.0)

## 2019-06-12 LAB — FIBRINOGEN: Fibrinogen: 426 mg/dL (ref 210–475)

## 2019-06-12 LAB — LACTIC ACID, PLASMA
Lactic Acid, Venous: 0.8 mmol/L (ref 0.5–1.9)
Lactic Acid, Venous: 1.5 mmol/L (ref 0.5–1.9)

## 2019-06-12 LAB — LACTATE DEHYDROGENASE: LDH: 179 U/L (ref 98–192)

## 2019-06-12 LAB — GLUCOSE, CAPILLARY: Glucose-Capillary: 81 mg/dL (ref 70–99)

## 2019-06-12 LAB — D-DIMER, QUANTITATIVE: D-Dimer, Quant: 1.9 ug/mL-FEU — ABNORMAL HIGH (ref 0.00–0.50)

## 2019-06-12 LAB — PROCALCITONIN: Procalcitonin: <0.1 ng/mL

## 2019-06-12 LAB — FERRITIN: Ferritin: 87 ng/mL (ref 24–336)

## 2019-06-12 LAB — SARS CORONAVIRUS 2 BY RT PCR (HOSPITAL ORDER, PERFORMED IN ~~LOC~~ HOSPITAL LAB): SARS Coronavirus 2: POSITIVE — AB

## 2019-06-12 MED ORDER — METHOCARBAMOL 500 MG PO TABS: 500.0000 mg | ORAL_TABLET | Freq: Three times a day (TID) | ORAL | Status: AC | PRN

## 2019-06-12 MED ORDER — ENOXAPARIN SODIUM 40 MG/0.4ML ~~LOC~~ SOLN
40.0000 mg | SUBCUTANEOUS | Status: DC
  Administered 2019-06-13 – 2019-06-15 (×3): 40 mg via SUBCUTANEOUS
  Filled 2019-06-12 (×3): qty 0.4

## 2019-06-12 MED ORDER — ALUM & MAG HYDROXIDE-SIMETH 200-200-20 MG/5ML PO SUSP: 30.0000 mL | Freq: Four times a day (QID) | ORAL | Status: DC | PRN

## 2019-06-12 MED ORDER — LOPERAMIDE HCL 2 MG PO CAPS: 2.0000 mg | ORAL_CAPSULE | ORAL | Status: AC | PRN

## 2019-06-12 MED ORDER — VITAMIN B-1 100 MG PO TABS
100.0000 mg | ORAL_TABLET | Freq: Every day | ORAL | Status: DC
  Administered 2019-06-12 – 2019-06-18 (×6): 100 mg via ORAL
  Filled 2019-06-12 (×9): qty 1

## 2019-06-12 MED ORDER — ZOLPIDEM TARTRATE 5 MG PO TABS
5.0000 mg | ORAL_TABLET | Freq: Every evening | ORAL | Status: DC | PRN
  Administered 2019-06-15 – 2019-06-17 (×3): 5 mg via ORAL
  Filled 2019-06-12 (×3): qty 1

## 2019-06-12 MED ORDER — LORAZEPAM 2 MG/ML IJ SOLN
0.0000 mg | Freq: Four times a day (QID) | INTRAMUSCULAR | Status: DC
  Administered 2019-06-13 – 2019-06-14 (×4): 2 mg via INTRAVENOUS
  Filled 2019-06-12 (×4): qty 1

## 2019-06-12 MED ORDER — DICYCLOMINE HCL 20 MG PO TABS
20.0000 mg | ORAL_TABLET | Freq: Four times a day (QID) | ORAL | Status: AC | PRN
  Administered 2019-06-12: 11:00:00 20 mg via ORAL
  Filled 2019-06-12 (×2): qty 1

## 2019-06-12 MED ORDER — THIAMINE HCL 100 MG/ML IJ SOLN
100.0000 mg | Freq: Every day | INTRAMUSCULAR | Status: DC
  Filled 2019-06-12: qty 2

## 2019-06-12 MED ORDER — SODIUM CHLORIDE 0.9 % IV BOLUS
1000.0000 mL | Freq: Once | INTRAVENOUS | Status: AC
  Administered 2019-06-12: 14:00:00 1000 mL via INTRAVENOUS

## 2019-06-12 MED ORDER — IBUPROFEN 600 MG PO TABS
600.0000 mg | ORAL_TABLET | Freq: Three times a day (TID) | ORAL | Status: DC | PRN
  Administered 2019-06-12: 600 mg via ORAL

## 2019-06-12 MED ORDER — ZIPRASIDONE MESYLATE 20 MG IM SOLR
20.0000 mg | Freq: Once | INTRAMUSCULAR | Status: AC
  Administered 2019-06-12: 20 mg via INTRAMUSCULAR
  Filled 2019-06-12: qty 20

## 2019-06-12 MED ORDER — ONDANSETRON HCL 4 MG PO TABS
4.0000 mg | ORAL_TABLET | Freq: Three times a day (TID) | ORAL | Status: DC | PRN
  Administered 2019-06-13 – 2019-06-17 (×3): 4 mg via ORAL
  Filled 2019-06-12 (×4): qty 1

## 2019-06-12 MED ORDER — FOLIC ACID 1 MG PO TABS
1.0000 mg | ORAL_TABLET | Freq: Every day | ORAL | Status: DC
  Administered 2019-06-13 – 2019-06-18 (×5): 1 mg via ORAL
  Filled 2019-06-12 (×6): qty 1

## 2019-06-12 MED ORDER — LORAZEPAM 2 MG/ML IJ SOLN
0.0000 mg | Freq: Two times a day (BID) | INTRAMUSCULAR | Status: DC
  Filled 2019-06-12: qty 1

## 2019-06-12 MED ORDER — LORAZEPAM 1 MG PO TABS: 0.0000 mg | ORAL_TABLET | Freq: Two times a day (BID) | ORAL | Status: DC

## 2019-06-12 MED ORDER — CLONIDINE HCL 0.1 MG PO TABS
0.1000 mg | ORAL_TABLET | Freq: Every day | ORAL | Status: AC
  Administered 2019-06-16 – 2019-06-17 (×2): 0.1 mg via ORAL
  Filled 2019-06-12 (×2): qty 1

## 2019-06-12 MED ORDER — STERILE WATER FOR INJECTION IJ SOLN
INTRAMUSCULAR | Status: AC
  Filled 2019-06-12: qty 10

## 2019-06-12 MED ORDER — SODIUM CHLORIDE 0.9 % IV SOLN
1000.0000 mL | INTRAVENOUS | Status: DC
  Administered 2019-06-12 – 2019-06-13 (×3): 1000 mL via INTRAVENOUS

## 2019-06-12 MED ORDER — NICOTINE 21 MG/24HR TD PT24
21.0000 mg | MEDICATED_PATCH | Freq: Every day | TRANSDERMAL | Status: DC
  Administered 2019-06-12 – 2019-06-18 (×6): 21 mg via TRANSDERMAL
  Filled 2019-06-12 (×8): qty 1

## 2019-06-12 MED ORDER — CLONIDINE HCL 0.1 MG PO TABS
0.1000 mg | ORAL_TABLET | ORAL | Status: AC
  Administered 2019-06-14 – 2019-06-15 (×4): 0.1 mg via ORAL
  Filled 2019-06-12 (×4): qty 1

## 2019-06-12 MED ORDER — SULFAMETHOXAZOLE-TRIMETHOPRIM 800-160 MG PO TABS
1.0000 | ORAL_TABLET | Freq: Two times a day (BID) | ORAL | Status: DC
  Administered 2019-06-12 – 2019-06-17 (×12): 1 via ORAL
  Filled 2019-06-12 (×13): qty 1

## 2019-06-12 MED ORDER — HYDROXYZINE HCL 25 MG PO TABS
25.0000 mg | ORAL_TABLET | Freq: Four times a day (QID) | ORAL | Status: AC | PRN
  Administered 2019-06-12 – 2019-06-17 (×8): 25 mg via ORAL
  Filled 2019-06-12 (×9): qty 1

## 2019-06-12 MED ORDER — LORAZEPAM 1 MG PO TABS
0.0000 mg | ORAL_TABLET | Freq: Four times a day (QID) | ORAL | Status: DC
  Administered 2019-06-12: 3 mg via ORAL
  Filled 2019-06-12: qty 3

## 2019-06-12 MED ORDER — NAPROXEN 250 MG PO TABS
500.0000 mg | ORAL_TABLET | Freq: Two times a day (BID) | ORAL | Status: AC | PRN
  Administered 2019-06-12 – 2019-06-14 (×3): 500 mg via ORAL
  Filled 2019-06-12 (×4): qty 2

## 2019-06-12 MED ORDER — ADULT MULTIVITAMIN W/MINERALS CH
1.0000 | ORAL_TABLET | Freq: Every day | ORAL | Status: DC
  Administered 2019-06-13 – 2019-06-18 (×6): 1 via ORAL
  Filled 2019-06-12 (×6): qty 1

## 2019-06-12 MED ORDER — CLONIDINE HCL 0.1 MG PO TABS
0.1000 mg | ORAL_TABLET | Freq: Four times a day (QID) | ORAL | Status: AC
  Administered 2019-06-12 – 2019-06-14 (×6): 0.1 mg via ORAL
  Filled 2019-06-12 (×6): qty 1

## 2019-06-12 NOTE — BH Assessment (Signed)
[facility] Assessment Progress Note  Case was staffed with Tate NP who recommended patient be observed and monitored.     

## 2019-06-12 NOTE — ED Triage Notes (Signed)
Pt requesting detox from METH and HEROIN-- last used last night- pt shaky, has open sores on arms, face.

## 2019-06-12 NOTE — Progress Notes (Signed)
RN received report from ed. Stated patient has not voided in 10 hours. Requested bladder scan before pt arrival.

## 2019-06-12 NOTE — ED Notes (Signed)
ED TO INPATIENT HANDOFF REPORT  ED Nurse Name and Phone #: William Hamburger, RN [phone]  S Name/Age/Gender John [name] 38 y.o. male Room/Bed: 011C/011C  Code Status   Code Status: Full Code  Home/SNF/Other Home Patient oriented to: self, place and time Is this baseline? No   Triage Complete: Triage complete  Chief Complaint detox,z04.6  Triage Note Pt requesting detox from [facility]-- last used last night- pt shaky, has open sores on arms, face.    Allergies Allergies  Allergen Reactions  . Haldol [Haloperidol Lactate] Swelling  . Tylenol [Acetaminophen] Other (See Comments)    unknown  . Risperdal [Risperidone] Anxiety    dyskinesia  dyskinesia     Level of Care/Admitting Diagnosis ED Disposition    ED Disposition Condition [facility] Area: [place] [[id number]]  Level of Care: Med-Surg [16]  Covid Evaluation: Confirmed COVID Positive  Diagnosis: Suicidal intent [[id number]]  Admitting Physician: Vashti Hey [[id number]]  Attending Physician: Vashti Hey [[id number]]  Estimated length of stay: past midnight tomorrow  Certification:: I certify this patient will need inpatient services for at least 2 midnights  PT Class (Do Not Modify): Inpatient [101]  PT Acc Code (Do Not Modify): Private [1]       B Medical/Surgery History Past Medical History:  Diagnosis Date  . Asthma   . CVA (cerebral infarction)    drug induced  . Depression   . Drug addiction ([place])   . Hepatitis C   . Seizures ([place])    drug induced   Past Surgical History:  Procedure Laterality Date  . ABDOMINAL SURGERY    . INCISION AND DRAINAGE OF WOUND Left 09/22/2017   Procedure: IRRIGATION AND DEBRIDEMENT WOUND LEFT ELBOW ANTECUBITAL SPACE;  Surgeon: Marchia Bond, MD;  Location: [facility];  Service: Orthopedics;  Laterality: Left;     A IV Location/Drains/Wounds Patient Lines/Drains/Airways Status   Active Line/Drains/Airways    Name:    Placement date:   Placement time:   Site:   Days:   Peripheral IV 06/12/19 Left;Anterior Arm   06/12/19    1422    Arm   less than 1   Peripheral IV 06/12/19 Right Wrist   06/12/19    1552    Wrist   less than 1          Intake/Output Last 24 hours No intake or output data in the 24 hours ending 06/12/19 1625  Labs/Imaging Results for orders placed or performed during the hospital encounter of 06/12/19 (from the past 48 hour(s))  Comprehensive metabolic panel     Status: Abnormal   Collection Time: 06/12/19  8:29 AM  Result Value Ref Range   Sodium 136 135 - 145 mmol/L   Potassium 3.7 3.5 - 5.1 mmol/L   Chloride 99 98 - 111 mmol/L   CO2 23 22 - 32 mmol/L   Glucose, Bld 105 (H) 70 - 99 mg/dL   BUN 10 6 - 20 mg/dL   Creatinine, Ser 1.61 (H) 0.61 - 1.24 mg/dL   Calcium 9.7 8.9 - 10.3 mg/dL   Total Protein 8.3 (H) 6.5 - 8.1 g/dL   Albumin 4.1 3.5 - 5.0 g/dL   AST 54 (H) 15 - 41 U/L   ALT 80 (H) 0 - 44 U/L   Alkaline Phosphatase 97 38 - 126 U/L   Total Bilirubin 1.2 0.3 - 1.2 mg/dL   GFR calc non Af Amer 55 (L) >60 mL/min  GFR calc Af Amer >60 >60 mL/min   Anion gap 14 5 - 15    Comment: Performed at [facility], [address] [place], [place] [address]  CBC with Differential     Status: None   Collection Time: 06/12/19  8:29 AM  Result Value Ref Range   WBC 9.5 4.0 - 10.5 K/uL   RBC 4.99 4.22 - 5.81 MIL/uL   Hemoglobin 15.4 13.0 - 17.0 g/dL   HCT 82.946.2 56.239.0 - 13.052.0 %   MCV 92.6 80.0 - 100.0 fL   MCH 30.9 26.0 - 34.0 pg   MCHC 33.3 30.0 - 36.0 g/dL   RDW 86.512.4 78.411.5 - 69.615.5 %   Platelets 371 150 - 400 K/uL   nRBC 0.0 0.0 - 0.2 %   Neutrophils Relative % 68 %   Neutro Abs 6.4 1.7 - 7.7 K/uL   Lymphocytes Relative 24 %   Lymphs Abs 2.3 0.7 - 4.0 K/uL   Monocytes Relative 7 %   Monocytes Absolute 0.7 0.1 - 1.0 K/uL   Eosinophils Relative 1 %   Eosinophils Absolute 0.1 0.0 - 0.5 K/uL   Basophils Relative 0 %   Basophils Absolute 0.0 0.0 - 0.1 K/uL   Immature  Granulocytes 0 %   Abs Immature Granulocytes 0.04 0.00 - 0.07 K/uL    Comment: Performed at [facility], [address] [place], [place] [address]  Ethanol     Status: None   Collection Time: 06/12/19  8:29 AM  Result Value Ref Range   Alcohol, Ethyl (B) <10 <10 mg/dL    Comment: (NOTE) Lowest detectable limit for serum alcohol is 10 mg/dL. For medical purposes only. Performed at [facility], [address] [place], [place] [address]   SARS Coronavirus 2 [facility](Hospital order, Performed in [facility] hospital lab) Nasopharyngeal Nasopharyngeal Swab     Status: Abnormal   Collection Time: 06/12/19  9:00 AM   Specimen: Nasopharyngeal Swab  Result Value Ref Range   SARS Coronavirus 2 POSITIVE (A) NEGATIVE    Comment: RESULT CALLED TO, READ BACK BY AND VERIFIED WITH: Donato SchultzE. Adkins RN 12:25 06/12/19 ([email or web address]) (NOTE) If result is NEGATIVE SARS-CoV-2 target nucleic acids are NOT DETECTED. The SARS-CoV-2 RNA is generally detectable in upper and lower  respiratory specimens during the acute phase of infection. The lowest  concentration of SARS-CoV-2 viral copies this assay can detect is 250  copies / mL. A negative result does not preclude SARS-CoV-2 infection  and should not be used as the sole basis for treatment or other  patient management decisions.  A negative result may occur with  improper specimen collection / handling, submission of specimen other  than nasopharyngeal swab, presence of viral mutation(s) within the  areas targeted by this assay, and inadequate number of viral copies  (<250 copies / mL). A negative result must be combined with clinical  observations, patient history, and epidemiological information. If result is POSITIVE SARS-CoV-2 target nucleic acids are DETECTED.  The SARS-CoV-2 RNA is generally detectable in upper and lower  respiratory specimens during the acute phase of infection.  Positive  results are indicative of active infection with SARS-CoV-2.   Clinical  correlation with patient history and other diagnostic information is  necessary to determine patient infection status.  Positive results do  not rule out bacterial infection or co-infection with other viruses. If result is PRESUMPTIVE POSTIVE SARS-CoV-2 nucleic acids MAY BE PRESENT.   A presumptive positive result was  obtained on the submitted specimen  and confirmed on repeat testing.  While 2019 novel coronavirus  (SARS-CoV-2) nucleic acids may be present in the submitted sample  additional confirmatory testing may be necessary for epidemiological  and / or clinical management purposes  to differentiate between  SARS-CoV-2 and other Sarbecovirus currently known to infect humans.  If clinically indicated additional testing with an alternate test  methodology [phone]) i s advised. The SARS-CoV-2 RNA is generally  detectable in upper and lower respiratory specimens during the acute  phase of infection. The expected result is Negative. Fact Sheet for Patients:  BoilerBrush.com.cy Fact Sheet for Healthcare Providers: https://pope.com/ This test is not yet approved or cleared by the [place] FDA and has been authorized for detection and/or diagnosis of SARS-CoV-2 by FDA under an Emergency Use Authorization (EUA).  This EUA will remain in effect (meaning this test can be used) for the duration of the COVID-19 declaration under Section 564(b)(1) of the Act, 21 U.S.C. section 360bbb-3(b)(1), unless the authorization is terminated or revoked sooner. Performed at [facility], [address], [place], [place] [address]    Dg Chest Port 1 View  Result Date: 06/12/2019 CLINICAL DATA:  Pneumonia EXAM: PORTABLE CHEST 1 VIEW COMPARISON:  05/24/2019 FINDINGS: There is some mild prominent interstitial lung markings bilaterally. This is overall similar to prior study. There is no pneumothorax. No large pleural effusion. The heart  size is stable. There is no acute osseous abnormality. IMPRESSION: Prominent interstitial lung markings bilaterally, similar to prior study. Findings can be seen with an atypical infectious process such as viral pneumonia. Electronically Signed   By: Katherine Mantle M.D.   On: 06/12/2019 14:50    Pending Labs Unresulted Labs (From admission, onward)    Start     Ordered   06/12/19 1423  Lactic acid, plasma  Now then every 2 hours,   STAT     06/12/19 1423   06/12/19 1423  Blood Culture (routine x 2)  BLOOD CULTURE X 2,   STAT     06/12/19 1423   06/12/19 1423  D-dimer, quantitative  ONCE - STAT,   STAT    Comments: Used for prognosis and bed placement. Do not order CT or V/Q.    06/12/19 1423   06/12/19 1423  Procalcitonin  ONCE - STAT,   STAT     06/12/19 1423   06/12/19 1423  Lactate dehydrogenase  Once,   STAT     06/12/19 1423   06/12/19 1423  Ferritin  Once,   STAT     06/12/19 1423   06/12/19 1423  Fibrinogen  Once,   STAT     06/12/19 1423   06/12/19 1423  C-reactive protein  Once,   STAT     06/12/19 1423   06/12/19 1423  Triglycerides  Once,   STAT     06/12/19 1423   06/12/19 0814  Rapid urine drug screen (hospital performed)  ONCE - STAT,   STAT     06/12/19 0814          Vitals/Pain Today's Vitals   06/12/19 1320 06/12/19 1410 06/12/19 1417 06/12/19 1552  BP: 108/77 107/80 107/80 (!) 98/58  Pulse: 86   72  Resp: 12 18  14   Temp:      SpO2: 99% 99%  99%    Isolation Precautions Airborne and Contact precautions  Medications Medications  LORazepam (ATIVAN) injection 0-4 mg ( Intravenous See Alternative 06/12/19 0835)  Or  LORazepam (ATIVAN) tablet 0-4 mg (3 mg Oral Given 06/12/19 0835)  LORazepam (ATIVAN) injection 0-4 mg (has no administration in time range)    Or  LORazepam (ATIVAN) tablet 0-4 mg (has no administration in time range)  thiamine (VITAMIN B-1) tablet 100 mg (100 mg Oral Given 06/12/19 0835)    Or  thiamine (B-1) injection 100 mg (  Intravenous See Alternative 06/12/19 0835)  ibuprofen (ADVIL) tablet 600 mg (600 mg Oral Given 06/12/19 1453)  zolpidem (AMBIEN) tablet 5 mg (has no administration in time range)  ondansetron (ZOFRAN) tablet 4 mg (has no administration in time range)  alum & mag hydroxide-simeth (MAALOX/MYLANTA) 200-200-20 MG/5ML suspension 30 mL (has no administration in time range)  nicotine (NICODERM CQ - dosed in mg/24 hours) patch 21 mg (21 mg Transdermal Patch Applied 06/12/19 1019)  sulfamethoxazole-trimethoprim (BACTRIM DS) 800-160 MG per tablet 1 tablet (1 tablet Oral Given 06/12/19 1018)  dicyclomine (BENTYL) tablet 20 mg (20 mg Oral Given 06/12/19 1101)  hydrOXYzine (ATARAX/VISTARIL) tablet 25 mg (25 mg Oral Given 06/12/19 1101)  loperamide (IMODIUM) capsule 2-4 mg (has no administration in time range)  methocarbamol (ROBAXIN) tablet 500 mg (has no administration in time range)  naproxen (NAPROSYN) tablet 500 mg (500 mg Oral Given 06/12/19 1101)  cloNIDine (CATAPRES) tablet 0.1 mg (0.1 mg Oral Given 06/12/19 1417)    Followed by  cloNIDine (CATAPRES) tablet 0.1 mg (has no administration in time range)    Followed by  cloNIDine (CATAPRES) tablet 0.1 mg (has no administration in time range)  sterile water (preservative free) injection (has no administration in time range)  0.9 %  sodium chloride infusion (has no administration in time range)  ziprasidone (GEODON) injection 20 mg (20 mg Intramuscular Given 06/12/19 1420)  sodium chloride 0.9 % bolus 1,000 mL (1,000 mLs Intravenous New Bag/Given 06/12/19 1425)    Mobility walks     Focused Assessments Pulmonary Assessment Handoff:  Lung sounds:   O2 Device: Room Air        R Recommendations: See Admitting Provider Note  Report given to:   Additional Notes:

## 2019-06-12 NOTE — ED Notes (Signed)
Tearful, states "I just don't want to live"

## 2019-06-12 NOTE — BH Assessment (Addendum)
Assessment Note  John [name] is an 38 y.o. male that presents this date with S/I. Patient does not voice a plan or intent. Patient denies active H/I although reports ongoing AVH. Patient renders limited history and is observed to be impaired at the time of assessment. Patient is very tangential and wanders off topic talking about unrelated events that seemed to have occurred prior to his arrival. Patient does not directly answer questions and is observed to be pacing around his room as this writer attempts to conduct assessment. Patient at times, does not seem to process the content of this writer's questions and keeps asking "what was that." Per notes patient has a history of history of polysubstance abuse including methamphetamine and heroin, drug-induced seizures, hepatitis C and depression. Per history patient has been receiving services at [facility] in 2019 who assisted with medication management although it is unclear if patient is currently seeing that provider at this time. Patient when questioned continues to ask "what is that." Patient appears to be responding to internal stimuli. Patient did report suicidal ideation on admission.  Per notes, patient reported he is going through quite a bit of stress dealing with polysubstance abuse including the regular use of alcoholic along with methamphetamine, heroin, and fentanyl. Last use was last night. States that he has not been able to sleep for the past week. He is having persistent thoughts about harming himself including drug overdose. States that he did attempt to OD several weeks prior but his girlfriend was able to intervene. He did not seek help at that time. He feels his drug use is becoming overwhelming for him. He also endorsed recent breaking up with his girlfriend and currently living in a hotel. He admits to selling drugs to support his habit and to pay for living quarters. He felt he needs detox for his drug use and states that he has gone through  detox in the past with some success. Per notes patient seemed to provide a somewhat accurate history on arrival although at the time of assessment was unable too. It is unclear if patient is suffering from withdrawals at this time. Patient was last seen in 2017 when he presented with similar symptoms at that time. Per that note patient has a history of depression. Patient's thought process was disorganized this date and patient seemed to be responding to internal stimuli. Patient had poor insight with memory and concentration being impaired. Impulse control and judgement were deemed poor as evidenced by his ongoing suicidal ideation and drug use. UDS and BAL pending. Case was staffed with Hall Busing NP who recommended patient be observed and monitored.    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 Asthma   . CVA (cerebral infarction)    drug induced  . Depression   . Drug addiction ([place])   . Hepatitis C   . Seizures ([place])    drug induced    Past Surgical History:  Procedure Laterality Date  . ABDOMINAL SURGERY    . INCISION AND DRAINAGE OF WOUND Left 09/22/2017   Procedure: IRRIGATION AND DEBRIDEMENT WOUND LEFT ELBOW ANTECUBITAL SPACE;  Surgeon: Marchia Bond, MD;  Location: [facility];  Service: Orthopedics;  Laterality: Left;    Family History:  Family History  Problem Relation Age of Onset  . Diabetes Paternal Uncle   . Diabetes Paternal Grandmother   . Depression Mother   . Depression Father   .  Alcoholism Other     Social History:  reports that he has been smoking cigarettes. He has been smoking about 2.00 packs per day. He has never used smokeless tobacco. He reports current alcohol use. He reports current drug use. Drugs: Marijuana, Methamphetamines, and Heroin.  Additional Social History:  Alcohol / Drug Use Pain Medications: See MAR Prescriptions: See MAR Over the Counter:  See MAR History of alcohol / drug use?: Yes Longest period of sobriety (when/how long): UNK Negative Consequences of Use: (Denies) Withdrawal Symptoms: (Denies) Substance #1 Name of Substance 1: Methamphetamine per hx 1 - Age of First Use: UTA 1 - Amount (size/oz): UTA 1 - Frequency: UTA 1 - Duration: UTA 1 - Last Use / Amount: UTA UDS pending Substance #2 Name of Substance 2: Heroin per hx 2 - Age of First Use: UTA 2 - Amount (size/oz): UTA 2 - Frequency: UTA 2 - Duration: UTA 2 - Last Use / Amount: UTA UDS pending  CIWA: CIWA-Ar BP: 107/80 Pulse Rate: (!) 115 Nausea and Vomiting: 3 Tactile Disturbances: mild itching, pins and needles, burning or numbness Tremor: no tremor Auditory Disturbances: mild harshness or ability to frighten Paroxysmal Sweats: no sweat visible Visual Disturbances: (S) very mild sensitivity(does not want lights off-- states he sees shadows in the dark) Anxiety: three Headache, Fullness in Head: very mild Agitation: somewhat more than normal activity Orientation and Clouding of Sensorium: oriented and can do serial additions CIWA-Ar Total: 13 COWS: Clinical Opiate Withdrawal Scale (COWS) Resting Pulse Rate: Pulse Rate 101-120 Sweating: No report of chills or flushing Restlessness: Frequent shifting or extraneous movements of legs/arms Pupil Size: Pupils pinned or normal size for room light Bone or Joint Aches: Mild diffuse discomfort Runny Nose or Tearing: Nose running or tearing GI Upset: Stomach cramps Tremor: No tremor Yawning: Yawning once or twice during assessment Anxiety or Irritability: Patient reports increasing irritability or anxiousness Gooseflesh Skin: Skin is smooth COWS Total Score: 11  Allergies:  Allergies  Allergen Reactions  . Haldol [Haloperidol Lactate] Swelling  . Tylenol [Acetaminophen] Other (See Comments)    unknown  . Risperdal [Risperidone] Anxiety    dyskinesia  dyskinesia     Home Medications: (Not in a  hospital admission)   OB/GYN Status:  No LMP for male patient.  General Assessment Data Location of Assessment: [facility] ED TTS Assessment: In system Is this a Tele or Face-to-Face Assessment?: Tele Assessment(Phone interview) Is this an Initial Assessment or a Re-assessment for this encounter?: Initial Assessment Patient Accompanied by:: N/A Language Other than English: No Living Arrangements: Other (Comment) What gender do you identify as?: Male Marital status: Single Living Arrangements: Non-relatives/Friends Can pt return to current living arrangement?: Yes Admission Status: Voluntary Is patient capable of signing voluntary admission?: Yes Referral Source: Self/Family/Friend Insurance type: Self pay  Medical Screening Exam [facility] Walk-in ONLY) Medical Exam completed: Yes  Crisis Care Plan Living Arrangements: Non-relatives/Friends Legal Guardian: (NA) Name of Psychiatrist: None Name of Therapist: None  Education Status Is patient currently in school?: No Is the patient employed, unemployed or receiving disability?: Unemployed  Risk to self with the past 6 months Suicidal Ideation: Yes-Currently Present Has patient been a risk to self within the past 6 months prior to admission? : No Suicidal Intent: No Has patient had any suicidal intent within the past 6 months prior to admission? : No Is patient at risk for suicide?: Yes Suicidal Plan?: No Has patient had any suicidal plan within the past 6 months prior to admission? :  No Specify Current Suicidal Plan: (NA) Access to Means: No Specify Access to Suicidal Means: (Denies) What has been your use of drugs/alcohol within the last 12 months?: Current use Previous Attempts/Gestures: Yes How many times?: 1(Per hx) Other Self Harm Risks: (Excessive SA use) Triggers for Past Attempts: Unknown Intentional Self Injurious Behavior: None Family Suicide History: No Recent stressful life event(s): Other (Comment)(Recent breakup with  partner) Persecutory voices/beliefs?: No Depression: Yes Depression Symptoms: (Unable to voice symptoms) Substance abuse history and/or treatment for substance abuse?: Yes Suicide prevention information given to non-admitted patients: Not applicable  Risk to Others within the past 6 months Homicidal Ideation: No Does patient have any lifetime risk of violence toward others beyond the six months prior to admission? : No Thoughts of Harm to Others: No Current Homicidal Intent: No Current Homicidal Plan: No Access to Homicidal Means: No Identified Victim: NA History of harm to others?: No Assessment of Violence: None Noted Violent Behavior Description: NA Does patient have access to weapons?: No Criminal Charges Pending?: No Does patient have a court date: No Is patient on probation?: Unknown  Psychosis Hallucinations: Auditory, Visual Delusions: None noted  Mental Status Report Appearance/Hygiene: Bizarre Eye Contact: Unable to Assess Motor Activity: Agitation Speech: Rapid, Pressured Level of Consciousness: Restless, Irritable Mood: Irritable Affect: Angry Anxiety Level: Moderate Thought Processes: Thought Blocking Judgement: Impaired Orientation: Unable to assess Obsessive Compulsive Thoughts/Behaviors: Unable to Assess  Cognitive Functioning Concentration: Unable to Assess Memory: Unable to Assess Is patient IDD: No Insight: Unable to Assess Impulse Control: Unable to Assess Appetite: (UTA) Have you had any weight changes? : (UTA) Sleep: (UTA) Total Hours of Sleep: (UTA) Vegetative Symptoms: None  ADLScreening [facility] Assessment Services) Patient's cognitive ability adequate to safely complete daily activities?: Yes Patient able to express need for assistance with ADLs?: Yes Independently performs ADLs?: Yes (appropriate for developmental age)  Prior Inpatient Therapy Prior Inpatient Therapy: Yes Prior Therapy Dates: 2017 Prior Therapy Facilty/Provider(s):  [facility] Reason for Treatment: MH issues  Prior Outpatient Therapy Prior Outpatient Therapy: Yes Prior Therapy Dates: 2019 Prior Therapy Facilty/Provider(s): [facility] Reason for Treatment: Med mang Does patient have an ACCT team?: No Does patient have Intensive In-House Services?  : No Does patient have [facility] services? : No Does patient have P4CC services?: No  ADL Screening (condition at time of admission) Patient's cognitive ability adequate to safely complete daily activities?: Yes Is the patient deaf or have difficulty hearing?: No Does the patient have difficulty seeing, even when wearing glasses/contacts?: No Does the patient have difficulty concentrating, remembering, or making decisions?: No Patient able to express need for assistance with ADLs?: Yes Does the patient have difficulty dressing or bathing?: No Independently performs ADLs?: Yes (appropriate for developmental age) Does the patient have difficulty walking or climbing stairs?: No Weakness of Legs: None Weakness of Arms/Hands: None  Home Assistive Devices/Equipment Home Assistive Devices/Equipment: None  Therapy Consults (therapy consults require a physician order) PT Evaluation Needed: No OT Evalulation Needed: No SLP Evaluation Needed: No Abuse/Neglect Assessment (Assessment to be complete while patient is alone) Abuse/Neglect Assessment Can Be Completed: Yes Physical Abuse: Denies Verbal Abuse: Denies Sexual Abuse: Denies Exploitation of patient/patient's resources: Denies Self-Neglect: Denies Values / Beliefs Cultural Requests During Hospitalization: None Spiritual Requests During Hospitalization: None Consults Spiritual Care Consult Needed: No Social Work Consult Needed: No Merchant navy officer (For Healthcare) Does Patient Have a Medical Advance Directive?: No Would patient like information on creating a medical advance directive?: No - Patient declined  Disposition: Case was staffed with  Arlana Pouchate NP who recommended patient be observed and monitored.  Disposition Initial Assessment Completed for this Encounter: Yes Disposition of Patient: (Observe and monitor) Patient refused recommended treatment: No Mode of transportation if patient is discharged/movement?: Loreli Slot(UNK)  On Site Evaluation by:   Reviewed with Physician:    Alfredia Fergusonavid L Lakesha Levinson 06/12/2019 2:33 PM

## 2019-06-12 NOTE — ED Notes (Signed)
Patient was Given a Cup of water.

## 2019-06-12 NOTE — H&P (Addendum)
History and Physical:    John [name]   WUJ:[id number] DOB: [name] DOA: 06/12/2019  Referring MD/provider: PA Laveda Normanran PCP: Patient, No Pcp Per   Patient coming from: Home  Chief Complaint: Suicide attempt in the past month with request for detox from polysubstance abuse.  History is entirely per ED documentation as patient is unable to provide me with history as he is just received a large dose of Geodon.  History of Present Illness:   John [name] is an 38 y.o. male with past medical history significant for polysubstance abuse including opioids, methamphetamines, cannabis and alcohol who presented to the ED earlier today requesting detox from his multiple substances as well as help with depression stating he had attempted suicide with heroin overdose several times in the past month.  There is extensive documentation in both the ED note as well as the psychiatry note earlier in the chart.  [facility] are being consulted to admit the patient as patient was incidentally found to be COVID 19 positive.  Patient apparently had no symptoms and so no work-up had been done other than doing the test.  Patient apparently had denied any shortness of breath or cough or fevers or chills.  Unfortunately I am unable to get a history from the patient myself as he is very somnolent after his Geodon dose.  ED Course:  The patient was noted to have O2 saturations of 98% on room air.  He had no respiratory or febrile complaints.  Patient was initially admitted to psychiatry, and was seen by them, however was changed to tried hospitalist due to COVID-19 positivity.  ROS:   ROS   Review of Systems: Patient unable to give me a review of systems  Past Medical History:   Past Medical History:  Diagnosis Date  . Asthma   . CVA (cerebral infarction)    drug induced  . Depression   . Drug addiction (HCC)   . Hepatitis C   . Seizures (HCC)    drug induced    Past Surgical History:   Past  Surgical History:  Procedure Laterality Date  . ABDOMINAL SURGERY    . INCISION AND DRAINAGE OF WOUND Left 09/22/2017   Procedure: IRRIGATION AND DEBRIDEMENT WOUND LEFT ELBOW ANTECUBITAL SPACE;  Surgeon: Teryl LucyLandau, Joshua, MD;  Location: [facility];  Service: Orthopedics;  Laterality: Left;    Social History:   Social History   Socioeconomic History  . Marital status: Single    Spouse name: Not on file  . Number of children: Not on file  . Years of education: Not on file  . Highest education level: Not on file  Occupational History  . Not on file  Social Needs  . Financial resource strain: Not on file  . Food insecurity    Worry: Not on file    Inability: Not on file  . Transportation needs    Medical: Not on file    Non-medical: Not on file  Tobacco Use  . Smoking status: Current Every Day Smoker    Packs/day: 2.00    Types: Cigarettes  . Smokeless tobacco: Never Used  Substance and Sexual Activity  . Alcohol use: Yes    Comment: occ  . Drug use: Yes    Types: Marijuana, Methamphetamines, Heroin    Comment: Opana, heroin, and pain medications  . Sexual activity: Not on file  Lifestyle  . Physical activity    Days per week: Not on file    Minutes per session:  Not on file  . Stress: Not on file  Relationships  . Social Herbalist on phone: Not on file    Gets together: Not on file    Attends religious service: Not on file    Active member of club or organization: Not on file    Attends meetings of clubs or organizations: Not on file    Relationship status: Not on file  . Intimate partner violence    Fear of current or ex partner: Not on file    Emotionally abused: Not on file    Physically abused: Not on file    Forced sexual activity: Not on file  Other Topics Concern  . Not on file  Social History Narrative  . Not on file    Allergies   Haldol [haloperidol lactate], Tylenol [acetaminophen], and Risperdal [risperidone]  Family history:   Family  History  Problem Relation Age of Onset  . Diabetes Paternal Uncle   . Diabetes Paternal Grandmother   . Depression Mother   . Depression Father   . Alcoholism Other     Current Medications:   Prior to Admission medications   Medication Sig Start Date End Date Taking? Authorizing Provider  benztropine (COGENTIN) 1 MG tablet Take 1 tablet (1 mg total) by mouth at bedtime. 03/26/16   Kerrie Buffalo, NP  cephALEXin (KEFLEX) 500 MG capsule Take 1 capsule (500 mg total) by mouth every 8 (eight) hours. 09/25/17   Patrecia Pour, MD  EPINEPHrine 0.3 mg/0.3 mL IJ SOAJ injection Inject 0.3 mLs (0.3 mg total) into the muscle once. 03/22/16   Long, Wonda Olds, MD  gabapentin (NEURONTIN) 400 MG capsule Take 1 capsule (400 mg total) by mouth 4 (four) times daily - after meals and at bedtime. 03/26/16   Kerrie Buffalo, NP  hydrOXYzine (ATARAX/VISTARIL) 25 MG tablet Take 1 tablet (25 mg total) by mouth every 6 (six) hours as needed for anxiety. 03/26/16   Kerrie Buffalo, NP  ibuprofen (ADVIL,MOTRIN) 600 MG tablet Take 1 tablet (600 mg total) by mouth every 6 (six) hours as needed for mild pain or moderate pain. 09/25/17   Patrecia Pour, MD  nicotine polacrilex (NICORETTE) 2 MG gum Take 1 each (2 mg total) by mouth as needed for smoking cessation. 03/26/16   Kerrie Buffalo, NP  oxyCODONE (OXY IR/ROXICODONE) 5 MG immediate release tablet Take 1 tablet (5 mg total) by mouth every 6 (six) hours as needed for severe pain. 09/25/17   Patrecia Pour, MD  QUEtiapine (SEROQUEL) 200 MG tablet Take 1 tablet (200 mg total) by mouth at bedtime. 03/26/16   Kerrie Buffalo, NP  sulfamethoxazole-trimethoprim (BACTRIM DS,SEPTRA DS) 800-160 MG tablet Take 1 tablet by mouth every 12 (twelve) hours. 09/25/17   Patrecia Pour, MD    Physical Exam:   Vitals:   06/12/19 1320 06/12/19 1410 06/12/19 1417 06/12/19 1552  BP: 108/77 107/80 107/80 (!) 98/58  Pulse: 86   72  Resp: 12 18  14   Temp:      SpO2: 99% 99%  99%     Physical Exam:  Blood pressure (!) 98/58, pulse 72, temperature 98.6 F (37 C), resp. rate 14, SpO2 99 %. Gen: Reasonably well nourished patient with multiple superficial ulcerations on face chest arms and legs lying flat in bed in no acute respiratory distress.  O2 saturations 97% on room air. Chest: Moderately good air entry bilaterally with no adventitious sounds.  CV: Distant, regular, no audible murmurs. Abdomen:  NABS, soft, nondistended, nontender. No tenderness to light or deep palpation.  Extremities: No edema.  Skin: Multiple superficial ulcerations as noted above, no evidence of infection or cellulitis in any of the ulcerations. Neuro: GCS is 8 after Geodon.  He apparently had been awake and alert and fully nonfocal prior to getting the Geodon.   Data Review:    Labs: Basic Metabolic Panel: Recent Labs  Lab 06/12/19 0829  NA 136  K 3.7  CL 99  CO2 23  GLUCOSE 105*  BUN 10  CREATININE 1.61*  CALCIUM 9.7   Liver Function Tests: Recent Labs  Lab 06/12/19 0829  AST 54*  ALT 80*  ALKPHOS 97  BILITOT 1.2  PROT 8.3*  ALBUMIN 4.1   No results for input(s): LIPASE, AMYLASE in the last 168 hours. No results for input(s): AMMONIA in the last 168 hours. CBC: Recent Labs  Lab 06/12/19 0829  WBC 9.5  NEUTROABS 6.4  HGB 15.4  HCT 46.2  MCV 92.6  PLT 371   Cardiac Enzymes: No results for input(s): CKTOTAL, CKMB, CKMBINDEX, TROPONINI in the last 168 hours.  BNP (last 3 results) No results for input(s): PROBNP in the last 8760 hours. CBG: No results for input(s): GLUCAP in the last 168 hours.  Urinalysis    Component Value Date/Time   COLORURINE Yellow 08/07/2014 1236   APPEARANCEUR Hazy 08/07/2014 1236   LABSPEC 1.025 08/07/2014 1236   PHURINE 5.0 08/07/2014 1236   GLUCOSEU Negative 08/07/2014 1236   HGBUR Negative 08/07/2014 1236   BILIRUBINUR Negative 08/07/2014 1236   KETONESUR Negative 08/07/2014 1236   PROTEINUR Negative 08/07/2014 1236   NITRITE Negative  08/07/2014 1236   LEUKOCYTESUR Negative 08/07/2014 1236      Radiographic Studies: Dg Chest Port 1 View  Result Date: 06/12/2019 CLINICAL DATA:  Pneumonia EXAM: PORTABLE CHEST 1 VIEW COMPARISON:  05/24/2019 FINDINGS: There is some mild prominent interstitial lung markings bilaterally. This is overall similar to prior study. There is no pneumothorax. No large pleural effusion. The heart size is stable. There is no acute osseous abnormality. IMPRESSION: Prominent interstitial lung markings bilaterally, similar to prior study. Findings can be seen with an atypical infectious process such as viral pneumonia. Electronically Signed   By: Katherine Mantle M.D.   On: 06/12/2019 14:50    EKG: Independently reviewed.  Sinus rhythm at 80.  Normal intervals.  Normal axis.  Normal EKG.   Assessment/Plan:   Principal Problem:   Suicidal intent Active Problems:   Hepatitis C   Schizoaffective disorder, bipolar type (HCC)   PTSD (post-traumatic stress disorder)   Cocaine use disorder, moderate, dependence (HCC)   Cannabis use disorder, moderate, dependence (HCC)   Alcohol use disorder, moderate, dependence (HCC)   Opioid use disorder, moderate, dependence (HCC)   Neuroleptic induced acute dystonia   Depression with suicidal ideation   38 year old man with polysubstance abuse and depression is admitted for detox and treatment of depression with suicidal ideation/attempts.  Is now admitted to medicine instead of psychiatry due to COVID-19 positivity there does not appear to be any signs of active COVID-19 pathology at present.  DEPRESSION WITH SUICIDAL IDEATION Seen by psychiatry earlier today, received Geodon, they will follow and treat. Suicide precautions with one-to-one supervision. Of note, patient apparently has a history of dystonic reactions from neuroleptics on problem list.  COVID 19  No evidence of any active pathology referrable to COVID-19 at present. We will keep patient on  aerosol precautions. Continue to follow vital signs including  fever and watch for symptoms of cough shortness of breath diarrhea or other signs of pathology from SARS-CoV-2.  POLYSUBTANCE USE Patient has been placed on clonidine taper per psychiatry. Follow vital signs and symptoms closely and treat PRN.  ALCOHOL USE  Patient is on CIWA protocol for alcohol withdrawal.  HEPATITIS C No evidence of cirrhosis or decompensated liver disease on examination at present. The treatment for hepatitis can be pursued as an outpatient as warranted.     Other information:   DVT prophylaxis: Lovenox ordered. Code Status: Full code. Family Communication: Attempted to contact patient's mother however was unable to reach her Disposition Plan: Presumably home Consults called: Psychiatry Admission status: Inpatient  The medical decision making is of moderate complexity, therefore this is a level 2 visit.  Horatio Pel Tublu Birdia Jaycox [facility]  If 7PM-7AM, please contact night-coverage [email or web address] Password [facility] 06/12/2019, 4:28 PM

## 2019-06-12 NOTE — ED Notes (Signed)
GV [id number] [phone]; nurse unavailable to take report at this time. Contact info is given and informed that requested [facility] for transport is in placed.

## 2019-06-12 NOTE — ED Provider Notes (Addendum)
[facility] EMERGENCY DEPARTMENT Provider Note   CSN: [id number] Arrival date & time: 06/12/19  0746     History   Chief Complaint Chief Complaint  Patient presents with  . detox    HPI John [name] is a 38 y.o. male.     The history is provided by the patient and medical records. No language interpreter was used.     38 year old male with significant history of polysubstance abuse including meth and heroin, drug-induced seizures, hepatitis C, depression presenting with suicidal ideation.  Patient report he is going through quite a bit of stress dealing with polysubstance abuse including the regular use of alcoholic along with meth, heroin, and fentanyl.  Last use was last night.  States that he has not been able to sleep for the past week.  He having persistent for about harming himself including drug overdose.  States that he did attempt to OD several weeks prior but his girlfriend was able to intervene.  He did not seek help at that time.  He feels his drug use is becoming overwhelming for him.  He also endorsed recent breaking up with his girlfriend and currently living in a hotel.  He admits to selling drugs to support his habit and to pay for living quarters.  He felt he needs detox for his drug use and states that he has gone through detox in the past with some success.  He denies homicidal ideation, auditory or visual estimation.  He endorsed currently taking clindamycin for skin infection from skin picking.  Past Medical History:  Diagnosis Date  . Asthma   . CVA (cerebral infarction)    drug induced  . Depression   . Drug addiction (HCC)   . Hepatitis C   . Seizures (HCC)    drug induced    Patient Active Problem List   Diagnosis Date Noted  . Abscess of left upper extremity 09/18/2017  . Hx of intrinsic asthma 09/18/2017  . Polysubstance abuse (HCC) 09/18/2017  . Depression with suicidal ideation 09/18/2017  . Substance induced mood disorder  (HCC) 09/18/2017  . Neuroleptic induced acute dystonia 03/22/2016  . Schizoaffective disorder, bipolar type (HCC) 03/20/2016  . PTSD (post-traumatic stress disorder) 03/20/2016  . Cocaine use disorder, moderate, dependence (HCC) 03/20/2016  . Stimulant use disorder 03/20/2016  . Cannabis use disorder, moderate, dependence (HCC) 03/20/2016  . Alcohol use disorder, moderate, dependence (HCC) 03/20/2016  . Opioid use disorder, moderate, dependence (HCC) 03/20/2016  . Hepatitis C 10/13/2013    Past Surgical History:  Procedure Laterality Date  . ABDOMINAL SURGERY    . INCISION AND DRAINAGE OF WOUND Left 09/22/2017   Procedure: IRRIGATION AND DEBRIDEMENT WOUND LEFT ELBOW ANTECUBITAL SPACE;  Surgeon: Teryl Lucy, MD;  Location: [facility];  Service: Orthopedics;  Laterality: Left;        Home Medications    Prior to Admission medications   Medication Sig Start Date End Date Taking? Authorizing Provider  benztropine (COGENTIN) 1 MG tablet Take 1 tablet (1 mg total) by mouth at bedtime. Patient not taking: Reported on 09/18/2017 03/26/16   Adonis Brook, NP  cephALEXin (KEFLEX) 500 MG capsule Take 1 capsule (500 mg total) by mouth every 8 (eight) hours. 09/25/17   Tyrone Nine, MD  EPINEPHrine 0.3 mg/0.3 mL IJ SOAJ injection Inject 0.3 mLs (0.3 mg total) into the muscle once. 03/22/16   Long, Arlyss Repress, MD  gabapentin (NEURONTIN) 400 MG capsule Take 1 capsule (400 mg total) by mouth 4 (  four) times daily - after meals and at bedtime. Patient not taking: Reported on 09/18/2017 03/26/16   Kerrie Buffalo, NP  hydrOXYzine (ATARAX/VISTARIL) 25 MG tablet Take 1 tablet (25 mg total) by mouth every 6 (six) hours as needed for anxiety. Patient not taking: Reported on 09/18/2017 03/26/16   Kerrie Buffalo, NP  ibuprofen (ADVIL,MOTRIN) 600 MG tablet Take 1 tablet (600 mg total) by mouth every 6 (six) hours as needed for mild pain or moderate pain. 09/25/17   Patrecia Pour, MD  nicotine polacrilex (NICORETTE) 2 MG  gum Take 1 each (2 mg total) by mouth as needed for smoking cessation. Patient not taking: Reported on 09/18/2017 03/26/16   Kerrie Buffalo, NP  oxyCODONE (OXY IR/ROXICODONE) 5 MG immediate release tablet Take 1 tablet (5 mg total) by mouth every 6 (six) hours as needed for severe pain. 09/25/17   Patrecia Pour, MD  QUEtiapine (SEROQUEL) 200 MG tablet Take 1 tablet (200 mg total) by mouth at bedtime. Patient not taking: Reported on 09/18/2017 03/26/16   Kerrie Buffalo, NP  sulfamethoxazole-trimethoprim (BACTRIM DS,SEPTRA DS) 800-160 MG tablet Take 1 tablet by mouth every 12 (twelve) hours. 09/25/17   Patrecia Pour, MD    Family History Family History  Problem Relation Age of Onset  . Diabetes Paternal Uncle   . Diabetes Paternal Grandmother   . Depression Mother   . Depression Father   . Alcoholism Other     Social History Social History   Tobacco Use  . Smoking status: Current Every Day Smoker    Packs/day: 2.00    Types: Cigarettes  . Smokeless tobacco: Never Used  Substance Use Topics  . Alcohol use: Yes    Comment: occ  . Drug use: Yes    Types: Marijuana, Methamphetamines, Heroin    Comment: Opana, heroin, and pain medications     Allergies   Haldol [haloperidol lactate], Tylenol [acetaminophen], and Risperdal [risperidone]   Review of Systems Review of Systems  All other systems reviewed and are negative.    Physical Exam Updated Vital Signs BP (!) 127/92 (BP Location: Right Arm)   Pulse (!) 115   Temp 98.6 F (37 C)   Resp 20   Physical Exam Vitals signs and nursing note reviewed.  Constitutional:      General: He is not in acute distress.    Appearance: He is well-developed.     Comments: Patient appears disheveled, tearful, but in no acute distress.  HENT:     Head: Atraumatic.  Eyes:     Conjunctiva/sclera: Conjunctivae normal.  Neck:     Musculoskeletal: Neck supple.  Cardiovascular:     Rate and Rhythm: Tachycardia present.     Pulses: Normal  pulses.     Heart sounds: Normal heart sounds.  Pulmonary:     Effort: Pulmonary effort is normal.     Breath sounds: Normal breath sounds.  Abdominal:     Palpations: Abdomen is soft.     Tenderness: There is no abdominal tenderness.  Skin:    Findings: Rash (Multiple shallow ulceration noted throughout body including bilateral arms, legs, the back of his neck, without significant erythema.) present.  Neurological:     Mental Status: He is alert and oriented to person, place, and time.     GCS: GCS eye subscore is 4. GCS verbal subscore is 5. GCS motor subscore is 6.  Psychiatric:        Mood and Affect: Mood is anxious.  Speech: Speech normal.        Behavior: Behavior is withdrawn.        Thought Content: Thought content is not paranoid. Thought content includes suicidal ideation. Thought content does not include homicidal ideation.      ED Treatments / Results  Labs (all labs ordered are listed, but only abnormal results are displayed) Labs Reviewed  SARS CORONAVIRUS 2 (HOSPITAL ORDER, PERFORMED IN [facility]) - Abnormal; Notable for the following components:      Result Value   SARS Coronavirus 2 POSITIVE (*)    All other components within normal limits  COMPREHENSIVE METABOLIC PANEL - Abnormal; Notable for the following components:   Glucose, Bld 105 (*)    Creatinine, Ser 1.61 (*)    Total Protein 8.3 (*)    AST 54 (*)    ALT 80 (*)    GFR calc non Af Amer 55 (*)    All other components within normal limits  CBC WITH DIFFERENTIAL/PLATELET  ETHANOL  RAPID URINE DRUG SCREEN, HOSP PERFORMED    EKG None  Radiology No results found.  Procedures Procedures (including critical care time)  Medications Ordered in ED Medications  LORazepam (ATIVAN) injection 0-4 mg ( Intravenous See Alternative 06/12/19 0835)    Or  LORazepam (ATIVAN) tablet 0-4 mg (3 mg Oral Given 06/12/19 0835)  LORazepam (ATIVAN) injection 0-4 mg (has no administration in  time range)    Or  LORazepam (ATIVAN) tablet 0-4 mg (has no administration in time range)  thiamine (VITAMIN B-1) tablet 100 mg (100 mg Oral Given 06/12/19 0835)    Or  thiamine (B-1) injection 100 mg ( Intravenous See Alternative 06/12/19 0835)  ibuprofen (ADVIL) tablet 600 mg (has no administration in time range)  zolpidem (AMBIEN) tablet 5 mg (has no administration in time range)  ondansetron (ZOFRAN) tablet 4 mg (has no administration in time range)  alum & mag hydroxide-simeth (MAALOX/MYLANTA) 200-200-20 MG/5ML suspension 30 mL (has no administration in time range)  nicotine (NICODERM CQ - dosed in mg/24 hours) patch 21 mg (has no administration in time range)  sulfamethoxazole-trimethoprim (BACTRIM DS) 800-160 MG per tablet 1 tablet (has no administration in time range)     Initial Impression / Assessment and Plan / ED Course  I have reviewed the triage vital signs and the nursing notes.  Pertinent labs & imaging results that were available during my care of the patient were reviewed by me and considered in my medical decision making (see chart for details).        BP (!) 127/92 (BP Location: Right Arm)   Pulse (!) 115   Temp 98.6 F (37 C)   Resp 20    Final Clinical Impressions(s) / ED Diagnoses   Final diagnoses:  Suicidal ideation  Polysubstance (including opioids) dependence, daily use (HCC)  COVID-19 virus detected    ED Discharge Orders    None     8:20 AM Patient with significant history of polysubstance abuse including opiate abuse and alcohol abuse here with suicidal ideation with plan to overdose as well as requesting help with polysubstance abuse detox.  Patient has multiple skin lesion/ulcerations likely secondary to "meth picking".  He is currently taking clindamycin and has been taking it for the past 5 days.  We will continue that medication.  Will place patient on CIWA protocol as well as COWS protocol.  Will consult TTS once patient is medically  cleared.  9:40 AM Labs indicate AKI with a creatinine  of 1.61 which is increased from prior.  Mild transaminitis with AST 54, ALT 80.  This is likely secondary to his underlying drug use and hepatitis C.    1:45 PM COVID-19 test came back positive.  Patient however denies having cold symptoms.  Unfortunately in the setting of AKI, tachycardia, positive COVID-19 and suicidal ideation along with requesting for drug and alcohol detox, will consult medicine for admission.  Patient would likely need to be transferred to [facility].  2:14 PM Appreciate consultation from [facility] Dr. Luberta Robertson who agrees to admit pt for further care.   Hrithik Boschee was evaluated in Emergency Department on 06/12/2019 for the symptoms described in the history of present illness. He was evaluated in the context of the global COVID-19 pandemic, which necessitated consideration that the patient might be at risk for infection with the SARS-CoV-2 virus that causes COVID-19. Institutional protocols and algorithms that pertain to the evaluation of patients at risk for COVID-19 are in a state of rapid change based on information released by regulatory bodies including the CDC and federal and state organizations. These policies and algorithms were followed during the patient's care in the ED.    Fayrene Helper, PA-C 06/12/19 1415    Derwood Kaplan, MD 06/13/19 1434

## 2019-06-12 NOTE — ED Notes (Signed)
Bladder scanner not available.

## 2019-06-12 NOTE — ED Notes (Signed)
ED Provider at bedside. 

## 2019-06-12 NOTE — ED Notes (Signed)
Pt states that he is suicidal, unable to control thoughts, has been awake for 10 days, using meth- is taking clindamycin for open sores/skin infections prescribed at [facility] per pt.  "I feel like the whole world is coming down on me"

## 2019-06-12 NOTE — Progress Notes (Signed)
Received pt from ed.  Awake and alert while transferring to bed with 1 assist very unsteady. Pt also urinated upon admission. Able to answer a few questions appropriately. Reporting 6/10 CP, no sob, no nausea.  Tremors and difficulty concentrating. Pt covered in generalized scabs that has been picking and scratching at. Bil ear scabs, and scabs on back of neck being the largest to note.   As soon as patient was in bed, fell asleep & drowsy.  Scored 11 on CIWA- held Ativan d/t drowsiness.  Can awaken with sternal rub but only alert to self after asking multiple times.  Will pass along to night shift RN.

## 2019-06-13 DIAGNOSIS — F1910 Other psychoactive substance abuse, uncomplicated: Secondary | ICD-10-CM

## 2019-06-13 DIAGNOSIS — F329 Major depressive disorder, single episode, unspecified: Secondary | ICD-10-CM

## 2019-06-13 DIAGNOSIS — U071 COVID-19: Secondary | ICD-10-CM

## 2019-06-13 DIAGNOSIS — R45851 Suicidal ideations: Principal | ICD-10-CM

## 2019-06-13 DIAGNOSIS — B182 Chronic viral hepatitis C: Secondary | ICD-10-CM

## 2019-06-13 LAB — CBC
HCT: 37.8 % — ABNORMAL LOW (ref 39.0–52.0)
Hemoglobin: 12.8 g/dL — ABNORMAL LOW (ref 13.0–17.0)
MCH: 31.4 pg (ref 26.0–34.0)
MCHC: 33.9 g/dL (ref 30.0–36.0)
MCV: 92.9 fL (ref 80.0–100.0)
Platelets: 230 10*3/uL (ref 150–400)
RBC: 4.07 MIL/uL — ABNORMAL LOW (ref 4.22–5.81)
RDW: 12.7 % (ref 11.5–15.5)
WBC: 4.6 10*3/uL (ref 4.0–10.5)
nRBC: 0 % (ref 0.0–0.2)

## 2019-06-13 LAB — COMPREHENSIVE METABOLIC PANEL
ALT: 53 U/L — ABNORMAL HIGH (ref 0–44)
AST: 38 U/L (ref 15–41)
Albumin: 2.9 g/dL — ABNORMAL LOW (ref 3.5–5.0)
Alkaline Phosphatase: 70 U/L (ref 38–126)
Anion gap: 9 (ref 5–15)
BUN: 11 mg/dL (ref 6–20)
CO2: 24 mmol/L (ref 22–32)
Calcium: 8.3 mg/dL — ABNORMAL LOW (ref 8.9–10.3)
Chloride: 103 mmol/L (ref 98–111)
Creatinine, Ser: 1.11 mg/dL (ref 0.61–1.24)
GFR calc Af Amer: >60 mL/min (ref >60)
GFR calc non Af Amer: >60 mL/min (ref >60)
Glucose, Bld: 85 mg/dL (ref 70–99)
Potassium: 3.6 mmol/L (ref 3.5–5.1)
Sodium: 136 mmol/L (ref 135–145)
Total Bilirubin: 0.6 mg/dL (ref 0.3–1.2)
Total Protein: 6.2 g/dL — ABNORMAL LOW (ref 6.5–8.1)

## 2019-06-13 LAB — HIV ANTIBODY (ROUTINE TESTING W REFLEX): HIV Screen 4th Generation wRfx: NONREACTIVE

## 2019-06-13 MED ORDER — FLUOXETINE HCL 10 MG PO CAPS
10.0000 mg | ORAL_CAPSULE | Freq: Every day | ORAL | Status: DC
  Administered 2019-06-13 – 2019-06-18 (×6): 10 mg via ORAL
  Filled 2019-06-13 (×7): qty 1

## 2019-06-13 MED ORDER — ARIPIPRAZOLE 5 MG PO TABS
2.5000 mg | ORAL_TABLET | Freq: Every day | ORAL | Status: DC
  Administered 2019-06-13 – 2019-06-18 (×6): 2.5 mg via ORAL
  Filled 2019-06-13 (×7): qty 1

## 2019-06-13 MED ORDER — LIP MEDEX EX OINT
TOPICAL_OINTMENT | CUTANEOUS | Status: DC | PRN
  Administered 2019-06-13: 19:00:00 via TOPICAL
  Filled 2019-06-13: qty 7

## 2019-06-13 NOTE — Progress Notes (Signed)
0400 and 0600 CIWA was unable to be completed due to pt sleeping.

## 2019-06-13 NOTE — Consult Note (Signed)
Attempted to connect with patient for telepsych consult.  Nurse unavailable currently in isolation.  Telepsych cart unable to be transported to room.  Recommended, reach out to TTS when patient can be assessed.

## 2019-06-13 NOTE — Progress Notes (Signed)
Per Hall Busing, NP, patient is recommended for psychiatric inpatient admission when medically cleared.  Disposition will seek placement needs once patient is medically cleared.    Ardelle Anton, MSW, LCSW Clinical Social Worker II/Disposition [facility]  Phone: [phone] Fax: [phone]

## 2019-06-13 NOTE — Progress Notes (Addendum)
PROGRESS NOTE   Artis FlockRobert Hottle  ZOX:[id number]    DOB: 1980-10-21    DOA: 06/12/2019  PCP: John [name], No Pcp Per   I have briefly reviewed patients previous medical records in [facility].  Chief Complaint  John [name] presents with   Suicidal   meth abuse   Hallucinations    Brief Narrative:  38 year old male with PMH of polysubstance abuse (opioids, methamphetamines, cocaine, cannabis,??  Alcohol), hepatitis C, asthma, documented drug-induced CVA and seizures, schizoaffective disorder, bipolar type, PTSD and depression presented to the ED 9/25 requesting detox from his multiple substance abuse, help with depression and stated that he had attempted suicide with heroin overdose several times in the past month.  John [name] was incidentally found to be COVID 19+/asymptomatic and hence was admitted to the medical service at [facility] with psychiatric consulting.  Placed on suicide precautions and a Recruitment consultantsafety sitter.   Assessment & Plan:   Principal Problem:   Suicidal intent Active Problems:   Hepatitis C   Schizoaffective disorder, bipolar type (HCC)   PTSD (post-traumatic stress disorder)   Cocaine use disorder, moderate, dependence (HCC)   Cannabis use disorder, moderate, dependence (HCC)   Alcohol use disorder, moderate, dependence (HCC)   Opioid use disorder, moderate, dependence (HCC)   Neuroleptic induced acute dystonia   Depression with suicidal ideation  Acute kidney injury  Resolved.   COVID-19 positive/asymptomatic  Continue airborne and contact precautions.  Monitor closely for development of new symptoms or signs.  Polysubstance abuse  As noted above.  John [name] was placed on clonidine taper by psychiatry, continue.  As per discussion with psychiatry, John [name] reported that he does not drink alcohol regularly and they recommended discontinuing Ativan due to risk of dependence.  However John [name] continues to have high CIWA scores up to 14 earlier today, thereby will continue  Ativan protocol and reassess in a.m.  Depression with suicidal ideations  Psychiatry input appreciated.  Discussed in detail with FNP  As per psychiatry recommendation, initiated Prozac 10 mg daily for depression, Abilify 2.5 mg daily for mood and discontinued Ativan.  Psychiatry recommends inpatient admission when medically cleared.  Of note, his COVID 2519 is asymptomatic and if it was not for his behavioral health presentation, he would have been discharged home to self quarantine.  I discussed this with psychiatry.  John [name] currently homeless and hence psychiatry is consulted clinical social work for placement options.  Psychiatry also recommends to continue suicide precautions and Recruitment consultantsafety sitter.  If John [name] threatens to leave AMA, he will need to be involuntarily committed.  I have requested psychiatry to continue to follow and assist daily inpatient management.  As per chart review, John [name] apparently has history of dystonic reactions from neuroleptics.  Hepatitis C  Outpatient follow-up.   DVT prophylaxis: Lovenox Code Status: Full Family Communication: None at bedside Disposition: To be determined pending psychiatric clearance   Consultants:  Psychiatry  Procedures:  None  Antimicrobials:  None   Subjective: John [name] interviewed and examined this morning with 1: 1 sitter at bedside.  John [name] reportedly had been sleeping until then, had received Geodon yesterday in ED.  John [name] arousable, states that he follows with [facility] in [place], asking for his Paxil and clonidine to be resumed.  Also asking when psychiatry was going to see him.  He did not voice SI/HI or AVH to me.  However subsequently he told the RN that he wished he could "shoot himself in the head" or "jump in front of a moving car".  I relayed  these to the consulting psychiatrist.  Objective:  Vitals:   06/12/19 2103 06/12/19 2219 06/13/19 0744 06/13/19 1449  BP: (!) 139/105 113/81 123/77 (!) 144/93    Pulse: (!) 57 70 65 74  Resp: Temp: (!) 97.2 F (36.2 C)  (!) 97.4 F (36.3 C) (!) 97.5 F (36.4 C)  TempSrc: Axillary  Oral Oral  SpO2: 97%  99% 100%    Examination:  General exam: Pleasant young male, moderately built and nourished lying comfortably supine in bed. Respiratory system: Clear to auscultation. Respiratory effort normal. Cardiovascular system: S1 & S2 heard, RRR. No JVD, murmurs, rubs, gallops or clicks. No pedal edema.  Telemetry personally reviewed: Sinus rhythm. Gastrointestinal system: Abdomen is nondistended, soft and nontender. No organomegaly or masses felt. Normal bowel sounds heard. Central nervous system: Alert and oriented. No focal neurological deficits. Extremities: Symmetric 5 x 5 power. Skin: Multiple scars of healing wounds on bridge of nose, right arm and years. Psychiatry: Judgement and insight appear normal. Mood & affect slightly anxious.     Data Reviewed: I have personally reviewed following labs and imaging studies  CBC: Recent Labs  Lab 06/12/19 0829 06/13/19 0519  WBC 9.5 4.6  NEUTROABS 6.4  --   HGB 15.4 12.8*  HCT 46.2 37.8*  MCV 92.6 92.9  PLT 371 230   Basic Metabolic Panel: Recent Labs  Lab 06/12/19 0829 06/13/19 0519  NA 136 136  K 3.7 3.6  CL 99 103  CO2 23 24  GLUCOSE 105* 85  BUN 10 11  CREATININE 1.61* 1.11  CALCIUM 9.7 8.3*   Liver Function Tests: Recent Labs  Lab 06/12/19 0829 06/13/19 0519  AST 54* 38  ALT 80* 53*  ALKPHOS 97 70  BILITOT 1.2 0.6  PROT 8.3* 6.2*  ALBUMIN 4.1 2.9*    Cardiac Enzymes: No results for input(s): CKTOTAL, CKMB, CKMBINDEX, TROPONINI in the last 168 hours.  CBG: Recent Labs  Lab 06/12/19 2040  GLUCAP 81    Recent Results (from the past 240 hour(s))  SARS Coronavirus 2 [facility] order, Performed in [facility] hospital lab) Nasopharyngeal Nasopharyngeal Swab     Status: Abnormal   Collection Time: 06/12/19  9:00 AM   Specimen: Nasopharyngeal Swab   Result Value Ref Range Status   SARS Coronavirus 2 POSITIVE (A) NEGATIVE Final    Comment: RESULT CALLED TO, READ BACK BY AND VERIFIED WITH: Donato Schultz RN 12:25 06/12/19 ([email or web address]) (NOTE) If result is NEGATIVE SARS-CoV-2 target nucleic acids are NOT DETECTED. The SARS-CoV-2 RNA is generally detectable in upper and lower  respiratory specimens during the acute phase of infection. The lowest  concentration of SARS-CoV-2 viral copies this assay can detect is 250  copies / mL. A negative result does not preclude SARS-CoV-2 infection  and should not be used as the sole basis for treatment or other  John [name] management decisions.  A negative result may occur with  improper specimen collection / handling, submission of specimen other  than nasopharyngeal swab, presence of viral mutation(s) within the  areas targeted by this assay, and inadequate number of viral copies  (<250 copies / mL). A negative result must be combined with clinical  observations, John [name] history, and epidemiological information. If result is POSITIVE SARS-CoV-2 target nucleic acids are DETECTED.  The SARS-CoV-2 RNA is generally detectable in upper and lower  respiratory specimens during the acute phase of infection.  Positive  results are indicative of active infection with SARS-CoV-2.  Clinical  correlation  with John [name] history and other diagnostic information is  necessary to determine John [name] infection status.  Positive results do  not rule out bacterial infection or co-infection with other viruses. If result is PRESUMPTIVE POSTIVE SARS-CoV-2 nucleic acids MAY BE PRESENT.   A presumptive positive result was obtained on the submitted specimen  and confirmed on repeat testing.  While 2019 novel coronavirus  (SARS-CoV-2) nucleic acids may be present in the submitted sample  additional confirmatory testing may be necessary for epidemiological  and / or clinical management purposes  to differentiate between  SARS-CoV-2  and other Sarbecovirus currently known to infect humans.  If clinically indicated additional testing with an alternate test  methodology [phone]) i s advised. The SARS-CoV-2 RNA is generally  detectable in upper and lower respiratory specimens during the acute  phase of infection. The expected result is Negative. Fact Sheet for Patients:  BoilerBrush.com.cy Fact Sheet for Healthcare Providers: https://pope.com/ This test is not yet approved or cleared by the [place] FDA and has been authorized for detection and/or diagnosis of SARS-CoV-2 by FDA under an Emergency Use Authorization (EUA).  This EUA will remain in effect (meaning this test can be used) for the duration of the COVID-19 declaration under Section 564(b)(1) of the Act, 21 U.S.C. section 360bbb-3(b)(1), unless the authorization is terminated or revoked sooner. Performed at [facility], [address], [place], [place] [address]   Blood Culture (routine x 2)     Status: None (Preliminary result)   Collection Time: 06/12/19  3:35 PM   Specimen: BLOOD LEFT ARM  Result Value Ref Range Status   Specimen Description BLOOD LEFT ARM  Final   Special Requests   Final    BOTTLES DRAWN AEROBIC AND ANAEROBIC Blood Culture adequate volume   Culture   Final    NO GROWTH < 24 HOURS Performed at [facility], [address] [place], [place] [address]    Report Status PENDING  Incomplete  Blood Culture (routine x 2)     Status: None (Preliminary result)   Collection Time: 06/12/19  3:51 PM   Specimen: BLOOD RIGHT ARM  Result Value Ref Range Status   Specimen Description BLOOD RIGHT ARM  Final   Special Requests   Final    BOTTLES DRAWN AEROBIC AND ANAEROBIC Blood Culture adequate volume   Culture   Final    NO GROWTH < 24 HOURS Performed at [facility], [address] [place], [place], [place] [address]    Report Status PENDING  Incomplete         Radiology  Studies: Dg Chest Port 1 View  Result Date: 06/12/2019 CLINICAL DATA:  Pneumonia EXAM: PORTABLE CHEST 1 VIEW COMPARISON:  05/24/2019 FINDINGS: There is some mild prominent interstitial lung markings bilaterally. This is overall similar to prior study. There is no pneumothorax. No large pleural effusion. The heart size is stable. There is no acute osseous abnormality. IMPRESSION: Prominent interstitial lung markings bilaterally, similar to prior study. Findings can be seen with an atypical infectious process such as viral pneumonia. Electronically Signed   By: Katherine Mantle M.D.   On: 06/12/2019 14:50        Scheduled Meds:  ARIPiprazole  2.5 mg Oral Daily   cloNIDine  0.1 mg Oral QID   Followed by   Melene Muller ON 06/14/2019] cloNIDine  0.1 mg Oral BH-qamhs   Followed by   Melene Muller ON 06/16/2019] cloNIDine  0.1 mg Oral QAC breakfast   enoxaparin (LOVENOX)  injection  40 mg Subcutaneous Q24H   FLUoxetine  10 mg Oral Daily   folic acid  1 mg Oral Daily   LORazepam  0-4 mg Intravenous Q6H   Or   LORazepam  0-4 mg Oral Q6H   [START ON 06/14/2019] LORazepam  0-4 mg Intravenous Q12H   Or   [START ON 06/14/2019] LORazepam  0-4 mg Oral Q12H   multivitamin with minerals  1 tablet Oral Daily   nicotine  21 mg Transdermal Daily   sulfamethoxazole-trimethoprim  1 tablet Oral Q12H   thiamine  100 mg Oral Daily   Or   thiamine  100 mg Intravenous Daily   Continuous Infusions:  sodium chloride 1,000 mL (06/13/19 0943)     LOS: 1 day     Vernell Leep, MD, FACP, [facility]. [facility]  To contact the attending provider between 7A-7P or the covering provider during after hours 7P-7A, please log into the web site [email or web address] and access using universal [place] password for that web site. If you do not have the password, please call the hospital operator.  06/13/2019, 4:58 PM

## 2019-06-13 NOTE — Plan of Care (Signed)
  Problem: Clinical Measurements: Goal: Ability to maintain clinical measurements within normal limits will improve Outcome: Progressing Goal: Will remain free from infection Outcome: Progressing   

## 2019-06-13 NOTE — Consult Note (Addendum)
Telepsych Consultation   Reason for Consult:  SI/Polysubstance Abuse Referring Physician:  Dr. Waymon Amato Location of Patient: 2W35 Location of Provider: [facility]  Patient Identification: John [name] MRN:  [id number] Principal Diagnosis: Suicidal intent Diagnosis:  Principal Problem:   Suicidal intent Active Problems:   Hepatitis C   Schizoaffective disorder, bipolar type (HCC)   PTSD (post-traumatic stress disorder)   Cocaine use disorder, moderate, dependence (HCC)   Cannabis use disorder, moderate, dependence (HCC)   Alcohol use disorder, moderate, dependence (HCC)   Opioid use disorder, moderate, dependence (HCC)   Neuroleptic induced acute dystonia   Depression with suicidal ideation   Total Time spent with patient: 1 hour  Subjective:   John [name] is a 38 y.o. male patient admitted with COVID positive. Patient presents initially with suicidal thoughts without plan or intent. Admitted to inpatient medical with COVID positive. Patient denies active suicidal thoughts, states "I want to crawl in a black hole." Patient denies homicidal ideations. Patient endorses auditory hallucinations, "worse when I use meth." Patient endorses "picking" at skin on face and ears while using methamphetamine, abrasions noted to nose and bilateral ears. Patient seen outpatient at [facility], states, "I take Prozac and Clonidine but the clonidine doesn't work." Patient not taking medication x approx. one month.   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 I have nowhere to go." Patient tearful when describing stressors. Patient verbalizes, 'I just want to get into a 90-day program to get off this stuff."    HPI:  Per TTS assessment: John [name] is an 38 y.o. male that presents this date with S/I. Patient does not voice a plan or intent. Patient denies active H/I although reports  ongoing AVH. Patient renders limited history and is observed to be impaired at the time of assessment.   Past Psychiatric History: MDD, Polysubstance Use disorder, PTSD, Schizoaffective disorder, Substance induced mood disorder  Risk to Self: Suicidal Ideation: Yes-Currently Present Suicidal Intent: No Is patient at risk for suicide?: Yes Suicidal Plan?: No Specify Current Suicidal Plan: (NA) Access to Means: No Specify Access to Suicidal Means: (Denies) What has been your use of drugs/alcohol within the last 12 months?: Current use How many times?: 1(Per hx) Other Self Harm Risks: (Excessive SA use) Triggers for Past Attempts: Unknown Intentional Self Injurious Behavior: None Risk to Others: Homicidal Ideation: No Thoughts of Harm to Others: No Current Homicidal Intent: No Current Homicidal Plan: No Access to Homicidal Means: No Identified Victim: NA History of harm to others?: No Assessment of Violence: None Noted Violent Behavior Description: NA Does patient have access to weapons?: No Criminal Charges Pending?: No Does patient have a court date: No Prior Inpatient Therapy: Prior Inpatient Therapy: Yes Prior Therapy Dates: 2017 Prior Therapy Facilty/Provider(s): [facility] Reason for Treatment: MH issues Prior Outpatient Therapy: Prior Outpatient Therapy: Yes Prior Therapy Dates: 2019 Prior Therapy Facilty/Provider(s): [facility] Reason for Treatment: Med mang Does patient have an ACCT team?: No Does patient have Intensive In-House Services?  : No Does patient have [facility] services? : No Does patient have P4CC services?: No  Past Medical History:  Past Medical History:  Diagnosis Date  . Asthma   . CVA (cerebral infarction)    drug induced  . Depression   . Drug addiction (HCC)   . Hepatitis C   . Seizures (HCC)    drug induced    Past  Surgical History:  Procedure Laterality Date  . ABDOMINAL SURGERY    . INCISION AND DRAINAGE OF WOUND Left 09/22/2017   Procedure:  IRRIGATION AND DEBRIDEMENT WOUND LEFT ELBOW ANTECUBITAL SPACE;  Surgeon: Teryl Lucy, MD;  Location: [facility];  Service: Orthopedics;  Laterality: Left;   Family History:  Family History  Problem Relation Age of Onset  . Diabetes Paternal Uncle   . Diabetes Paternal Grandmother   . Depression Mother   . Depression Father   . Alcoholism Other    Family Psychiatric  History: Maternal uncle- completed suicide Social History:  Social History   Substance and Sexual Activity  Alcohol Use Yes   Comment: occ     Social History   Substance and Sexual Activity  Drug Use Yes  . Types: Marijuana, Methamphetamines, Heroin   Comment: Opana, heroin, and pain medications    Social History   Socioeconomic History  . Marital status: Single    Spouse name: Not on file  . Number of children: Not on file  . Years of education: Not on file  . Highest education level: Not on file  Occupational History  . Not on file  Social Needs  . Financial resource strain: Not on file  . Food insecurity    Worry: Not on file    Inability: Not on file  . Transportation needs    Medical: Not on file    Non-medical: Not on file  Tobacco Use  . Smoking status: Current Every Day Smoker    Packs/day: 2.00    Types: Cigarettes  . Smokeless tobacco: Never Used  Substance and Sexual Activity  . Alcohol use: Yes    Comment: occ  . Drug use: Yes    Types: Marijuana, Methamphetamines, Heroin    Comment: Opana, heroin, and pain medications  . Sexual activity: Not on file  Lifestyle  . Physical activity    Days per week: Not on file    Minutes per session: Not on file  . Stress: Not on file  Relationships  . Social Musician on phone: Not on file    Gets together: Not on file    Attends religious service: Not on file    Active member of club or organization: Not on file    Attends meetings of clubs or organizations: Not on file    Relationship status: Not on file  Other Topics Concern   . Not on file  Social History Narrative  . Not on file   Additional Social History:    Allergies:   Allergies  Allergen Reactions  . Haldol [Haloperidol Lactate] Swelling  . Tylenol [Acetaminophen] Other (See Comments)    unknown  . Risperdal [Risperidone] Anxiety    dyskinesia  dyskinesia     Labs:  Results for orders placed or performed during the hospital encounter of 06/12/19 (from the past 48 hour(s))  Comprehensive metabolic panel     Status: Abnormal   Collection Time: 06/12/19  8:29 AM  Result Value Ref Range   Sodium 136 135 - 145 mmol/L   Potassium 3.7 3.5 - 5.1 mmol/L   Chloride 99 98 - 111 mmol/L   CO2 23 22 - 32 mmol/L   Glucose, Bld 105 (H) 70 - 99 mg/dL   BUN 10 6 - 20 mg/dL   Creatinine, Ser 9.56 (H) 0.61 - 1.24 mg/dL   Calcium 9.7 8.9 - 21.3 mg/dL   Total Protein 8.3 (H) 6.5 - 8.1 g/dL  Albumin 4.1 3.5 - 5.0 g/dL   AST 54 (H) 15 - 41 U/L   ALT 80 (H) 0 - 44 U/L   Alkaline Phosphatase 97 38 - 126 U/L   Total Bilirubin 1.2 0.3 - 1.2 mg/dL   GFR calc non Af Amer 55 (L) >60 mL/min   GFR calc Af Amer >60 >60 mL/min   Anion gap 14 5 - 15    Comment: Performed at [facility], [address], [place], [place] [address]  CBC with Differential     Status: None   Collection Time: 06/12/19  8:29 AM  Result Value Ref Range   WBC 9.5 4.0 - 10.5 K/uL   RBC 4.99 4.22 - 5.81 MIL/uL   Hemoglobin 15.4 13.0 - 17.0 g/dL   HCT 65.7 84.6 - 96.2 %   MCV 92.6 80.0 - 100.0 fL   MCH 30.9 26.0 - 34.0 pg   MCHC 33.3 30.0 - 36.0 g/dL   RDW 95.2 84.1 - 32.4 %   Platelets 371 150 - 400 K/uL   nRBC 0.0 0.0 - 0.2 %   Neutrophils Relative % 68 %   Neutro Abs 6.4 1.7 - 7.7 K/uL   Lymphocytes Relative 24 %   Lymphs Abs 2.3 0.7 - 4.0 K/uL   Monocytes Relative 7 %   Monocytes Absolute 0.7 0.1 - 1.0 K/uL   Eosinophils Relative 1 %   Eosinophils Absolute 0.1 0.0 - 0.5 K/uL   Basophils Relative 0 %   Basophils Absolute 0.0 0.0 - 0.1 K/uL   Immature Granulocytes 0 %    Abs Immature Granulocytes 0.04 0.00 - 0.07 K/uL    Comment: Performed at [facility], [address] [place], [place] [address]  Ethanol     Status: None   Collection Time: 06/12/19  8:29 AM  Result Value Ref Range   Alcohol, Ethyl (B) <10 <10 mg/dL    Comment: (NOTE) Lowest detectable limit for serum alcohol is 10 mg/dL. For medical purposes only. Performed at [facility], [address], [place], [place] [address]   SARS Coronavirus 2 [facility] order, Performed in [facility] hospital lab) Nasopharyngeal Nasopharyngeal Swab     Status: Abnormal   Collection Time: 06/12/19  9:00 AM   Specimen: Nasopharyngeal Swab  Result Value Ref Range   SARS Coronavirus 2 POSITIVE (A) NEGATIVE    Comment: RESULT CALLED TO, READ BACK BY AND VERIFIED WITH: Donato Schultz RN 12:25 06/12/19 ([email or web address]) (NOTE) If result is NEGATIVE SARS-CoV-2 target nucleic acids are NOT DETECTED. The SARS-CoV-2 RNA is generally detectable in upper and lower  respiratory specimens during the acute phase of infection. The lowest  concentration of SARS-CoV-2 viral copies this assay can detect is 250  copies / mL. A negative result does not preclude SARS-CoV-2 infection  and should not be used as the sole basis for treatment or other  patient management decisions.  A negative result may occur with  improper specimen collection / handling, submission of specimen other  than nasopharyngeal swab, presence of viral mutation(s) within the  areas targeted by this assay, and inadequate number of viral copies  (<250 copies / mL). A negative result must be combined with clinical  observations, patient history, and epidemiological information. If result is POSITIVE SARS-CoV-2 target nucleic acids are DETECTED.  The SARS-CoV-2 RNA is generally detectable in upper and lower  respiratory specimens during the acute phase of infection.  Positive  results are indicative of active infection  with SARS-CoV-2.  Clinical   correlation with patient history and other diagnostic information is  necessary to determine patient infection status.  Positive results do  not rule out bacterial infection or co-infection with other viruses. If result is PRESUMPTIVE POSTIVE SARS-CoV-2 nucleic acids MAY BE PRESENT.   A presumptive positive result was obtained on the submitted specimen  and confirmed on repeat testing.  While 2019 novel coronavirus  (SARS-CoV-2) nucleic acids may be present in the submitted sample  additional confirmatory testing may be necessary for epidemiological  and / or clinical management purposes  to differentiate between  SARS-CoV-2 and other Sarbecovirus currently known to infect humans.  If clinically indicated additional testing with an alternate test  methodology [phone]) i s advised. The SARS-CoV-2 RNA is generally  detectable in upper and lower respiratory specimens during the acute  phase of infection. The expected result is Negative. Fact Sheet for Patients:  BoilerBrush.com.cy Fact Sheet for Healthcare Providers: https://pope.com/ This test is not yet approved or cleared by the [place] FDA and has been authorized for detection and/or diagnosis of SARS-CoV-2 by FDA under an Emergency Use Authorization (EUA).  This EUA will remain in effect (meaning this test can be used) for the duration of the COVID-19 declaration under Section 564(b)(1) of the Act, 21 U.S.C. section 360bbb-3(b)(1), unless the authorization is terminated or revoked sooner. Performed at [facility], [address] [place], [place] [address]   D-dimer, quantitative     Status: Abnormal   Collection Time: 06/12/19  2:23 PM  Result Value Ref Range   D-Dimer, Quant 1.90 (H) 0.00 - 0.50 ug/mL-FEU    Comment: (NOTE) At the manufacturer cut-off of 0.50 ug/mL FEU, this assay has been documented to exclude PE with a sensitivity and negative predictive value  of 97 to 99%.  At this time, this assay has not been approved by the FDA to exclude DVT/VTE. Results should be correlated with clinical presentation. Performed at [facility], [address] [place], [place] [address]   Procalcitonin     Status: None   Collection Time: 06/12/19  2:23 PM  Result Value Ref Range   Procalcitonin <0.10 ng/mL    Comment:        Interpretation: PCT (Procalcitonin) <= 0.5 ng/mL: Systemic infection (sepsis) is not likely. Local bacterial infection is possible. (NOTE)       Sepsis PCT Algorithm           Lower Respiratory Tract                                      Infection PCT Algorithm    ----------------------------     ----------------------------         PCT < 0.25 ng/mL                PCT < 0.10 ng/mL         Strongly encourage             Strongly discourage   discontinuation of antibiotics    initiation of antibiotics    ----------------------------     -----------------------------       PCT 0.25 - 0.50 ng/mL            PCT 0.10 - 0.25 ng/mL               OR       >80%  decrease in PCT            Discourage initiation of                                            antibiotics      Encourage discontinuation           of antibiotics    ----------------------------     -----------------------------         PCT >= 0.50 ng/mL              PCT 0.26 - 0.50 ng/mL               AND        <80% decrease in PCT             Encourage initiation of                                             antibiotics       Encourage continuation           of antibiotics    ----------------------------     -----------------------------        PCT >= 0.50 ng/mL                  PCT > 0.50 ng/mL               AND         increase in PCT                  Strongly encourage                                      initiation of antibiotics    Strongly encourage escalation           of antibiotics                                     -----------------------------                                            PCT <= 0.25 ng/mL                                                 OR                                        > 80% decrease in PCT                                     Discontinue / Do not initiate  antibiotics Performed at [facility], [place] [address], [place], [place] [address]   Lactate dehydrogenase     Status: None   Collection Time: 06/12/19  2:23 PM  Result Value Ref Range   LDH 179 98 - 192 U/L    Comment: Performed at [facility], [place] [address], [place], [place] [address]  Ferritin     Status: None   Collection Time: 06/12/19  2:23 PM  Result Value Ref Range   Ferritin 87 24 - 336 ng/mL    Comment: Performed at [facility] [address] [place], [place] [address]  Fibrinogen     Status: None   Collection Time: 06/12/19  2:23 PM  Result Value Ref Range   Fibrinogen 426 210 - 475 mg/dL    Comment: Performed at [facility] [address], [place], [place] [address]  C-reactive protein     Status: Abnormal   Collection Time: 06/12/19  2:23 PM  Result Value Ref Range   CRP 1.1 (H) <1.0 mg/dL    Comment: Performed at [facility], [place] [address], [place], [place] [address]  Triglycerides     Status: None   Collection Time: 06/12/19  2:23 PM  Result Value Ref Range   Triglycerides 75 <150 mg/dL    Comment: Performed at [facility], [place] [address], [place], [place] [address]  Blood Culture (routine x 2)     Status: None (Preliminary result)   Collection Time: 06/12/19  3:35 PM   Specimen: BLOOD LEFT ARM  Result Value Ref Range   Specimen Description BLOOD LEFT ARM    Special Requests      BOTTLES DRAWN AEROBIC AND ANAEROBIC Blood Culture adequate volume   Culture      NO GROWTH < 24 HOURS Performed at [facility], [place] [address] [place], [place] [address]    Report Status PENDING   Blood Culture (routine x 2)     Status: None (Preliminary  result)   Collection Time: 06/12/19  3:51 PM   Specimen: BLOOD RIGHT ARM  Result Value Ref Range   Specimen Description BLOOD RIGHT ARM    Special Requests      BOTTLES DRAWN AEROBIC AND ANAEROBIC Blood Culture adequate volume   Culture      NO GROWTH < 24 HOURS Performed at [facility], [place] [address] [place], [place] [address]    Report Status PENDING   Lactic acid, plasma     Status: None   Collection Time: 06/12/19  4:00 PM  Result Value Ref Range   Lactic Acid, Venous 0.8 0.5 - 1.9 mmol/L    Comment: Performed at [place] [facility] [address] [place], [place] [address]  Lactic acid, plasma     Status: None   Collection Time: 06/12/19  7:13 PM  Result Value Ref Range   Lactic Acid, Venous 1.5 0.5 - 1.9 mmol/L    Comment: Performed at [place] [address], [place],  [address]  HIV Antibody  (Routine Testing)     Status: None   Collection Time: 06/12/19  7:13 PM  Result Value Ref Range   HIV Screen 4th Generation wRfx Non Reactive Non Reactive    Comment: (NOTE) Performed At: [facility] [address] [place], [place] [id number] Rush Farmer MD KX:[id number]   Glucose, capillary     Status: None   Collection Time: 06/12/19  8:40 PM  Result Value Ref Range   Glucose-Capillary 81 70 - 99 mg/dL  Comprehensive metabolic panel     Status: Abnormal   Collection Time: 06/13/19  5:19 AM  Result Value Ref Range   Sodium 136 135 - 145 mmol/L   Potassium 3.6 3.5 - 5.1 mmol/L   Chloride 103 98 - 111 mmol/L   CO2 24 22 - 32 mmol/L   Glucose, Bld 85 70 - 99 mg/dL   BUN 11 6 - 20 mg/dL   Creatinine, Ser 7.85 0.61 - 1.24 mg/dL   Calcium 8.3 (L) 8.9 - 10.3 mg/dL   Total Protein 6.2 (L) 6.5 - 8.1 g/dL   Albumin 2.9 (L) 3.5 - 5.0 g/dL   AST 38 15 - 41 U/L   ALT 53 (H) 0 - 44 U/L   Alkaline Phosphatase 70 38 - 126 U/L   Total Bilirubin 0.6 0.3 - 1.2 mg/dL   GFR calc non Af Amer >60 >60 mL/min   GFR calc Af Amer >60 >60 mL/min   Anion  gap 9 5 - 15    Comment: Performed at [facility], [address] [place], [place] [address]  CBC     Status: Abnormal   Collection Time: 06/13/19  5:19 AM  Result Value Ref Range   WBC 4.6 4.0 - 10.5 K/uL   RBC 4.07 (L) 4.22 - 5.81 MIL/uL   Hemoglobin 12.8 (L) 13.0 - 17.0 g/dL   HCT 77.4 (L) 12.8 - 78.6 %   MCV 92.9 80.0 - 100.0 fL   MCH 31.4 26.0 - 34.0 pg   MCHC 33.9 30.0 - 36.0 g/dL   RDW 76.7 20.9 - 47.0 %   Platelets 230 150 - 400 K/uL   nRBC 0.0 0.0 - 0.2 %    Comment: Performed at [facility], [address] [place], [place] [address]    Medications:  Current Facility-Administered Medications  Medication Dose Route Frequency Provider Last Rate Last Dose  . 0.9 %  sodium chloride infusion  1,000 mL Intravenous Continuous Pieter Partridge, MD 125 mL/hr at 06/13/19 0943 1,000 mL at 06/13/19 0943  . alum & mag hydroxide-simeth (MAALOX/MYLANTA) 200-200-20 MG/5ML suspension 30 mL  30 mL Oral Q6H PRN Leandro Reasoner Tublu, MD      . cloNIDine (CATAPRES) tablet 0.1 mg  0.1 mg Oral QID Leandro Reasoner Tublu, MD   0.1 mg at 06/13/19 1023   Followed by  . [START ON 06/14/2019] cloNIDine (CATAPRES) tablet 0.1 mg  0.1 mg Oral BH-qamhs Pieter Partridge, MD       Followed by  . [START ON 06/16/2019] cloNIDine (CATAPRES) tablet 0.1 mg  0.1 mg Oral QAC breakfast Leandro Reasoner Tublu, MD      . dicyclomine (BENTYL) tablet 20 mg  20 mg Oral Q6H PRN Leandro Reasoner Tublu, MD   20 mg at 06/12/19 1101  . enoxaparin (LOVENOX) injection 40 mg  40 mg Subcutaneous Q24H Leandro Reasoner Tublu, MD      . folic acid (FOLVITE) tablet 1 mg  1 mg Oral Daily Leandro Reasoner Tublu, MD   1 mg at 06/13/19 1022  . hydrOXYzine (ATARAX/VISTARIL) tablet 25 mg  25 mg Oral Q6H PRN Leandro Reasoner Tublu, MD   25 mg at 06/12/19 2212  . ibuprofen (ADVIL) tablet 600 mg  600 mg Oral Q8H PRN Leandro Reasoner Tublu, MD   600 mg at 06/12/19 1453  . loperamide  (IMODIUM) capsule 2-4 mg  2-4  mg Oral PRN Leandro Reasonerhatterjee, Srobona Tublu, MD      . LORazepam (ATIVAN) injection 0-4 mg  0-4 mg Intravenous Q6H Leandro Reasonerhatterjee, Srobona Tublu, MD   2 mg at 06/13/19 1026   Or  . LORazepam (ATIVAN) tablet 0-4 mg  0-4 mg Oral Q6H Pieter Partridgehatterjee, Srobona Tublu, MD   3 mg at 06/12/19 0835  . [START ON 06/14/2019] LORazepam (ATIVAN) injection 0-4 mg  0-4 mg Intravenous Q12H Leandro Reasonerhatterjee, Srobona Tublu, MD       Or  . Melene Muller[START ON 06/14/2019] LORazepam (ATIVAN) tablet 0-4 mg  0-4 mg Oral Q12H Leandro Reasonerhatterjee, Srobona Tublu, MD      . methocarbamol (ROBAXIN) tablet 500 mg  500 mg Oral Q8H PRN Leandro Reasonerhatterjee, Srobona Tublu, MD      . multivitamin with minerals tablet 1 tablet  1 tablet Oral Daily Leandro Reasonerhatterjee, Srobona Tublu, MD   1 tablet at 06/13/19 1022  . naproxen (NAPROSYN) tablet 500 mg  500 mg Oral BID PRN Pieter Partridgehatterjee, Srobona Tublu, MD   500 mg at 06/13/19 1021  . nicotine (NICODERM CQ - dosed in mg/24 hours) patch 21 mg  21 mg Transdermal Daily Leandro Reasonerhatterjee, Srobona Tublu, MD   21 mg at 06/13/19 1026  . ondansetron (ZOFRAN) tablet 4 mg  4 mg Oral Q8H PRN Leandro Reasonerhatterjee, Srobona Tublu, MD   4 mg at 06/13/19 1022  . sulfamethoxazole-trimethoprim (BACTRIM DS) 800-160 MG per tablet 1 tablet  1 tablet Oral Q12H Pieter Partridgehatterjee, Srobona Tublu, MD   1 tablet at 06/13/19 1025  . thiamine (VITAMIN B-1) tablet 100 mg  100 mg Oral Daily Leandro Reasonerhatterjee, Srobona Tublu, MD   100 mg at 06/13/19 1042   Or  . thiamine (B-1) injection 100 mg  100 mg Intravenous Daily Leandro Reasonerhatterjee, Srobona Tublu, MD      . zolpidem (AMBIEN) tablet 5 mg  5 mg Oral QHS PRN Pieter Partridgehatterjee, Srobona Tublu, MD        Musculoskeletal: Strength & Muscle Tone: within normal limits Gait & Station: unable to assess Patient leans: unable to assess  Psychiatric Specialty Exam: Physical Exam  ROS  Blood pressure 123/77, pulse 65, temperature (!) 97.4 F (36.3 C), temperature source Oral, resp. rate 20, SpO2 99 %.There is no height or weight on file to  calculate BMI.  General Appearance: Casual  Eye Contact:  Good  Speech:  Clear and Coherent  Volume:  Normal  Mood:  Anxious  Affect:  Tearful  Thought Process:  Coherent and Descriptions of Associations: Intact  Orientation:  Full (Time, Place, and Person)  Thought Content:  Logical  Suicidal Thoughts:  No  Homicidal Thoughts:  No  Memory:  Immediate;   Fair Recent;   Fair Remote;   Fair  Judgement:  Fair  Insight:  Fair  Psychomotor Activity:  Normal  Concentration:  Concentration: Fair and Attention Span: Fair  Recall:  Good  Fund of Knowledge:  Fair  Language:  Fair  Akathisia:  No  Handed:  Right  AIMS (if indicated):     Assets:  Communication Skills Desire for Improvement  ADL's:  Intact  Cognition:  WNL  Sleep:        Treatment Plan Summary: Daily contact with patient to assess and evaluate symptoms and progress in treatment and Medication management  Consider Prozac 10mg  daily depression, Abilify 2.5mg  daily mood. Consider social work consult as patient is currently homeless and COVID positive.   Consider avoidance ofpotentially addictive medications and stimulants as patient has a documented history of use disorder.  Disposition: Recommend psychiatric Inpatient admission when medically cleared. Supportive therapy provided about ongoing stressors.  This service was provided via telemedicine using a 2-way, interactive audio and video technology.  Names of all persons participating in this telemedicine service and their role in this encounter. Name: John [name] Role: Patient  Name: Berneice Heinrich Role: FNP    Patrcia Dolly, FNP 06/13/2019 2:31 PM   Attest to NP note

## 2019-06-14 DIAGNOSIS — U071 COVID-19: Secondary | ICD-10-CM | POA: Diagnosis not present

## 2019-06-14 NOTE — Progress Notes (Addendum)
CSW acknowledges consult for homelessness and substance abuse.   The patient is currently COVID positive. CSW contacted [facility] at [phone] and left a voicemail to coordinate housing for the patient.   CSW also has provided a packet of substance abuse and housing resources to the RN so that she can provide to the patient.   CSW will continue to follow and offer support.   Domenic Schwab, MSW, [place] Worker [facility]  [phone]

## 2019-06-14 NOTE — Progress Notes (Signed)
Patient sleeping comfortably between dose of Ativan, upon awaking is angry and irritable, no tremors, HR<80, no sweating, no overt signs of withdrawal.Continued on 1:1, no verbalization 60f  Intent to hurt self or others.

## 2019-06-14 NOTE — Consult Note (Addendum)
Telepsych Consultation   Reason for Consult:  SI/Polysubstance Abuse Referring Physician:  Dr. Algis Liming Location of Patient: 2W35 Location of Provider: [facility]  Patient Identification: John [name] MRN:  [id number] Principal Diagnosis: Suicidal intent Diagnosis:  Principal Problem:   Suicidal intent Active Problems:   Hepatitis C   Schizoaffective disorder, bipolar type ([place])   PTSD (post-traumatic stress disorder)   Cocaine use disorder, moderate, dependence (HCC)   Cannabis use disorder, moderate, dependence (HCC)   Alcohol use disorder, moderate, dependence (HCC)   Opioid use disorder, moderate, dependence ([place])   Neuroleptic induced acute dystonia   Depression with suicidal ideation   Total Time spent with patient: 1 hour  Subjective:   John [name] is a 38 y.o. male patient admitted with COVID positive. Patient denies suicidal and homicidal ideations. Patient denies auditory and visual hallucinations. Patient focused on "I really need to get into a 90-day-detox program, I have nowhere to go." Patient verbalizes "I don't want to stop using 2 grams of heroin a day cold [place], can I have Ativan or Subutex or something?" Patient states "I just don't feel like they are trying to help me detox here." Patient alert and oriented for assessment, answers appropriately. On yesterday during assessment patient denies alcohol use, today patient states "I use a gallon of vodka daily, I last drank the day before I came in here."   Patient has completed substance treatment programs in the past, "[facility] and [facility] worked."      HPI:  Per TTS assessment: John [name] is an 38 y.o. male that presents this date with S/I. Patient does not voice a plan or intent. Patient denies active H/I although reports ongoing AVH. Patient renders limited history and is observed to be impaired at the time of assessment.   Past Psychiatric History: MDD, Polysubstance Use disorder, PTSD,  Schizoaffective disorder, Substance induced mood disorder  Risk to Self: No Risk to Others: Homicidal Ideation: No Thoughts of Harm to Others: No Current Homicidal Intent: No Current Homicidal Plan: No Access to Homicidal Means: No Identified Victim: NA History of harm to others?: No Assessment of Violence: None Noted Violent Behavior Description: NA Does patient have access to weapons?: No Criminal Charges Pending?: No Does patient have a court date: No Prior Inpatient Therapy: Prior Inpatient Therapy: Yes Prior Therapy Dates: 2017 Prior Therapy Facilty[facility]Provider[facility]): [facility] Reason for Treatment: MH issues Prior Outpatient Therapy: Prior Outpatient Therapy: Yes Prior Therapy Dates: 2019 Prior Therapy Facilty/Provider(s): [facility] Reason for Treatment: Med mang Does patient have an ACCT team?: No Does patient have Intensive In-House Services?  : No Does patient have [facility] services? : No Does patient have P4CC services?: No  Past Medical History:  Past Medical History:  Diagnosis Date  . Asthma   . CVA (cerebral infarction)    drug induced  . Depression   . Drug addiction ([place])   . Hepatitis C   . 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 ABDOMINAL SURGERY    . INCISION AND DRAINAGE OF WOUND Left 09/22/2017   Procedure: IRRIGATION AND DEBRIDEMENT WOUND LEFT ELBOW ANTECUBITAL SPACE;  Surgeon: Marchia Bond, MD;  Location: [facility];  Service: Orthopedics;  Laterality: Left;   Family History:  Family History  Problem Relation Age of Onset  . Diabetes Paternal Uncle   . Diabetes Paternal Grandmother   . Depression Mother   . Depression Father   . Alcoholism Other  Family Psychiatric  History: Maternal uncle- completed suicide Social History:  Social History   Substance and Sexual Activity  Alcohol Use Yes   Comment: occ     Social History   Substance and Sexual Activity  Drug Use Yes  . Types: Marijuana,  Methamphetamines, Heroin   Comment: Opana, heroin, and pain medications    Social History   Socioeconomic History  . Marital status: Single    Spouse name: Not on file  . Number of children: Not on file  . Years of education: Not on file  . Highest education level: Not on file  Occupational History  . Not on file  Social Needs  . Financial resource strain: Not on file  . Food insecurity    Worry: Not on file    Inability: Not on file  . Transportation needs    Medical: Not on file    Non-medical: Not on file  Tobacco Use  . Smoking status: Current Every Day Smoker    Packs/day: 2.00    Types: Cigarettes  . Smokeless tobacco: Never Used  Substance and Sexual Activity  . Alcohol use: Yes    Comment: occ  . Drug use: Yes    Types: Marijuana, Methamphetamines, Heroin    Comment: Opana, heroin, and pain medications  . Sexual activity: Not on file  Lifestyle  . Physical activity    Days per week: Not on file    Minutes per session: Not on file  . Stress: Not on file  Relationships  . Social Musician on phone: Not on file    Gets together: Not on file    Attends religious service: Not on file    Active member of club or organization: Not on file    Attends meetings of clubs or organizations: Not on file    Relationship status: Not on file  Other Topics Concern  . Not on file  Social History Narrative  . Not on file   Additional Social History:    Allergies:   Allergies  Allergen Reactions  . Haldol [Haloperidol Lactate] Anaphylaxis and Swelling  . Tylenol [Acetaminophen] Other (See Comments)    Patient chooses to limit his intake of this  . Risperdal [Risperidone] Anxiety and Other (See Comments)    Dyskinesia, also      Labs:  Results for orders placed or performed during the hospital encounter of 06/12/19 (from the past 48 hour(s))  D-dimer, quantitative     Status: Abnormal   Collection Time: 06/12/19  2:23 PM  Result Value Ref Range    D-Dimer, Quant 1.90 (H) 0.00 - 0.50 ug/mL-FEU    Comment: (NOTE) At the manufacturer cut-off of 0.50 ug/mL FEU, this assay has been documented to exclude PE with a sensitivity and negative predictive value of 97 to 99%.  At this time, this assay has not been approved by the FDA to exclude DVT/VTE. Results should be correlated with clinical presentation. Performed at [facility], [address], [place], [place] [address]   Procalcitonin     Status: None   Collection Time: 06/12/19  2:23 PM  Result Value Ref Range   Procalcitonin <0.10 ng/mL    Comment:        Interpretation: PCT (Procalcitonin) <= 0.5 ng/mL: Systemic infection (sepsis) is not likely. Local bacterial infection is possible. (NOTE)       Sepsis PCT Algorithm           Lower Respiratory Tract  Infection PCT Algorithm    ----------------------------     ----------------------------         PCT < 0.25 ng/mL                PCT < 0.10 ng/mL         Strongly encourage             Strongly discourage   discontinuation of antibiotics    initiation of antibiotics    ----------------------------     -----------------------------       PCT 0.25 - 0.50 ng/mL            PCT 0.10 - 0.25 ng/mL               OR       >80% decrease in PCT            Discourage initiation of                                            antibiotics      Encourage discontinuation           of antibiotics    ----------------------------     -----------------------------         PCT >= 0.50 ng/mL              PCT 0.26 - 0.50 ng/mL               AND        <80% decrease in PCT             Encourage initiation of                                             antibiotics       Encourage continuation           of antibiotics    ----------------------------     -----------------------------        PCT >= 0.50 ng/mL                  PCT > 0.50 ng/mL               AND         increase in PCT                   Strongly encourage                                      initiation of antibiotics    Strongly encourage escalation           of antibiotics                                     -----------------------------                                           PCT <= 0.25 ng/mL  OR                                        > 80% decrease in PCT                                     Discontinue / Do not initiate                                             antibiotics Performed at [facility], [address], [place], [place] [address]   Lactate dehydrogenase     Status: None   Collection Time: 06/12/19  2:23 PM  Result Value Ref Range   LDH 179 98 - 192 U/L    Comment: Performed at [facility], [address] [place], [place] [address]  Ferritin     Status: None   Collection Time: 06/12/19  2:23 PM  Result Value Ref Range   Ferritin 87 24 - 336 ng/mL    Comment: Performed at [facility], [address] [place], [place] [address]  Fibrinogen     Status: None   Collection Time: 06/12/19  2:23 PM  Result Value Ref Range   Fibrinogen 426 210 - 475 mg/dL    Comment: Performed at [facility], [address] [place], [place] [address]  C-reactive protein     Status: Abnormal   Collection Time: 06/12/19  2:23 PM  Result Value Ref Range   CRP 1.1 (H) <1.0 mg/dL    Comment: Performed at [facility], [address], [place], [place] [address]  Triglycerides     Status: None   Collection Time: 06/12/19  2:23 PM  Result Value Ref Range   Triglycerides 75 <150 mg/dL    Comment: Performed at [facility], [address], [place], [place] [address]  Blood Culture (routine x 2)     Status: None (Preliminary result)   Collection Time: 06/12/19  3:35 PM   Specimen: BLOOD LEFT ARM  Result Value Ref Range   Specimen Description BLOOD LEFT ARM    Special Requests      BOTTLES DRAWN AEROBIC AND ANAEROBIC Blood Culture  adequate volume   Culture      NO GROWTH 2 DAYS Performed at [facility], [address], [place], [place] [address]    Report Status PENDING   Blood Culture (routine x 2)     Status: None (Preliminary result)   Collection Time: 06/12/19  3:51 PM   Specimen: BLOOD RIGHT ARM  Result Value Ref Range   Specimen Description BLOOD RIGHT ARM    Special Requests      BOTTLES DRAWN AEROBIC AND ANAEROBIC Blood Culture adequate volume   Culture      NO GROWTH 2 DAYS Performed at [facility], [address] [place], [place] [address]    Report Status PENDING   Lactic acid, plasma     Status: None   Collection Time: 06/12/19  4:00 PM  Result Value Ref Range   Lactic Acid, Venous 0.8 0.5 - 1.9 mmol/L    Comment: Performed at [facility],  [address], [place], [place] [address]  Lactic acid, plasma     Status: None   Collection Time: 06/12/19  7:13 PM  Result Value Ref Range   Lactic Acid, Venous 1.5 0.5 - 1.9 mmol/L    Comment: Performed at [facility], [address], [place], [place] [address]  HIV Antibody  (Routine Testing)     Status: None   Collection Time: 06/12/19  7:13 PM  Result Value Ref Range   HIV Screen 4th Generation wRfx Non Reactive Non Reactive    Comment: (NOTE) Performed At: [facility] [address] [place], [place] [id number] Jolene Schimke MD JO:[id number]   Glucose, capillary     Status: None   Collection Time: 06/12/19  8:40 PM  Result Value Ref Range   Glucose-Capillary 81 70 - 99 mg/dL  Comprehensive metabolic panel     Status: Abnormal   Collection Time: 06/13/19  5:19 AM  Result Value Ref Range   Sodium 136 135 - 145 mmol/L   Potassium 3.6 3.5 - 5.1 mmol/L   Chloride 103 98 - 111 mmol/L   CO2 24 22 - 32 mmol/L   Glucose, Bld 85 70 - 99 mg/dL   BUN 11 6 - 20 mg/dL   Creatinine, Ser 6.01 0.61 - 1.24 mg/dL   Calcium 8.3 (L) 8.9 - 10.3 mg/dL   Total Protein 6.2 (L) 6.5 - 8.1 g/dL   Albumin 2.9 (L) 3.5 - 5.0  g/dL   AST 38 15 - 41 U/L   ALT 53 (H) 0 - 44 U/L   Alkaline Phosphatase 70 38 - 126 U/L   Total Bilirubin 0.6 0.3 - 1.2 mg/dL   GFR calc non Af Amer >60 >60 mL/min   GFR calc Af Amer >60 >60 mL/min   Anion gap 9 5 - 15    Comment: Performed at [facility], [address], [place], [place] [address]  CBC     Status: Abnormal   Collection Time: 06/13/19  5:19 AM  Result Value Ref Range   WBC 4.6 4.0 - 10.5 K/uL   RBC 4.07 (L) 4.22 - 5.81 MIL/uL   Hemoglobin 12.8 (L) 13.0 - 17.0 g/dL   HCT 55.7 (L) 32.2 - 02.5 %   MCV 92.9 80.0 - 100.0 fL   MCH 31.4 26.0 - 34.0 pg   MCHC 33.9 30.0 - 36.0 g/dL   RDW 42.7 06.2 - 37.6 %   Platelets 230 150 - 400 K/uL   nRBC 0.0 0.0 - 0.2 %    Comment: Performed at [facility], [address] [place], [place] [address]    Medications:  Current Facility-Administered Medications  Medication Dose Route Frequency Provider Last Rate Last Dose  . alum & mag hydroxide-simeth (MAALOX/MYLANTA) 200-200-20 MG/5ML suspension 30 mL  30 mL Oral Q6H PRN Leandro Reasoner Tublu, MD      . ARIPiprazole (ABILIFY) tablet 2.5 mg  2.5 mg Oral Daily Elease Etienne, MD   2.5 mg at 06/14/19 1761  . cloNIDine (CATAPRES) tablet 0.1 mg  0.1 mg Oral BH-qamhs Pieter Partridge, MD       Followed by  . [START ON 06/16/2019] cloNIDine (CATAPRES) tablet 0.1 mg  0.1 mg Oral QAC breakfast Leandro Reasoner Tublu, MD      . dicyclomine (BENTYL) tablet 20 mg  20 mg Oral Q6H PRN Leandro Reasoner Tublu, MD   20 mg at 06/12/19 1101  . enoxaparin (LOVENOX) injection 40 mg  40 mg Subcutaneous Q24H Leandro Reasoner Tublu, MD   40 mg at 06/13/19 1845  . FLUoxetine (PROZAC) capsule 10 mg  10 mg Oral Daily Elease Etienne, MD   10 mg at 06/13/19 1843  . folic acid (FOLVITE) tablet 1 mg  1 mg Oral Daily Leandro Reasoner Tublu, MD   1 mg at 06/14/19 0924  . hydrOXYzine (ATARAX/VISTARIL) tablet 25 mg  25 mg Oral Q6H PRN Pieter Partridge, MD   25 mg at  06/12/19 2212  . lip balm (CARMEX) ointment   Topical PRN Hongalgi, Anand D, MD      . loperamide (IMODIUM) capsule 2-4 mg  2-4 mg Oral PRN Leandro Reasoner Tublu, MD      . LORazepam (ATIVAN) injection 0-4 mg  0-4 mg Intravenous Q12H Pieter Partridge, MD       Or  . LORazepam (ATIVAN) tablet 0-4 mg  0-4 mg Oral Q12H Leandro Reasoner Tublu, MD      . methocarbamol (ROBAXIN) tablet 500 mg  500 mg Oral Q8H PRN Leandro Reasoner Tublu, MD      . multivitamin with minerals tablet 1 tablet  1 tablet Oral Daily Leandro Reasoner Tublu, MD   1 tablet at 06/14/19 1610  . naproxen (NAPROSYN) tablet 500 mg  500 mg Oral BID PRN Leandro Reasoner Tublu, MD   500 mg at 06/14/19 9604  . nicotine (NICODERM CQ - dosed in mg/24 hours) patch 21 mg  21 mg Transdermal Daily Leandro Reasoner Tublu, MD   21 mg at 06/14/19 0920  . ondansetron (ZOFRAN) tablet 4 mg  4 mg Oral Q8H PRN Leandro Reasoner Tublu, MD   4 mg at 06/13/19 1022  . sulfamethoxazole-trimethoprim (BACTRIM DS) 800-160 MG per tablet 1 tablet  1 tablet Oral Q12H Pieter Partridge, MD   1 tablet at 06/14/19 5409  . thiamine (VITAMIN B-1) tablet 100 mg  100 mg Oral Daily Leandro Reasoner Tublu, MD   100 mg at 06/14/19 8119   Or  . thiamine (B-1) injection 100 mg  100 mg Intravenous Daily Leandro Reasoner Tublu, MD      . zolpidem (AMBIEN) tablet 5 mg  5 mg Oral QHS PRN Luberta Robertson Raynaldo Opitz, MD        Musculoskeletal: Strength & Muscle Tone: within normal limits Gait & Station: unable to assess Patient leans: unable to assess  Psychiatric Specialty Exam: Physical Exam  Nursing note and vitals reviewed. Constitutional: He is oriented to person, place, and time. He appears well-developed.  HENT:  Head: Normocephalic.  Cardiovascular: Normal rate.  Respiratory: Effort normal.  Neurological: He is alert and oriented to person, place, and time.  Skin:  Self-inflicted abrasions noted to face and bilateral  ears.     Review of Systems  Constitutional: Negative.   HENT: Negative.   Eyes: Negative.   Respiratory: Negative.   Cardiovascular: Negative.   Gastrointestinal: Negative.   Genitourinary: Negative.   Musculoskeletal: Negative.   Skin:       Self-inflicted abrasions noted to visualized on face and bilateral ears.   Neurological: Negative.   Endo/Heme/Allergies: Negative.     Blood pressure 127/80, pulse 64, temperature 98.1 F (36.7 C), temperature source Oral, resp. rate 18, SpO2 99 %.There is no height or weight on file to calculate BMI.  General Appearance: Casual  Eye Contact:  Good  Speech:  Clear and Coherent  Volume:  Normal  Mood:  Anxious  Affect:  Tearful  Thought Process:  Coherent and Descriptions  of Associations: Intact  Orientation:  Full (Time, Place, and Person)  Thought Content:  Logical  Suicidal Thoughts:  No  Homicidal Thoughts:  No  Memory:  Immediate;   Fair Recent;   Fair Remote;   Fair  Judgement:  Fair  Insight:  Fair  Psychomotor Activity:  Normal  Concentration:  Concentration: Fair and Attention Span: Fair  Recall:  Good  Fund of Knowledge:  Fair  Language:  Fair  Akathisia:  No  Handed:  Right  AIMS (if indicated):     Assets:  Communication Skills Desire for Improvement  ADL's:  Intact  Cognition:  WNL  Sleep:        Treatment Plan Summary:   Consider social work consult as patient is currently homeless and COVID positive.   Consider avoidance of potentially addictive medications and stimulants as patient has a documented history of use disorder.   Please consult psychiatry again if necessary.   Disposition: No evidence of imminent risk to self or others at present.   Supportive therapy provided about ongoing stressors. Discussed crisis plan, support from social network, calling 911, coming to the Emergency Department, and calling Suicide Hotline.  This service was provided via telemedicine using a 2-way, interactive audio  and video technology.  Names of all persons participating in this telemedicine service and their role in this encounter. Name: John [name] Role: Patient  Name: Berneice Heinrichina Tate Role: FNP    Patrcia Dollyina L Tate, FNP 06/14/2019 11:39 AM   Attest to NP note

## 2019-06-14 NOTE — Progress Notes (Signed)
9/27/200 Patient was crying while RN in room. Rn ask whether patient wanted to talk to the chaplin, patient stated "no they cannot help". Patient stated " a gun to the head will". Dr Algis Liming was made aware [facility] RN.

## 2019-06-14 NOTE — Progress Notes (Signed)
Pt stated that he has a court appearence tomorrow 9/28.

## 2019-06-14 NOTE — Progress Notes (Signed)
PROGRESS NOTE   John [name]  IEP:[id number]    DOB: 10/06/1980    DOA: 06/12/2019  PCP: Patient, No Pcp Per   I have briefly reviewed patients previous medical records in [facility].  Chief Complaint  Patient presents with   Suicidal   meth abuse   Hallucinations    Brief Narrative:  38 year old male with PMH of polysubstance abuse (opioids, methamphetamines, cocaine, cannabis,??  Alcohol), hepatitis C, asthma, documented drug-induced CVA and seizures, schizoaffective disorder, bipolar type, PTSD and depression presented to the ED 9/25 requesting detox from his multiple substance abuse, help with depression and stated that he had attempted suicide with heroin overdose several times in the past month.  Patient was incidentally found to be COVID 19+/asymptomatic and hence was admitted to the medical service at [facility] with psychiatric consulting.  Placed on suicide precautions and a Recruitment consultant.  As per psychiatry reevaluation on 9/27, no evidence of imminent risk to self or others at present, suicide precautions discontinued, social work consulted for assistance.  Assessment & Plan:   Principal Problem:   Suicidal intent Active Problems:   Hepatitis C   Schizoaffective disorder, bipolar type (HCC)   PTSD (post-traumatic stress disorder)   Cocaine use disorder, moderate, dependence (HCC)   Cannabis use disorder, moderate, dependence (HCC)   Alcohol use disorder, moderate, dependence (HCC)   Opioid use disorder, moderate, dependence (HCC)   Neuroleptic induced acute dystonia   Depression with suicidal ideation  Acute kidney injury  Resolved.   COVID-19 positive/asymptomatic  Continue airborne and contact precautions.  Monitor closely for development of new symptoms or signs.  Has known signs or symptoms.  Hemodynamically stable and not hypoxic.  Polysubstance abuse  As noted above.  Patient was placed on clonidine taper by psychiatry, complete course.  Patient's  history of substance abuse has been inconsistent and behavior appears manipulative to get medications.  Yesterday he told the psychiatrist that he denied alcohol use but today he states that he drinks a gallon of vodka daily.    He volunteers to using IV methamphetamine, heroin together, cocaine, tobacco a pack per day, alcohol as discussed above, liquid hydrocodone.  As communicated extensively with psychiatry today, patient's reports of symptoms to nursing staff are incongruent with those reported to psychiatry.  He does not have vital signs consistent with withdrawal symptoms (not tachycardic, tachypneic or consistently hypertensive).  Thereby they recommended discontinuing Ativan, social work consult to assist with admission to inpatient substance abuse treatment facilities.  Depression with suicidal ideations  Psychiatry input appreciated.  Discussed in detail with FNP  As per psychiatry recommendation, initiated Prozac 10 mg daily for depression, Abilify 2.5 mg daily for mood and discontinued Ativan.  As per chart review, patient apparently has history of dystonic reactions from neuroleptics.  As per psychiatry follow-up today, patient is not at imminent risk to himself or others.  As communicated with them, discontinued suicide precautions.  Hepatitis C  Outpatient follow-up.   DVT prophylaxis: Lovenox Code Status: Full Family Communication: None at bedside Disposition: Psychiatry has signed off, clinical social work consulted for resources discussed above at which point patient will be ready for discharge.   Consultants:  Psychiatry  Procedures:  None  Antimicrobials:  None   Subjective: Overnight nursing progress notes appreciated.  Patient had several questions about his psychiatric medications including Ativan which I advised him to discuss with psychiatry.  Patient denies SI, HI or AVH.  As per psychiatry his reports to nursing and them  have been incongruent. No  dyspnea, chest pain or cough reported.  Objective:  Vitals:   06/13/19 2000 06/14/19 0055 06/14/19 0309 06/14/19 0741  BP: 125/81 103/66 132/90 127/80  Pulse: 92 77 76 64  Resp: 20 16 16 18   Temp: 98.6 F (37 C) 97.7 F (36.5 C) (!) 97.5 F (36.4 C) 98.1 F (36.7 C)  TempSrc: Oral Oral Oral Oral  SpO2: 98% 96% 100% 99%    Examination:  General exam: Pleasant young male, moderately built and nourished lying in bed and does not appear in any distress. Respiratory system: Clear to auscultation.  No increased work of breathing. Cardiovascular system: S1 and S2 heard, RRR.  No JVD, murmurs or pedal edema. Gastrointestinal system: Abdomen is nondistended, soft and nontender. No organomegaly or masses felt. Normal bowel sounds heard. Central nervous system: Alert and oriented. No focal neurological deficits. Extremities: Symmetric 5 x 5 power. Skin: Multiple scars of healing wounds on bridge of nose, right arm and years. Psychiatry: Judgement and insight appear impaired. Mood & affect slightly anxious.     Data Reviewed: I have personally reviewed following labs and imaging studies  CBC: Recent Labs  Lab 06/12/19 0829 06/13/19 0519  WBC 9.5 4.6  NEUTROABS 6.4  --   HGB 15.4 12.8*  HCT 46.2 37.8*  MCV 92.6 92.9  PLT 371 557   Basic Metabolic Panel: Recent Labs  Lab 06/12/19 0829 06/13/19 0519  NA 136 136  K 3.7 3.6  CL 99 103  CO2 23 24  GLUCOSE 105* 85  BUN 10 11  CREATININE 1.61* 1.11  CALCIUM 9.7 8.3*   Liver Function Tests: Recent Labs  Lab 06/12/19 0829 06/13/19 0519  AST 54* 38  ALT 80* 53*  ALKPHOS 97 70  BILITOT 1.2 0.6  PROT 8.3* 6.2*  ALBUMIN 4.1 2.9*    Cardiac Enzymes: No results for input(s): CKTOTAL, CKMB, CKMBINDEX, TROPONINI in the last 168 hours.  CBG: Recent Labs  Lab 06/12/19 2040  GLUCAP 81    Recent Results (from the past 240 hour(s))  SARS Coronavirus 2 [facility] order, Performed in [facility] hospital lab)  Nasopharyngeal Nasopharyngeal Swab     Status: Abnormal   Collection Time: 06/12/19  9:00 AM   Specimen: Nasopharyngeal Swab  Result Value Ref Range Status   SARS Coronavirus 2 POSITIVE (A) NEGATIVE Final    Comment: RESULT CALLED TO, READ BACK BY AND VERIFIED WITH: Edwena Bunde RN 12:25 06/12/19 ([email or web address]) (NOTE) If result is NEGATIVE SARS-CoV-2 target nucleic acids are NOT DETECTED. The SARS-CoV-2 RNA is generally detectable in upper and lower  respiratory specimens during the acute phase of infection. The lowest  concentration of SARS-CoV-2 viral copies this assay can detect is 250  copies / mL. A negative result does not preclude SARS-CoV-2 infection  and should not be used as the sole basis for treatment or other  patient management decisions.  A negative result may occur with  improper specimen collection / handling, submission of specimen other  than nasopharyngeal swab, presence of viral mutation(s) within the  areas targeted by this assay, and inadequate number of viral copies  (<250 copies / mL). A negative result must be combined with clinical  observations, patient history, and epidemiological information. If result is POSITIVE SARS-CoV-2 target nucleic acids are DETECTED.  The SARS-CoV-2 RNA is generally detectable in upper and lower  respiratory specimens during the acute phase of infection.  Positive  results are indicative of active infection with SARS-CoV-2.  Clinical  correlation with patient history and other diagnostic information is  necessary to determine patient infection status.  Positive results do  not rule out bacterial infection or co-infection with other viruses. If result is PRESUMPTIVE POSTIVE SARS-CoV-2 nucleic acids MAY BE PRESENT.   A presumptive positive result was obtained on the submitted specimen  and confirmed on repeat testing.  While 2019 novel coronavirus  (SARS-CoV-2) nucleic acids may be present in the submitted sample  additional confirmatory  testing may be necessary for epidemiological  and / or clinical management purposes  to differentiate between  SARS-CoV-2 and other Sarbecovirus currently known to infect humans.  If clinically indicated additional testing with an alternate test  methodology [phone]([id number]) i s advised. The SARS-CoV-2 RNA is generally  detectable in upper and lower respiratory specimens during the acute  phase of infection. The expected result is Negative. Fact Sheet for Patients:  BoilerBrush.com.cyhttps://www.fda.gov/media/136312/download Fact Sheet for Healthcare Providers: https://pope.com/https://www.fda.gov/media/136313/download This test is not yet approved or cleared by the [place] FDA and has been authorized for detection and/or diagnosis of SARS-CoV-2 by FDA under an Emergency Use Authorization (EUA).  This EUA will remain in effect (meaning this test can be used) for the duration of the COVID-19 declaration under Section 564(b)(1) of the Act, 21 U.S.C. section 360bbb-3(b)(1), unless the authorization is terminated or revoked sooner. Performed at [facility], [address] [place], [place] [address]   Blood Culture (routine x 2)     Status: None (Preliminary result)   Collection Time: 06/12/19  3:35 PM   Specimen: BLOOD LEFT ARM  Result Value Ref Range Status   Specimen Description BLOOD LEFT ARM  Final   Special Requests   Final    BOTTLES DRAWN AEROBIC AND ANAEROBIC Blood Culture adequate volume   Culture   Final    NO GROWTH 2 DAYS Performed at [facility], [address] [place], [place] [address]    Report Status PENDING  Incomplete  Blood Culture (routine x 2)     Status: None (Preliminary result)   Collection Time: 06/12/19  3:51 PM   Specimen: BLOOD RIGHT ARM  Result Value Ref Range Status   Specimen Description BLOOD RIGHT ARM  Final   Special Requests   Final    BOTTLES DRAWN AEROBIC AND ANAEROBIC Blood Culture adequate volume   Culture   Final    NO GROWTH 2 DAYS Performed at [facility], [address] [place], [place] [address]    Report Status PENDING  Incomplete         Radiology Studies: Dg Chest Port 1 View  Result Date: 06/12/2019 CLINICAL DATA:  Pneumonia EXAM: PORTABLE CHEST 1 VIEW COMPARISON:  05/24/2019 FINDINGS: There is some mild prominent interstitial lung markings bilaterally. This is overall similar to prior study. There is no pneumothorax. No large pleural effusion. The heart size is stable. There is no acute osseous abnormality. IMPRESSION: Prominent interstitial lung markings bilaterally, similar to prior study. Findings can be seen with an atypical infectious process such as viral pneumonia. Electronically Signed   By: Katherine Mantlehristopher  Green M.D.   On: 06/12/2019 14:50        Scheduled Meds:  ARIPiprazole  2.5 mg Oral Daily   cloNIDine  0.1 mg Oral BH-qamhs   Followed by   Melene Muller[START ON 06/16/2019] cloNIDine  0.1 mg Oral QAC breakfast   enoxaparin (LOVENOX) injection  40 mg Subcutaneous Q24H   FLUoxetine  10 mg Oral Daily  folic acid  1 mg Oral Daily   LORazepam  0-4 mg Intravenous Q12H   Or   LORazepam  0-4 mg Oral Q12H   multivitamin with minerals  1 tablet Oral Daily   nicotine  21 mg Transdermal Daily   sulfamethoxazole-trimethoprim  1 tablet Oral Q12H   thiamine  100 mg Oral Daily   Or   thiamine  100 mg Intravenous Daily   Continuous Infusions:    LOS: 2 days     Marcellus Scott, MD, FACP, [facility]. [facility]  To contact the attending provider between 7A-7P or the covering provider during after hours 7P-7A, please log into the web site [email or web address] and access using universal [place] password for that web site. If you do not have the password, please call the hospital operator.  06/14/2019, 2:44 PM

## 2019-06-14 NOTE — Progress Notes (Signed)
Received alert and oriented at beginning of shift., pacing about room,requested shower-assisted as indicated, Therafter medicated upon req and as per order-patient intermittently tearful and anxious, verbalized suicidal ideation. Patient states he felt guilty over behavior but feels it is beyond his control since he was a product of drug addicted mother.Emotional support given, allowed to verbalize concerns. Patient VSS stable asleep since medication administration-maintained on 1:1 for suicidal ideation.

## 2019-06-15 DIAGNOSIS — U071 COVID-19: Secondary | ICD-10-CM | POA: Diagnosis not present

## 2019-06-15 NOTE — TOC Initial Note (Signed)
Transition of Care [facility]) - Initial/Assessment Note    Patient Details  Name: John [name] MRN: [id number] Date of Birth: 04/12/1981  [facility] of Care [facility]) CM/SW Contact:    Geralynn Ochs, LCSW Phone Number: 06/15/2019, 4:01 PM  Clinical Narrative:   CSW acknowledging patient homeless with medical needs due to COVID-19. Patient may qualify for hotel voucher through [facility], if patient is interested. CSW attempted to contact patient twice, no answer. CSW reached out to RN to verify patient's phone number, verified the correct number. RN informed patient that CSW would be calling and for patient to answer his phone. CSW called immediately and patient did not answer. CSW left a voicemail for patient to call back.                Expected Discharge Plan: Homeless Shelter Barriers to Discharge: Homeless with medical needs, Active Substance Use - Placement, Inadequate or no insurance   Patient Goals and CMS Choice        Expected Discharge Plan and Services Expected Discharge Plan: [place]       Living arrangements for the past 2 months: Homeless                                      Prior Living Arrangements/Services Living arrangements for the past 2 months: Homeless Lives with:: Self Patient language and need for interpreter reviewed:: No        Need for Family Participation in Patient Care: No (Comment) Care giver support system in place?: No (comment)   Criminal Activity/Legal Involvement Pertinent to Current Situation/Hospitalization: Yes - Comment as needed  Activities of Daily Living Home Assistive Devices/Equipment: None ADL Screening (condition at time of admission) Patient's cognitive ability adequate to safely complete daily activities?: Yes Is the patient deaf or have difficulty hearing?: No Does the patient have difficulty seeing, even when wearing glasses/contacts?: No Does the patient have difficulty concentrating,  remembering, or making decisions?: No Patient able to express need for assistance with ADLs?: Yes Does the patient have difficulty dressing or bathing?: No Independently performs ADLs?: Yes (appropriate for developmental age) Does the patient have difficulty walking or climbing stairs?: No Weakness of Legs: None Weakness of Arms/Hands: None  Permission Sought/Granted                  Emotional Assessment       Orientation: : Oriented to Self, Oriented to Place, Oriented to  Time, Oriented to Situation Alcohol / Substance Use: Illicit Drugs, Alcohol Use Psych Involvement: Yes (comment)  Admission diagnosis:  Suicidal ideation [R45.851] Polysubstance (including opioids) dependence, daily use ([place]) [F11.20, F19.20] COVID-19 virus detected [U07.1] Suicidal intent [R45.851] Patient Active Problem List   Diagnosis Date Noted  . Suicidal intent 06/12/2019  . Abscess of left upper extremity 09/18/2017  . Hx of intrinsic asthma 09/18/2017  . Polysubstance abuse ([place]) 09/18/2017  . Depression with suicidal ideation 09/18/2017  . Substance induced mood disorder ([place]) 09/18/2017  . Neuroleptic induced acute dystonia 03/22/2016  . Schizoaffective disorder, bipolar type ([place]) 03/20/2016  . PTSD (post-traumatic stress disorder) 03/20/2016  . Cocaine use disorder, moderate, dependence ([place]) 03/20/2016  . Stimulant use disorder 03/20/2016  . Cannabis use disorder, moderate, dependence ([place]) 03/20/2016  . Alcohol use disorder, moderate, dependence ([place]) 03/20/2016  . Opioid use disorder, moderate, dependence ([place]) 03/20/2016  . Hepatitis C 10/13/2013   PCP:  Patient, No Pcp Per Pharmacy:   [id number] Rosalita Levan, [place] - [address] AT [facility] [address] [place] [address] Phone: [phone] Fax: [phone]     Social Determinants of Health (SDOH) Interventions    Readmission Risk Interventions No flowsheet  data found.

## 2019-06-15 NOTE — Progress Notes (Signed)
PROGRESS NOTE   John [name]  [id number]:[id number]    DOB: Jan 03, 1981    DOA: 06/12/2019  PCP: Patient, No Pcp Per   I have briefly reviewed patients previous medical records in [facility].  Chief Complaint  Patient presents with  . Suicidal  . meth abuse  . Hallucinations    Brief Narrative:  38 year old male with PMH of polysubstance abuse (opioids, methamphetamines, cocaine, cannabis,??  Alcohol), hepatitis C, asthma, documented drug-induced CVA and seizures, schizoaffective disorder, bipolar type, PTSD and depression presented to the ED 9/25 requesting detox from his multiple substance abuse, help with depression and stated that he had attempted suicide with heroin overdose several times in the past month.  Patient was incidentally found to be COVID 19+/asymptomatic and hence was admitted to the medical service at [facility] with psychiatric consulting.  Placed on suicide precautions and a Recruitment consultant.  As per psychiatry reevaluation on 9/27, no evidence of imminent risk to self or others at present, suicide precautions discontinued, social work consulted for assistance. Patient medical stable for DC to inpatient substance abuse rehab facility or a place to stay (homeless) as arranged by CSW.  Assessment & Plan:   Principal Problem:   Suicidal intent Active Problems:   Hepatitis C   Schizoaffective disorder, bipolar type (HCC)   PTSD (post-traumatic stress disorder)   Cocaine use disorder, moderate, dependence (HCC)   Cannabis use disorder, moderate, dependence (HCC)   Alcohol use disorder, moderate, dependence (HCC)   Opioid use disorder, moderate, dependence (HCC)   Neuroleptic induced acute dystonia   Depression with suicidal ideation  Acute kidney injury  Resolved.   COVID-19 positive/asymptomatic  Continue airborne and contact precautions.  Monitor closely for development of new symptoms or signs.  Has known signs or symptoms.  Hemodynamically stable and not hypoxic.   Polysubstance abuse  As noted above.  Patient was placed on clonidine taper by psychiatry, complete course.  Patient's history of substance abuse has been inconsistent and behavior appears manipulative to get medications.  Yesterday he told the psychiatrist that he denied alcohol use but today he states that he drinks a gallon of vodka daily.    He volunteers to using IV methamphetamine, heroin together, cocaine, tobacco a pack per day, alcohol as discussed above, liquid hydrocodone.  As communicated extensively with psychiatry on 9/27, patient's reports of symptoms to nursing staff are incongruent with those reported to psychiatry.  He does not have vital signs consistent with withdrawal symptoms (not tachycardic, tachypneic or consistently hypertensive).  Thereby they recommended discontinuing Ativan, social work consult to assist with admission to inpatient substance abuse treatment facilities.  No signs and symptoms of withdrawal noted.  Depression with suicidal ideations  Psychiatry input appreciated.  Discussed in detail with FNP  As per psychiatry recommendation, initiated Prozac 10 mg daily for depression, Abilify 2.5 mg daily for mood and discontinued Ativan.  As per chart review, patient apparently has history of dystonic reactions from neuroleptics.  As per psychiatry follow-up 9/27, patient is not at imminent risk to himself or others.  As communicated with them, discontinued suicide precautions.  Stable without SI/HI/AVH  Hepatitis C  Outpatient follow-up.   DVT prophylaxis: Lovenox Code Status: Full Family Communication: None at bedside Disposition: Psychiatry has signed off, clinical social work consulted for resources discussed above at which point patient will be ready for discharge. Currently unsafe DC becoz is homeless and nowhere to go.   Consultants:  Psychiatry  Procedures:  None  Antimicrobials:  None  Subjective: Denies complaints. No  SI/HI/AVH. No complaints reported. As per RN, no acute issues.  Objective:  Vitals:   06/14/19 2108 06/14/19 2343 06/15/19 0800 06/15/19 1700  BP: 116/90 115/79 127/79 104/67  Pulse: 90 71 89 71  Resp:   18 15  Temp:  98.4 F (36.9 C) 97.7 F (36.5 C) 97.9 F (36.6 C)  TempSrc:  Oral Oral Oral  SpO2:  95% 100% 99%    Examination:  General exam: Pleasant young male, moderately built and nourished lying in bed and does not appear in any distress. Respiratory system: Clear to auscultation.  No increased work of breathing. Cardiovascular system: S1 and S2 heard, RRR.  No JVD, murmurs or pedal edema. Gastrointestinal system: Abdomen is nondistended, soft and nontender. No organomegaly or masses felt. Normal bowel sounds heard. Central nervous system: Alert and oriented. No focal neurological deficits. Extremities: Symmetric 5 x 5 power. Skin: Multiple scars of healing wounds on bridge of nose, right arm and years. Psychiatry: Judgement and insight appear impaired. Mood & affect > appropriate and not anxious.     Data Reviewed: I have personally reviewed following labs and imaging studies  CBC: Recent Labs  Lab 06/12/19 0829 06/13/19 0519  WBC 9.5 4.6  NEUTROABS 6.4  --   HGB 15.4 12.8*  HCT 46.2 37.8*  MCV 92.6 92.9  PLT 371 230   Basic Metabolic Panel: Recent Labs  Lab 06/12/19 0829 06/13/19 0519  NA 136 136  K 3.7 3.6  CL 99 103  CO2 23 24  GLUCOSE 105* 85  BUN 10 11  CREATININE 1.61* 1.11  CALCIUM 9.7 8.3*   Liver Function Tests: Recent Labs  Lab 06/12/19 0829 06/13/19 0519  AST 54* 38  ALT 80* 53*  ALKPHOS 97 70  BILITOT 1.2 0.6  PROT 8.3* 6.2*  ALBUMIN 4.1 2.9*    Cardiac Enzymes: No results for input(s): CKTOTAL, CKMB, CKMBINDEX, TROPONINI in the last 168 hours.  CBG: Recent Labs  Lab 06/12/19 2040  GLUCAP 81    Recent Results (from the past 240 hour(s))  SARS Coronavirus 2 [facility](Hospital order, Performed in [facility] hospital lab)  Nasopharyngeal Nasopharyngeal Swab     Status: Abnormal   Collection Time: 06/12/19  9:00 AM   Specimen: Nasopharyngeal Swab  Result Value Ref Range Status   SARS Coronavirus 2 POSITIVE (A) NEGATIVE Final    Comment: RESULT CALLED TO, READ BACK BY AND VERIFIED WITH: Donato SchultzE. Adkins RN 12:25 06/12/19 ([email or web address]) (NOTE) If result is NEGATIVE SARS-CoV-2 target nucleic acids are NOT DETECTED. The SARS-CoV-2 RNA is generally detectable in upper and lower  respiratory specimens during the acute phase of infection. The lowest  concentration of SARS-CoV-2 viral copies this assay can detect is 250  copies / mL. A negative result does not preclude SARS-CoV-2 infection  and should not be used as the sole basis for treatment or other  patient management decisions.  A negative result may occur with  improper specimen collection / handling, submission of specimen other  than nasopharyngeal swab, presence of viral mutation(s) within the  areas targeted by this assay, and inadequate number of viral copies  (<250 copies / mL). A negative result must be combined with clinical  observations, patient history, and epidemiological information. If result is POSITIVE SARS-CoV-2 target nucleic acids are DETECTED.  The SARS-CoV-2 RNA is generally detectable in upper and lower  respiratory specimens during the acute phase of infection.  Positive  results are indicative of active infection with SARS-CoV-2.  Clinical  correlation with patient history and other diagnostic information is  necessary to determine patient infection status.  Positive results do  not rule out bacterial infection or co-infection with other viruses. If result is PRESUMPTIVE POSTIVE SARS-CoV-2 nucleic acids MAY BE PRESENT.   A presumptive positive result was obtained on the submitted specimen  and confirmed on repeat testing.  While 2019 novel coronavirus  (SARS-CoV-2) nucleic acids may be present in the submitted sample  additional confirmatory  testing may be necessary for epidemiological  and / or clinical management purposes  to differentiate between  SARS-CoV-2 and other Sarbecovirus currently known to infect humans.  If clinically indicated additional testing with an alternate test  methodology [phone]) i s advised. The SARS-CoV-2 RNA is generally  detectable in upper and lower respiratory specimens during the acute  phase of infection. The expected result is Negative. Fact Sheet for Patients:  BoilerBrush.com.cy Fact Sheet for Healthcare Providers: https://pope.com/ This test is not yet approved or cleared by the [place] FDA and has been authorized for detection and/or diagnosis of SARS-CoV-2 by FDA under an Emergency Use Authorization (EUA).  This EUA will remain in effect (meaning this test can be used) for the duration of the COVID-19 declaration under Section 564(b)(1) of the Act, 21 U.S.C. section 360bbb-3(b)(1), unless the authorization is terminated or revoked sooner. Performed at [facility], [address] [place], [place] [address]   Blood Culture (routine x 2)     Status: None (Preliminary result)   Collection Time: 06/12/19  3:35 PM   Specimen: BLOOD LEFT ARM  Result Value Ref Range Status   Specimen Description BLOOD LEFT ARM  Final   Special Requests   Final    BOTTLES DRAWN AEROBIC AND ANAEROBIC Blood Culture adequate volume   Culture   Final    NO GROWTH 3 DAYS Performed at [facility], [address] [place], [place] [address]    Report Status PENDING  Incomplete  Blood Culture (routine x 2)     Status: None (Preliminary result)   Collection Time: 06/12/19  3:51 PM   Specimen: BLOOD RIGHT ARM  Result Value Ref Range Status   Specimen Description BLOOD RIGHT ARM  Final   Special Requests   Final    BOTTLES DRAWN AEROBIC AND ANAEROBIC Blood Culture adequate volume   Culture   Final    NO GROWTH 3 DAYS Performed at [facility], [address], [place], [place] [address]    Report Status PENDING  Incomplete         Radiology Studies: No results found.      Scheduled Meds: . ARIPiprazole  2.5 mg Oral Daily  . cloNIDine  0.1 mg Oral BH-qamhs   Followed by  . [START ON 06/16/2019] cloNIDine  0.1 mg Oral QAC breakfast  . enoxaparin (LOVENOX) injection  40 mg Subcutaneous Q24H  . FLUoxetine  10 mg Oral Daily  . folic acid  1 mg Oral Daily  . multivitamin with minerals  1 tablet Oral Daily  . nicotine  21 mg Transdermal Daily  . sulfamethoxazole-trimethoprim  1 tablet Oral Q12H  . thiamine  100 mg Oral Daily   Or  . thiamine  100 mg Intravenous Daily   Continuous Infusions:    LOS: 3 days     Marcellus Scott, MD, FACP, [facility]. [facility]  To contact the attending provider between 7A-7P or the covering provider during after hours 7P-7A, please log  into the web site [email or web address] and access using universal [place] password for that web site. If you do not have the password, please call the hospital operator.  06/15/2019, 6:45 PM

## 2019-06-15 NOTE — Progress Notes (Signed)
attempted to reach case management and/or  social work at this time. No call backs at the moment. Pt phone at bedside and discussed the importance of answering calls on the Hospital phone

## 2019-06-16 ENCOUNTER — Encounter (HOSPITAL_COMMUNITY): Payer: Self-pay

## 2019-06-16 DIAGNOSIS — U071 COVID-19: Secondary | ICD-10-CM | POA: Diagnosis not present

## 2019-06-16 DIAGNOSIS — R45851 Suicidal ideations: Secondary | ICD-10-CM | POA: Diagnosis not present

## 2019-06-16 MED ORDER — DOCUSATE SODIUM 100 MG PO CAPS
100.0000 mg | ORAL_CAPSULE | Freq: Two times a day (BID) | ORAL | Status: DC
  Administered 2019-06-16: 100 mg via ORAL
  Filled 2019-06-16 (×5): qty 1

## 2019-06-16 MED ORDER — POLYETHYLENE GLYCOL 3350 17 G PO PACK
17.0000 g | PACK | Freq: Every day | ORAL | Status: DC
  Administered 2019-06-16: 17 g via ORAL
  Filled 2019-06-16 (×3): qty 1

## 2019-06-16 NOTE — Progress Notes (Signed)
PROGRESS NOTE   John [name]  EYC:[id number]    DOB: 1981-05-04    DOA: 06/12/2019  PCP: Patient, No Pcp Per   I have briefly reviewed patients previous medical records in [facility].  Chief Complaint  Patient presents with  . Suicidal  . meth abuse  . Hallucinations    Brief Narrative:  38 year old male with PMH of polysubstance abuse (opioids, methamphetamines, cocaine, cannabis,??  Alcohol), hepatitis C, asthma, documented drug-induced CVA and seizures, schizoaffective disorder, bipolar type, PTSD and depression presented to the ED 9/25 requesting detox from his multiple substance abuse, help with depression and stated that he had attempted suicide with heroin overdose several times in the past month.  Patient was incidentally found to be COVID 19+/asymptomatic and hence was admitted to the medical service at [facility] with psychiatric consulting.  Placed on suicide precautions and a Recruitment consultant.  As per psychiatry reevaluation on 9/27, no evidence of imminent risk to self or others at present, suicide precautions discontinued, social work consulted for assistance. Patient medical stable for DC to inpatient substance abuse rehab facility or a place to stay (homeless) as arranged by CSW.  Assessment & Plan:   Principal Problem:   Suicidal intent Active Problems:   Hepatitis C   Schizoaffective disorder, bipolar type (HCC)   PTSD (post-traumatic stress disorder)   Cocaine use disorder, moderate, dependence (HCC)   Cannabis use disorder, moderate, dependence (HCC)   Alcohol use disorder, moderate, dependence (HCC)   Opioid use disorder, moderate, dependence (HCC)   Neuroleptic induced acute dystonia   Depression with suicidal ideation  Acute kidney injury  Resolved.   COVID-19 positive/asymptomatic  Continue airborne and contact precautions.  Monitor closely for development of new symptoms or signs.    Polysubstance abuse  Patient was placed on clonidine taper by  psychiatry, completed course.  Patient's history of substance abuse has been inconsistent and behavior appears manipulative to get medications.   He volunteers to using IV methamphetamine, heroin together, cocaine, tobacco a pack per day, alcohol as discussed above, liquid hydrocodone.  social work consult to assist with admission to inpatient substance abuse treatment facilities or a safe place to go somewhere.  Per Child psychotherapist, they can have him get hotel voucher through publicly department however there is no bed available at this point in time and he is on waiting list.  No signs and symptoms of withdrawal noted.  Depression with suicidal ideations  As per psychiatry recommendation, initiated Prozac 10 mg daily for depression, Abilify 2.5 mg daily for mood and discontinued Ativan.  As per chart review, patient apparently has history of dystonic reactions from neuroleptics.  As per psychiatry follow-up 9/27, patient is not at imminent risk to himself or others.  As communicated with them, discontinued suicide precautions.  Stable without SI/HI/AVH  Hepatitis C  Outpatient follow-up.   DVT prophylaxis: Lovenox Code Status: Full Family Communication: None at bedside Disposition: Psychiatry has signed off, clinical social work consulted for resources discussed above at which point patient will be ready for discharge. Currently unsafe DC becoz he is homeless and nowhere to go.  Per Child psychotherapist, they are working on getting him out of the room summer however that is not available at this point in time and he is on wait list.  Consultants:  Psychiatry  Procedures:  None  Antimicrobials:  None   Subjective: Patient seen and examined.  He complained of nausea and also stated that he threw up this morning.  Feeling  better now.  No complaint.  Objective:  Vitals:   06/15/19 0800 06/15/19 1700 06/15/19 2100 06/16/19 0746  BP: 127/79 104/67 (!) 93/48 99/73  Pulse: 89 71 69  69  Resp: 18 15 14    Temp: 97.7 F (36.5 C) 97.9 F (36.6 C) 98.1 F (36.7 C) 97.7 F (36.5 C)  TempSrc: Oral Oral Oral Oral  SpO2: 100% 99% 96% 95%    Examination:  General exam: Appears calm and comfortable  Respiratory system: Clear to auscultation. Respiratory effort normal. Cardiovascular system: S1 & S2 heard, RRR. No JVD, murmurs, rubs, gallops or clicks. No pedal edema. Gastrointestinal system: Abdomen is nondistended, soft and nontender. No organomegaly or masses felt. Normal bowel sounds heard. Central nervous system: Alert and oriented. No focal neurological deficits. Extremities: Symmetric 5 x 5 power. Skin: No rashes, lesions or ulcers.  Psychiatry: Judgement and insight appear normal. Mood & affect appropriate.   Data Reviewed: I have personally reviewed following labs and imaging studies  CBC: Recent Labs  Lab 06/12/19 0829 06/13/19 0519  WBC 9.5 4.6  NEUTROABS 6.4  --   HGB 15.4 12.8*  HCT 46.2 37.8*  MCV 92.6 92.9  PLT 371 230   Basic Metabolic Panel: Recent Labs  Lab 06/12/19 0829 06/13/19 0519  NA 136 136  K 3.7 3.6  CL 99 103  CO2 23 24  GLUCOSE 105* 85  BUN 10 11  CREATININE 1.61* 1.11  CALCIUM 9.7 8.3*   Liver Function Tests: Recent Labs  Lab 06/12/19 0829 06/13/19 0519  AST 54* 38  ALT 80* 53*  ALKPHOS 97 70  BILITOT 1.2 0.6  PROT 8.3* 6.2*  ALBUMIN 4.1 2.9*    Cardiac Enzymes: No results for input(s): CKTOTAL, CKMB, CKMBINDEX, TROPONINI in the last 168 hours.  CBG: Recent Labs  Lab 06/12/19 2040  GLUCAP 81    Recent Results (from the past 240 hour(s))  SARS Coronavirus 2 [facility](Hospital order, Performed in [facility] hospital lab) Nasopharyngeal Nasopharyngeal Swab     Status: Abnormal   Collection Time: 06/12/19  9:00 AM   Specimen: Nasopharyngeal Swab  Result Value Ref Range Status   SARS Coronavirus 2 POSITIVE (A) NEGATIVE Final    Comment: RESULT CALLED TO, READ BACK BY AND VERIFIED WITH: Donato SchultzE. Adkins RN 12:25 06/12/19  ([email or web address]) (NOTE) If result is NEGATIVE SARS-CoV-2 target nucleic acids are NOT DETECTED. The SARS-CoV-2 RNA is generally detectable in upper and lower  respiratory specimens during the acute phase of infection. The lowest  concentration of SARS-CoV-2 viral copies this assay can detect is 250  copies / mL. A negative result does not preclude SARS-CoV-2 infection  and should not be used as the sole basis for treatment or other  patient management decisions.  A negative result may occur with  improper specimen collection / handling, submission of specimen other  than nasopharyngeal swab, presence of viral mutation(s) within the  areas targeted by this assay, and inadequate number of viral copies  (<250 copies / mL). A negative result must be combined with clinical  observations, patient history, and epidemiological information. If result is POSITIVE SARS-CoV-2 target nucleic acids are DETECTED.  The SARS-CoV-2 RNA is generally detectable in upper and lower  respiratory specimens during the acute phase of infection.  Positive  results are indicative of active infection with SARS-CoV-2.  Clinical  correlation with patient history and other diagnostic information is  necessary to determine patient infection status.  Positive results do  not rule out bacterial infection or  co-infection with other viruses. If result is PRESUMPTIVE POSTIVE SARS-CoV-2 nucleic acids MAY BE PRESENT.   A presumptive positive result was obtained on the submitted specimen  and confirmed on repeat testing.  While 2019 novel coronavirus  (SARS-CoV-2) nucleic acids may be present in the submitted sample  additional confirmatory testing may be necessary for epidemiological  and / or clinical management purposes  to differentiate between  SARS-CoV-2 and other Sarbecovirus currently known to infect humans.  If clinically indicated additional testing with an alternate test  methodology [phone]) i s advised. The  SARS-CoV-2 RNA is generally  detectable in upper and lower respiratory specimens during the acute  phase of infection. The expected result is Negative. Fact Sheet for Patients:  StrictlyIdeas.no Fact Sheet for Healthcare Providers: BankingDealers.co.za This test is not yet approved or cleared by the [place] FDA and has been authorized for detection and/or diagnosis of SARS-CoV-2 by FDA under an Emergency Use Authorization (EUA).  This EUA will remain in effect (meaning this test can be used) for the duration of the COVID-19 declaration under Section 564(b)(1) of the Act, 21 U.S.C. section 360bbb-3(b)(1), unless the authorization is terminated or revoked sooner. Performed at [facility], [place] [address], [place], [place] [address]   Blood Culture (routine x 2)     Status: None (Preliminary result)   Collection Time: 06/12/19  3:35 PM   Specimen: BLOOD LEFT ARM  Result Value Ref Range Status   Specimen Description BLOOD LEFT ARM  Final   Special Requests   Final    BOTTLES DRAWN AEROBIC AND ANAEROBIC Blood Culture adequate volume   Culture   Final    NO GROWTH 4 DAYS Performed at [facility], [place] [address], [place], [place] [address]    Report Status PENDING  Incomplete  Blood Culture (routine x 2)     Status: None (Preliminary result)   Collection Time: 06/12/19  3:51 PM   Specimen: BLOOD RIGHT ARM  Result Value Ref Range Status   Specimen Description BLOOD RIGHT ARM  Final   Special Requests   Final    BOTTLES DRAWN AEROBIC AND ANAEROBIC Blood Culture adequate volume   Culture   Final    NO GROWTH 4 DAYS Performed at [facility], [place] [address], [place], [place] [address]    Report Status PENDING  Incomplete     Radiology Studies: No results found.  Scheduled Meds: . ARIPiprazole  2.5 mg Oral Daily  . cloNIDine  0.1 mg Oral QAC breakfast  . docusate sodium  100 mg Oral BID  . enoxaparin  (LOVENOX) injection  40 mg Subcutaneous Q24H  . FLUoxetine  10 mg Oral Daily  . folic acid  1 mg Oral Daily  . multivitamin with minerals  1 tablet Oral Daily  . nicotine  21 mg Transdermal Daily  . polyethylene glycol  17 g Oral Daily  . sulfamethoxazole-trimethoprim  1 tablet Oral Q12H  . thiamine  100 mg Oral Daily   Or  . thiamine  100 mg Intravenous Daily   Continuous Infusions:   LOS: 4 days   Darliss Cheney, MD [facility]  To contact the attending provider between 7A-7P or the covering provider during after hours 7P-7A, please log into the web site [email or web address] and access using universal  password for that web site. If you do not have the password, please call the hospital operator.  06/16/2019, 2:07 PM

## 2019-06-16 NOTE — Progress Notes (Signed)
Belongings given to pt by this nurse and c.n.a. Pt apologized for behaviors.

## 2019-06-16 NOTE — TOC Progression Note (Signed)
Transition of Care [facility]) - Progression Note    Patient Details  Name: John [name] MRN: [id number] Date of Birth: [birth year]  Transition [facility] CM/SW [place], Raoul Phone Number: 06/16/2019, 1:30 PM  Clinical Narrative:   CSW received word from MD that patient is interested in hotel voucher, if he would qualify. CSW attempted to reach out to [facility], was transferred to [place] to discuss hotel voucher who gave a number for [facility] to review patient for the hotel program. [place] left a voicemail. Per voicemail message, they have 24 hours to respond. There are currently no beds available, however.     Expected Discharge Plan: Homeless Shelter Barriers to Discharge: Homeless with medical needs, Active Substance Use - Placement, Inadequate or no insurance  Expected Discharge Plan and Services Expected Discharge Plan: Homeless Shelter       Living arrangements for the past 2 months: Homeless                                       Social Determinants of Health ([place]) Interventions    Readmission Risk Interventions No flowsheet data found.

## 2019-06-16 NOTE — Progress Notes (Signed)
This shift pt rounded on per protocol with no apparent distress noted. 

## 2019-06-16 NOTE — Progress Notes (Signed)
Pt upon givin requested prn states he doesn't feel good. Pt asked what was wrong and how he doesn't feel good. Pt states feels dizzy and having pins and needles. Pt asked where, pt stated chest. Pt asked to point to where pins and needles were felt. Pt pointed to lower epigastric area. MD made aware of pt complaints, requests and behaviors.

## 2019-06-16 NOTE — Progress Notes (Signed)
Pt assisted with iv protection for pt requested shower. Pt asked if he would like a fresh gown. Pt states no he is claustrophobic. Pt then asked about leaving. Pt made aware of no discharge orders at this time. Pt asked about ativan order. Pt made aware no order for ativan at this time. Pt asked about his clonidine. Pt made aware that was scheduled medication. Pt aware of medications. Pt then asked about atarax. Informed patient could look at administering for pt. Pt then asked about his charger for phone and clothes. Pt asked if he checked in closet or drawers to see if belongings were there. Pt stated no they are not there. Pt asked if a family member took them home. Pt stated no and began to get aggressive and stated we should know where his shoes and belongings are. Pt informed that this is the first time this nurse is being informed about belongings and patient missing them. Pt informed that would let charge nurse know. Asked pt what was missing, pt stated a charger, $175 dollar pair of shoes, aeropostale shirt, pants and belongings. Pt informed ok would let the charge nurse know, it may be locked with security or if pt came from another unit. Pt expressed dissatisfaction with missing items in an aggressive way. Charge nurse Suezanne Jacquet made aware of patient belongings.

## 2019-06-17 ENCOUNTER — Encounter (HOSPITAL_COMMUNITY): Payer: Self-pay

## 2019-06-17 DIAGNOSIS — U071 COVID-19: Secondary | ICD-10-CM | POA: Diagnosis not present

## 2019-06-17 DIAGNOSIS — R45851 Suicidal ideations: Secondary | ICD-10-CM | POA: Diagnosis not present

## 2019-06-17 LAB — CBC WITH DIFFERENTIAL/PLATELET
Abs Immature Granulocytes: 0.04 10*3/uL (ref 0.00–0.07)
Basophils Absolute: 0 10*3/uL (ref 0.0–0.1)
Basophils Relative: 1 %
Eosinophils Absolute: 0.1 10*3/uL (ref 0.0–0.5)
Eosinophils Relative: 1 %
HCT: 46.7 % (ref 39.0–52.0)
Hemoglobin: 15.5 g/dL (ref 13.0–17.0)
Immature Granulocytes: 1 %
Lymphocytes Relative: 21 %
Lymphs Abs: 1.8 10*3/uL (ref 0.7–4.0)
MCH: 30.8 pg (ref 26.0–34.0)
MCHC: 33.2 g/dL (ref 30.0–36.0)
MCV: 92.7 fL (ref 80.0–100.0)
Monocytes Absolute: 0.6 10*3/uL (ref 0.1–1.0)
Monocytes Relative: 7 %
Neutro Abs: 6.1 10*3/uL (ref 1.7–7.7)
Neutrophils Relative %: 69 %
Platelets: 283 10*3/uL (ref 150–400)
RBC: 5.04 MIL/uL (ref 4.22–5.81)
RDW: 12.9 % (ref 11.5–15.5)
WBC: 8.7 10*3/uL (ref 4.0–10.5)
nRBC: 0 % (ref 0.0–0.2)

## 2019-06-17 LAB — CULTURE, BLOOD (ROUTINE X 2)
Culture: NO GROWTH
Culture: NO GROWTH
Special Requests: ADEQUATE
Special Requests: ADEQUATE

## 2019-06-17 LAB — BASIC METABOLIC PANEL
Anion gap: 12 (ref 5–15)
BUN: 15 mg/dL (ref 6–20)
CO2: 23 mmol/L (ref 22–32)
Calcium: 9.4 mg/dL (ref 8.9–10.3)
Chloride: 98 mmol/L (ref 98–111)
Creatinine, Ser: 1.27 mg/dL — ABNORMAL HIGH (ref 0.61–1.24)
GFR calc Af Amer: >60 mL/min (ref >60)
GFR calc non Af Amer: >60 mL/min (ref >60)
Glucose, Bld: 109 mg/dL — ABNORMAL HIGH (ref 70–99)
Potassium: 5.1 mmol/L (ref 3.5–5.1)
Sodium: 133 mmol/L — ABNORMAL LOW (ref 135–145)

## 2019-06-17 LAB — MAGNESIUM: Magnesium: 2.2 mg/dL (ref 1.7–2.4)

## 2019-06-17 NOTE — Progress Notes (Signed)
Updated called attempted to [facility] at [phone] no answer unable to leave voicemail.

## 2019-06-17 NOTE — Progress Notes (Signed)
Pt experienced emesis x1 after taking oral temperature and getting vitals. Pt also took nicotine patch off of right arm stating that he did not want it over his tattoo.

## 2019-06-17 NOTE — Progress Notes (Signed)
PROGRESS NOTE   John [name]  ZOX:[id number]    DOB: 1981-03-24    DOA: 06/12/2019  PCP: Patient, No Pcp Per   I have briefly reviewed patients previous medical records in [facility].  Chief Complaint  Patient presents with   Suicidal   meth abuse   Hallucinations    Brief Narrative:  38 year old male with PMH of polysubstance abuse (opioids, methamphetamines, cocaine, cannabis,??  Alcohol), hepatitis C, asthma, documented drug-induced CVA and seizures, schizoaffective disorder, bipolar type, PTSD and depression presented to the ED 9/25 requesting detox from his multiple substance abuse, help with depression and stated that he had attempted suicide with heroin overdose several times in the past month.  Patient was incidentally found to be COVID 19+/asymptomatic and hence was admitted to the medical service at [facility] with psychiatric consulting.  Placed on suicide precautions and a Recruitment consultant.  As per psychiatry reevaluation on 9/27, no evidence of imminent risk to self or others at present, suicide precautions discontinued, social work consulted for assistance. Patient medical stable for DC to inpatient substance abuse rehab facility or a place to stay (homeless) as arranged by CSW.  Assessment & Plan:   Principal Problem:   Suicidal intent Active Problems:   Hepatitis C   Schizoaffective disorder, bipolar type (HCC)   PTSD (post-traumatic stress disorder)   Cocaine use disorder, moderate, dependence (HCC)   Cannabis use disorder, moderate, dependence (HCC)   Alcohol use disorder, moderate, dependence (HCC)   Opioid use disorder, moderate, dependence (HCC)   Neuroleptic induced acute dystonia   Depression with suicidal ideation  Acute kidney injury  Resolved.   COVID-19 positive/asymptomatic  Continue airborne and contact precautions.  Monitor closely for development of new symptoms or signs.    Polysubstance abuse  Patient was placed on clonidine taper by  psychiatry, completed course.  Patient's history of substance abuse has been inconsistent and behavior appears manipulative to get medications.   He volunteers to using IV methamphetamine, heroin together, cocaine, tobacco a pack per day, alcohol as discussed above, liquid hydrocodone.  social work consult to assist with admission to inpatient substance abuse treatment facilities or a safe place to go somewhere.  Per Child psychotherapist, they can have him get hotel voucher through publicly department however there is no bed available at this point in time and he is on waiting list.  No signs and symptoms of withdrawal noted.  Patient has been very manipulative and continues to complain of several different kind of symptoms in order to obtain addictive medications.  He continues to ask for Benadryl, Atarax and Ativan.  Depression with suicidal ideations  As per psychiatry recommendation, initiated Prozac 10 mg daily for depression, Abilify 2.5 mg daily for mood and discontinued Ativan.  As per chart review, patient apparently has history of dystonic reactions from neuroleptics.  As per psychiatry follow-up 9/27, patient is not at imminent risk to himself or others.  As communicated with them, discontinued suicide precautions.  Stable without SI/HI/AVH  Hepatitis C  Outpatient follow-up.   DVT prophylaxis: Lovenox Code Status: Full Family Communication: None at bedside Disposition: Psychiatry has signed off, clinical social work consulted for resources discussed above at which point patient will be ready for discharge. Currently unsafe DC becoz he is homeless and nowhere to go.  Per Child psychotherapist, they are working on getting him A hotel room however that is not available at this point in time and he is on wait list.  Now that he does  not need any subspecialty consultation and is waiting for placement only and in order to cohort COVID  Positive patients we will transfer him  Consultants:    Psychiatry  Procedures:  None  Antimicrobials:  None   Subjective: Patient seen and examined.  Complains of itching all over the body.  No other complaint.  After I finished seeing him and came out of the room, few minutes later, he knocked on the door complaining of having " stiffness in the neck" and stated that he was having allergic reaction and that he needs Benadryl.  Patient was hemodynamically stable.  Prior to this, he was talking to somebody on the phone and was completely comfortable just few minutes ago when I saw him.  Objective:  Vitals:   06/16/19 0746 06/16/19 1723 06/17/19 0300 06/17/19 0745  BP: 99/73 93/62 105/74 114/90  Pulse: 69 78 95 93  Resp:  15  18  Temp: 97.7 F (36.5 C) (!) 97.4 F (36.3 C) (!) 97.5 F (36.4 C) 98 F (36.7 C)  TempSrc: Oral Oral Axillary Oral  SpO2: 95% 97% 96% 98%    Examination:  General exam: Appears calm and comfortable  Respiratory system: Clear to auscultation. Respiratory effort normal. Cardiovascular system: S1 & S2 heard, RRR. No JVD, murmurs, rubs, gallops or clicks. No pedal edema. Gastrointestinal system: Abdomen is nondistended, soft and nontender. No organomegaly or masses felt. Normal bowel sounds heard. Central nervous system: Alert and oriented. No focal neurological deficits. Extremities: Symmetric 5 x 5 power. Skin: No rashes, lesions or ulcers.  Psychiatry: Judgement and insight appear normal. Mood & affect appropriate.   Data Reviewed: I have personally reviewed following labs and imaging studies  CBC: Recent Labs  Lab 06/12/19 0829 06/13/19 0519 06/17/19 0458  WBC 9.5 4.6 8.7  NEUTROABS 6.4  --  6.1  HGB 15.4 12.8* 15.5  HCT 46.2 37.8* 46.7  MCV 92.6 92.9 92.7  PLT 371 230 283   Basic Metabolic Panel: Recent Labs  Lab 06/12/19 0829 06/13/19 0519 06/17/19 0458  NA 136 136 133*  K 3.7 3.6 5.1  CL 99 103 98  CO2 GLUCOSE 105* 85 109*  BUN CREATININE 1.61* 1.11 1.27*   CALCIUM 9.7 8.3* 9.4  MG  --   --  2.2   Liver Function Tests: Recent Labs  Lab 06/12/19 0829 06/13/19 0519  AST 54* 38  ALT 80* 53*  ALKPHOS 97 70  BILITOT 1.2 0.6  PROT 8.3* 6.2*  ALBUMIN 4.1 2.9*    Cardiac Enzymes: No results for input(s): CKTOTAL, CKMB, CKMBINDEX, TROPONINI in the last 168 hours.  CBG: Recent Labs  Lab 06/12/19 2040  GLUCAP 81    Recent Results (from the past 240 hour(s))  SARS Coronavirus 2 [facility] order, Performed in [facility] hospital lab) Nasopharyngeal Nasopharyngeal Swab     Status: Abnormal   Collection Time: 06/12/19  9:00 AM   Specimen: Nasopharyngeal Swab  Result Value Ref Range Status   SARS Coronavirus 2 POSITIVE (A) NEGATIVE Final    Comment: RESULT CALLED TO, READ BACK BY AND VERIFIED WITH: Donato Schultz RN 12:25 06/12/19 ([email or web address]) (NOTE) If result is NEGATIVE SARS-CoV-2 target nucleic acids are NOT DETECTED. The SARS-CoV-2 RNA is generally detectable in upper and lower  respiratory specimens during the acute phase of infection. The lowest  concentration of SARS-CoV-2 viral copies this assay can detect is 250  copies / mL. A negative result does not preclude SARS-CoV-2  infection  and should not be used as the sole basis for treatment or other  patient management decisions.  A negative result may occur with  improper specimen collection / handling, submission of specimen other  than nasopharyngeal swab, presence of viral mutation(s) within the  areas targeted by this assay, and inadequate number of viral copies  (<250 copies / mL). A negative result must be combined with clinical  observations, patient history, and epidemiological information. If result is POSITIVE SARS-CoV-2 target nucleic acids are DETECTED.  The SARS-CoV-2 RNA is generally detectable in upper and lower  respiratory specimens during the acute phase of infection.  Positive  results are indicative of active infection with SARS-CoV-2.  Clinical  correlation with  patient history and other diagnostic information is  necessary to determine patient infection status.  Positive results do  not rule out bacterial infection or co-infection with other viruses. If result is PRESUMPTIVE POSTIVE SARS-CoV-2 nucleic acids MAY BE PRESENT.   A presumptive positive result was obtained on the submitted specimen  and confirmed on repeat testing.  While 2019 novel coronavirus  (SARS-CoV-2) nucleic acids may be present in the submitted sample  additional confirmatory testing may be necessary for epidemiological  and / or clinical management purposes  to differentiate between  SARS-CoV-2 and other Sarbecovirus currently known to infect humans.  If clinically indicated additional testing with an alternate test  methodology [phone] i s advised. The SARS-CoV-2 RNA is generally  detectable in upper and lower respiratory specimens during the acute  phase of infection. The expected result is Negative. Fact Sheet for Patients:  BoilerBrush.com.cyhttps://www.fda.gov/media/136312/download Fact Sheet for Healthcare Providers: https://pope.com/https://www.fda.gov/media/136313/download This test is not yet approved or cleared by the [place] FDA and has been authorized for detection and/or diagnosis of SARS-CoV-2 by FDA under an Emergency Use Authorization (EUA).  This EUA will remain in effect (meaning this test can be used) for the duration of the COVID-19 declaration under Section 564(b)(1) of the Act, 21 U.S.C. section 360bbb-3(b)(1), unless the authorization is terminated or revoked sooner. Performed at [facility], [address] [place], [place] [address]   Blood Culture (routine x 2)     Status: None   Collection Time: 06/12/19  3:35 PM   Specimen: BLOOD LEFT ARM  Result Value Ref Range Status   Specimen Description BLOOD LEFT ARM  Final   Special Requests   Final    BOTTLES DRAWN AEROBIC AND ANAEROBIC Blood Culture adequate volume   Culture   Final    NO GROWTH 5 DAYS Performed  at [facility], [address] [place], [place] [address]    Report Status 06/17/2019 FINAL  Final  Blood Culture (routine x 2)     Status: None   Collection Time: 06/12/19  3:51 PM   Specimen: BLOOD RIGHT ARM  Result Value Ref Range Status   Specimen Description BLOOD RIGHT ARM  Final   Special Requests   Final    BOTTLES DRAWN AEROBIC AND ANAEROBIC Blood Culture adequate volume   Culture   Final    NO GROWTH 5 DAYS Performed at [facility], [address] [place], [place] [address]    Report Status 06/17/2019 FINAL  Final     Radiology Studies: No results found.  Scheduled Meds:  ARIPiprazole  2.5 mg Oral Daily   docusate sodium  100 mg Oral BID   FLUoxetine  10 mg Oral Daily   folic acid  1 mg Oral  Daily   multivitamin with minerals  1 tablet Oral Daily   nicotine  21 mg Transdermal Daily   polyethylene glycol  17 g Oral Daily   sulfamethoxazole-trimethoprim  1 tablet Oral Q12H   thiamine  100 mg Oral Daily   Or   thiamine  100 mg Intravenous Daily   Continuous Infusions:   LOS: 5 days   Darliss Cheney, MD [facility]  To contact the attending provider between 7A-7P or the covering provider during after hours 7P-7A, please log into the web site [email or web address] and access using universal [place] password for that web site. If you do not have the password, please call the hospital operator.  06/17/2019, 12:41 PM

## 2019-06-18 DIAGNOSIS — U071 COVID-19: Secondary | ICD-10-CM | POA: Diagnosis not present

## 2019-06-18 DIAGNOSIS — F1220 Cannabis dependence, uncomplicated: Secondary | ICD-10-CM | POA: Diagnosis not present

## 2019-06-18 DIAGNOSIS — F1020 Alcohol dependence, uncomplicated: Secondary | ICD-10-CM | POA: Diagnosis not present

## 2019-06-18 DIAGNOSIS — R45851 Suicidal ideations: Secondary | ICD-10-CM | POA: Diagnosis not present

## 2019-06-18 DIAGNOSIS — F250 Schizoaffective disorder, bipolar type: Secondary | ICD-10-CM

## 2019-06-18 DIAGNOSIS — F1120 Opioid dependence, uncomplicated: Secondary | ICD-10-CM

## 2019-06-18 MED ORDER — CLONIDINE HCL 0.1 MG PO TABS
0.1000 mg | ORAL_TABLET | Freq: Two times a day (BID) | ORAL | Status: DC
  Administered 2019-06-18: 10:00:00 0.1 mg via ORAL
  Filled 2019-06-18: qty 1

## 2019-06-18 MED ORDER — HYDROXYZINE HCL 25 MG PO TABS: 25.0000 mg | ORAL_TABLET | Freq: Four times a day (QID) | ORAL | 0 refills | Status: DC | PRN

## 2019-06-18 MED ORDER — CLONIDINE HCL 0.1 MG PO TABS: 0.1000 mg | ORAL_TABLET | Freq: Two times a day (BID) | ORAL | 0 refills | Status: DC

## 2019-06-18 MED ORDER — LORAZEPAM 2 MG/ML IJ SOLN
0.5000 mg | Freq: Once | INTRAMUSCULAR | Status: AC
  Administered 2019-06-18: 0.5 mg via INTRAVENOUS
  Filled 2019-06-18: qty 1

## 2019-06-18 MED ORDER — ARIPIPRAZOLE 5 MG PO TABS: 2.5000 mg | ORAL_TABLET | Freq: Every day | ORAL | 0 refills | Status: DC

## 2019-06-18 MED ORDER — FLUOXETINE HCL 10 MG PO CAPS: 10.0000 mg | ORAL_CAPSULE | Freq: Every day | ORAL | 0 refills | Status: DC

## 2019-06-18 MED FILL — hydrOXYzine HCL 25 MG TABS: 25 | 4 days supply | Qty: 15 | Fill #0

## 2019-06-18 MED FILL — FLUoxetine HCL 10 MG CAPS: 10 | 30 days supply | Qty: 30 | Fill #0

## 2019-06-18 MED FILL — ARIPiprazole 5 MG TABS: 5 | 30 days supply | Qty: 15 | Fill #0

## 2019-06-18 MED FILL — cloNIDine HCL 0.1 MG TABS: 0.1 | 30 days supply | Qty: 60 | Fill #0

## 2019-06-18 NOTE — Discharge Summary (Signed)
Physician Discharge Summary  John [name] ZOX:[id number] DOB: October 18, 1980 DOA: 06/12/2019  PCP: Patient, No Pcp Per  Admit date: 06/12/2019 Discharge date: 06/18/2019  Admitted From: Home Disposition: Hotel   Recommendations for Outpatient Follow-up:  1. Follow up with PCP and psychiatry within next 1-2 weeks 2. Consider avoidance of potentially addictive medications and stimulants as patient has a documented history of use disorder.   Home Health: None Equipment/Devices: None Discharge Condition: Stable CODE STATUS: Full Diet recommendation: As tolerated  Brief/Interim Summary: 38 year old male with PMH of polysubstance abuse (opioids, methamphetamines, cocaine, cannabis,??  Alcohol), hepatitis C, asthma, documented drug-induced CVA and seizures, schizoaffective disorder, bipolar type, PTSD and depression presented to the ED 9/25 requesting detox from his multiple substance abuse, help with depression and stated that he had attempted suicide with heroin overdose several times in the past month.  Patient was incidentally found to be COVID 19+/asymptomatic and hence was admitted to the medical service at [facility] with psychiatric consulting.  Placed on suicide precautions and a Recruitment consultant.  As per psychiatry reevaluation on 9/27, no evidence of imminent risk to self or others at present, suicide precautions discontinued, social work consulted for assistance. Safe discharge plan to hotel has been arranged, medications as recommended by psychiatry are prescribed and provided to the patient at no cost to him prior to discharge. He is medically stable for discharge, having no symptoms attributable to covid-19 infection. There is no evidence of substance withdrawal at time of discharge.  Discharge Diagnoses:  Principal Problem:   Suicidal intent Active Problems:   Hepatitis C   Schizoaffective disorder, bipolar type (HCC)   PTSD (post-traumatic stress disorder)   Cocaine use disorder, moderate,  dependence (HCC)   Cannabis use disorder, moderate, dependence (HCC)   Alcohol use disorder, moderate, dependence (HCC)   Opioid use disorder, moderate, dependence (HCC)   Neuroleptic induced acute dystonia   Depression with suicidal ideation  Acute kidney injury  Resolved.  Asymptomatic covid-19 infection.  Recommend isolation for 14 days. Hotel arranged.  Polysubstance abuse  Patient was placed on clonidine taper by psychiatry, completed course. Reports that this had beneficial effect for anxiety, so this will be restarted.   Patient's history of substance abuse has been inconsistent and behavior appears manipulative to get medications.   He reported using IV methamphetamine, heroin together, cocaine, tobacco a pack per day, alcohol as discussed above, liquid hydrocodone.  Due to covid positivity, patient is not a candidate for inpatient substance abuse treatment placement at this time.   No signs and symptoms of withdrawal noted.  Patient has been very manipulative and continues to complain of several different kind of symptoms in order to obtain addictive medications.  Depression with suicidal ideations  As per psychiatry recommendation, initiated Prozac 10 mg daily for depression, Abilify 2.5 mg daily for mood and discontinued ativan. Did provide prescription for atarax prn anxiety.   As per chart review, patient apparently has history of dystonic reactions from neuroleptics.  As per psychiatry follow-up 9/27, patient is not at imminent risk to himself or others.  Continues to report no SI/HI at time of discharge.  Hepatitis C  Outpatient follow-up.  Discharge Instructions Discharge Instructions    Discharge instructions   Complete by: As directed    You are being discharged from the hospital after treatment for covid-19 infection. You are felt to be stable enough to no longer require inpatient monitoring, testing, and treatment, though you will need to follow the  recommendations below: - Continue  taking abilify, fluoxetine, clonidine and as-needed hydroxyzine (prescriptions provided at discharge) - Follow up with psychiatry as an outpatient. - Remain in self-isolation for 14 days following discharge to reduce risk of transmission of the virus.  - Do not take NSAID medications (including, but not limited to, ibuprofen, advil, motrin, naproxen, aleve, goody's powder, etc.) - Follow up with your doctor in the next week via telehealth or seek medical attention right away if your symptoms get WORSE.  - Consider donating plasma after you have recovered (either 14 days after a negative test or 28 days after symptoms have completely resolved) because your antibodies to this virus may be helpful to give to others with life-threatening infections. Please go to the website www.[email or web address] if you would like to consider volunteering for plasma donation.    Directions for you at home:  Wear a facemask You should wear a facemask that covers your nose and mouth when you are in the same room with other people and when you visit a healthcare provider. People who live with or visit you should also wear a facemask while they are in the same room with you.  Separate yourself from other people in your home As much as possible, you should stay in a different room from other people in your home. Also, you should use a separate bathroom, if available.  Avoid sharing household items You should not share dishes, drinking glasses, cups, eating utensils, towels, bedding, or other items with other people in your home. After using these items, you should wash them thoroughly with soap and water.  Cover your coughs and sneezes Cover your mouth and nose with a tissue when you cough or sneeze, or you can cough or sneeze into your sleeve. Throw used tissues in a lined trash can, and immediately wash your hands with soap and water for at least 20 seconds or use an alcohol-based hand  rub.  Wash your [employer] your hands often and thoroughly with soap and water for at least 20 seconds. You can use an alcohol-based hand sanitizer if soap and water are not available and if your hands are not visibly dirty. Avoid touching your eyes, nose, and mouth with unwashed hands.  Directions for those who live with, or provide care at home for you:  Limit the number of people who have contact with the patient If possible, have only one caregiver for the patient. Other household members should stay in another home or place of residence. If this is not possible, they should stay in another room, or be separated from the patient as much as possible. Use a separate bathroom, if available. Restrict visitors who do not have an essential need to be in the home.  Ensure good ventilation Make sure that shared spaces in the home have good air flow, such as from an air conditioner or an opened window, weather permitting.  Wash your hands often Wash your hands often and thoroughly with soap and water for at least 20 seconds. You can use an alcohol based hand sanitizer if soap and water are not available and if your hands are not visibly dirty. Avoid touching your eyes, nose, and mouth with unwashed hands. Use disposable paper towels to dry your hands. If not available, use dedicated cloth towels and replace them when they become wet.  Wear a facemask and gloves Wear a disposable facemask at all times in the room and gloves when you touch or have contact with the patient's blood, body fluids, and/or  secretions or excretions, such as sweat, saliva, sputum, nasal mucus, vomit, urine, or feces.  Ensure the mask fits over your nose and mouth tightly, and do not touch it during use. Throw out disposable facemasks and gloves after using them. Do not reuse. Wash your hands immediately after removing your facemask and gloves. If your personal clothing becomes contaminated, carefully remove clothing and  launder. Wash your hands after handling contaminated clothing. Place all used disposable facemasks, gloves, and other waste in a lined container before disposing them with other household waste. Remove gloves and wash your hands immediately after handling these items.  Do not share dishes, glasses, or other household items with the patient Avoid sharing household items. You should not share dishes, drinking glasses, cups, eating utensils, towels, bedding, or other items with a patient who is confirmed to have, or being evaluated for, COVID-19 infection. After the person uses these items, you should wash them thoroughly with soap and water.  Wash laundry thoroughly Immediately remove and wash clothes or bedding that have blood, body fluids, and/or secretions or excretions, such as sweat, saliva, sputum, nasal mucus, vomit, urine, or feces, on them. Wear gloves when handling laundry from the patient. Read and follow directions on labels of laundry or clothing items and detergent. In general, wash and dry with the warmest temperatures recommended on the label.  Clean all areas the individual has used often Clean all touchable surfaces, such as counters, tabletops, doorknobs, bathroom fixtures, toilets, phones, keyboards, tablets, and bedside tables, every day. Also, clean any surfaces that may have blood, body fluids, and/or secretions or excretions on them. Wear gloves when cleaning surfaces the patient has come in contact with. Use a diluted bleach solution (e.g., dilute bleach with 1 part bleach and 10 parts water) or a household disinfectant with a label that says EPA-registered for coronaviruses. To make a bleach solution at home, add 1 tablespoon of bleach to 1 quart (4 cups) of water. For a larger supply, add  cup of bleach to 1 gallon (16 cups) of water. Read labels of cleaning products and follow recommendations provided on product labels. Labels contain instructions for safe and effective  use of the cleaning product including precautions you should take when applying the product, such as wearing gloves or eye protection and making sure you have good ventilation during use of the product. Remove gloves and wash hands immediately after cleaning.  Monitor yourself for signs and symptoms of illness Caregivers and household members are considered close contacts, should monitor their health, and will be asked to limit movement outside of the home to the extent possible. Follow the monitoring steps for close contacts listed on the symptom monitoring form.  If you have additional questions, contact your local health department or call the epidemiologist on call at [phone] (available 24/7). This guidance is subject to change. For the most up-to-date guidance from [facility], please refer to their website: TripMetro.hu     Allergies as of 06/18/2019      Reactions   Haldol [haloperidol Lactate] Anaphylaxis, Swelling   Tylenol [acetaminophen] Other (See Comments)   Patient chooses to limit his intake of this   Risperdal [risperidone] Anxiety, Other (See Comments)   Dyskinesia, also      Medication List    STOP taking these medications   benztropine 1 MG tablet Commonly known as: COGENTIN   cephALEXin 500 MG capsule Commonly known as: KEFLEX   gabapentin 400 MG capsule Commonly known as: NEURONTIN   ibuprofen 600  MG tablet Commonly known as: ADVIL   NIACIN FLUSH FREE PO   nicotine polacrilex 2 MG gum Commonly known as: NICORETTE   oxyCODONE 5 MG immediate release tablet Commonly known as: Oxy IR/ROXICODONE   PARoxetine 10 MG tablet Commonly known as: PAXIL   QUEtiapine 200 MG tablet Commonly known as: SEROQUEL     TAKE these medications   ARIPiprazole 5 MG tablet Commonly known as: ABILIFY Take 0.5 tablets (2.5 mg total) by mouth daily. Start taking on: June 19, 2019   cloNIDine 0.1 MG  tablet Commonly known as: CATAPRES Take 1 tablet (0.1 mg total) by mouth 2 (two) times daily.   EPINEPHrine 0.3 mg/0.3 mL Soaj injection Commonly known as: EPI-PEN Inject 0.3 mLs (0.3 mg total) into the muscle once.   FLUoxetine 10 MG capsule Commonly known as: PROZAC Take 1 capsule (10 mg total) by mouth daily. Start taking on: June 19, 2019   hydrOXYzine 25 MG tablet Commonly known as: ATARAX/VISTARIL Take 1 tablet (25 mg total) by mouth every 6 (six) hours as needed for anxiety.      Follow-up [facility].   Contact information: [place] [phone]         Allergies  Allergen Reactions  . Haldol [Haloperidol Lactate] Anaphylaxis and Swelling  . Tylenol [Acetaminophen] Other (See Comments)    Patient chooses to limit his intake of this  . Risperdal [Risperidone] Anxiety and Other (See Comments)    Dyskinesia, also      Consultations:  Psychiatry  Procedures/Studies: Dg Chest Port 1 View  Result Date: 06/12/2019 CLINICAL DATA:  Pneumonia EXAM: PORTABLE CHEST 1 VIEW COMPARISON:  05/24/2019 FINDINGS: There is some mild prominent interstitial lung markings bilaterally. This is overall similar to prior study. There is no pneumothorax. No large pleural effusion. The heart size is stable. There is no acute osseous abnormality. IMPRESSION: Prominent interstitial lung markings bilaterally, similar to prior study. Findings can be seen with an atypical infectious process such as viral pneumonia. Electronically Signed   By: Constance Holster M.D.   On: 06/12/2019 14:50     Subjective: No cough, shortness of breath. Eating well. Doesn't believe the covid test was accurate. Does not report SI.   Discharge Exam: Vitals:   06/18/19 0318 06/18/19 0733  BP: 124/77 113/78  Pulse: 72 72  Resp:  14  Temp: 98 F (36.7 C) 97.9 F (36.6 C)  SpO2: 96% 99%   General: Pt is alert, awake, not in  acute distress Cardiovascular: RRR, S1/S2 +, no rubs, no gallops Respiratory: CTA bilaterally, no wheezing, no rhonchi Abdominal: Soft, NT, ND, bowel sounds + Extremities: No edema, no cyanosis  Labs: BNP (last 3 results) No results for input(s): BNP in the last 8760 hours. Basic Metabolic Panel: Recent Labs  Lab 06/12/19 0829 06/13/19 0519 06/17/19 0458  NA 136 136 133*  K 3.7 3.6 5.1  CL 99 103 98  CO2 23 24 23   GLUCOSE 105* 85 109*  BUN 10 11 15   CREATININE 1.61* 1.11 1.27*  CALCIUM 9.7 8.3* 9.4  MG  --   --  2.2   Liver Function Tests: Recent Labs  Lab 06/12/19 0829 06/13/19 0519  AST 54* 38  ALT 80* 53*  ALKPHOS 97 70  BILITOT 1.2 0.6  PROT 8.3* 6.2*  ALBUMIN 4.1 2.9*   No results for input(s): LIPASE, AMYLASE in the last 168 hours. No results for input(s): AMMONIA in  the last 168 hours. CBC: Recent Labs  Lab 06/12/19 0829 06/13/19 0519 06/17/19 0458  WBC 9.5 4.6 8.7  NEUTROABS 6.4  --  6.1  HGB 15.4 12.8* 15.5  HCT 46.2 37.8* 46.7  MCV 92.6 92.9 92.7  PLT 371 230 283   Cardiac Enzymes: No results for input(s): CKTOTAL, CKMB, CKMBINDEX, TROPONINI in the last 168 hours. BNP: Invalid input(s): POCBNP CBG: Recent Labs  Lab 06/12/19 2040  GLUCAP 81   D-Dimer No results for input(s): DDIMER in the last 72 hours. Hgb A1c No results for input(s): HGBA1C in the last 72 hours. Lipid Profile No results for input(s): CHOL, HDL, LDLCALC, TRIG, CHOLHDL, LDLDIRECT in the last 72 hours. Thyroid function studies No results for input(s): TSH, T4TOTAL, T3FREE, THYROIDAB in the last 72 hours.  Invalid input(s): FREET3 Anemia work up No results for input(s): VITAMINB12, FOLATE, FERRITIN, TIBC, IRON, RETICCTPCT in the last 72 hours. Urinalysis    Component Value Date/Time   COLORURINE Yellow 08/07/2014 1236   APPEARANCEUR Hazy 08/07/2014 1236   LABSPEC 1.025 08/07/2014 1236   PHURINE 5.0 08/07/2014 1236   GLUCOSEU Negative 08/07/2014 1236   HGBUR  Negative 08/07/2014 1236   BILIRUBINUR Negative 08/07/2014 1236   KETONESUR Negative 08/07/2014 1236   PROTEINUR Negative 08/07/2014 1236   NITRITE Negative 08/07/2014 1236   LEUKOCYTESUR Negative 08/07/2014 1236    Microbiology Recent Results (from the past 240 hour(s))  SARS Coronavirus 2 [facility] order, Performed in [facility] hospital lab) Nasopharyngeal Nasopharyngeal Swab     Status: Abnormal   Collection Time: 06/12/19  9:00 AM   Specimen: Nasopharyngeal Swab  Result Value Ref Range Status   SARS Coronavirus 2 POSITIVE (A) NEGATIVE Final    Comment: RESULT CALLED TO, READ BACK BY AND VERIFIED WITH: Donato Schultz RN 12:25 06/12/19 ([email or web address]) (NOTE) If result is NEGATIVE SARS-CoV-2 target nucleic acids are NOT DETECTED. The SARS-CoV-2 RNA is generally detectable in upper and lower  respiratory specimens during the acute phase of infection. The lowest  concentration of SARS-CoV-2 viral copies this assay can detect is 250  copies / mL. A negative result does not preclude SARS-CoV-2 infection  and should not be used as the sole basis for treatment or other  patient management decisions.  A negative result may occur with  improper specimen collection / handling, submission of specimen other  than nasopharyngeal swab, presence of viral mutation(s) within the  areas targeted by this assay, and inadequate number of viral copies  (<250 copies / mL). A negative result must be combined with clinical  observations, patient history, and epidemiological information. If result is POSITIVE SARS-CoV-2 target nucleic acids are DETECTED.  The SARS-CoV-2 RNA is generally detectable in upper and lower  respiratory specimens during the acute phase of infection.  Positive  results are indicative of active infection with SARS-CoV-2.  Clinical  correlation with patient history and other diagnostic information is  necessary to determine patient infection status.  Positive results do  not rule out  bacterial infection or co-infection with other viruses. If result is PRESUMPTIVE POSTIVE SARS-CoV-2 nucleic acids MAY BE PRESENT.   A presumptive positive result was obtained on the submitted specimen  and confirmed on repeat testing.  While 2019 novel coronavirus  (SARS-CoV-2) nucleic acids may be present in the submitted sample  additional confirmatory testing may be necessary for epidemiological  and / or clinical management purposes  to differentiate between  SARS-CoV-2 and other Sarbecovirus currently known to infect humans.  If clinically indicated  additional testing with an alternate test  methodology ([id number]) i s advised. The SARS-CoV-2 RNA is generally  detectable in upper and lower respiratory specimens during the acute  phase of infection. The expected result is Negative. Fact Sheet for Patients:  BoilerBrush.com.cyhttps://www.fda.gov/media/136312502-191-5533/download Fact Sheet for Healthcare Providers: https://pope.com/https://www.fda.gov/media/136313/download This test is not yet approved or cleared by the [place] FDA and has been authorized for detection and/or diagnosis of SARS-CoV-2 by FDA under an Emergency Use Authorization (EUA).  This EUA will remain in effect (meaning this test can be used) for the duration of the COVID-19 declaration under Section 564(b)(1) of the Act, 21 U.S.C. section 360bbb-3(b)(1), unless the authorization is terminated or revoked sooner. Performed at [facility], [address] [place], [place] [address]   Blood Culture (routine x 2)     Status: None   Collection Time: 06/12/19  3:35 PM   Specimen: BLOOD LEFT ARM  Result Value Ref Range Status   Specimen Description BLOOD LEFT ARM  Final   Special Requests   Final    BOTTLES DRAWN AEROBIC AND ANAEROBIC Blood Culture adequate volume   Culture   Final    NO GROWTH 5 DAYS Performed at [facility], [address] [place], [place] [address]    Report Status 06/17/2019 FINAL  Final  Blood Culture (routine x 2)      Status: None   Collection Time: 06/12/19  3:51 PM   Specimen: BLOOD RIGHT ARM  Result Value Ref Range Status   Specimen Description BLOOD RIGHT ARM  Final   Special Requests   Final    BOTTLES DRAWN AEROBIC AND ANAEROBIC Blood Culture adequate volume   Culture   Final    NO GROWTH 5 DAYS Performed at [facility], [address] [place], [place] [address]    Report Status 06/17/2019 FINAL  Final    Time coordinating discharge: Approximately 40 minutes  Tyrone Nineyan B Lui Bellis, MD  [facility] 06/18/2019, 9:52 AM

## 2019-06-18 NOTE — Discharge Instructions (Signed)

## 2019-06-18 NOTE — Progress Notes (Signed)
   06/18/19 0700  Provider Notification  Provider Name/Title Loralee Pacas  Date Provider Notified 06/18/19  Time Provider Notified [phone]  Notification Type Page  Notification Reason  (Pt reports feeling increased anxious)  Response See new orders  Date of Provider Response 06/18/19  Time of Provider Response 0700

## 2019-06-18 NOTE — Progress Notes (Signed)
Mother contacted and made aware that patient was not allowed visitors or anyone to come to hotel where he will be per the program requirements and that they will provide his food, etc.  Mother appreciative of call.

## 2019-06-18 NOTE — Progress Notes (Signed)
Mother made aware earlier of patient discharge and where he would be going and given address.

## 2019-06-18 NOTE — Progress Notes (Signed)
IV removed with cannula intact.  Pt discharge instructions reviewed and medications reviewed.  Questions answered.  No s/s of distress at this time.

## 2019-06-18 NOTE — TOC Transition Note (Signed)
Transition of Care [facility]) - CM/SW Discharge Note   Patient Details  Name: John [name] MRN: [id number] Date of Birth: May 07, 1981  Transition of Care [facility]) CM/SW Contact:  Weston Anna, LCSW Phone Number: [phone] 06/18/2019, 1:54 PM   Clinical Narrative:     Patient is set to discharge to hotel provided by [facility] [address] [address]) once all medications have been delivered from [facility] facilitated referral.   Patient aware he must stay at [facility] for 14 days and then has confirmed bed at [facility] in [place]. Patient agreeable to stay at [facility] once leaving [place].   [facility] should delivery medications around 4PM this afternoon and transportation though [facility] has been arranged for 6PM.   All food/ needs will be provided to patient at hotel via [facility]  Final next level of care: Homeless Shelter(Patient going to hotel first then homeless shelter) Barriers to Discharge: No Barriers Identified   Patient Goals and CMS Choice        Discharge Placement                Patient to be transferred to facility by: [facility]   Patient and family notified of of transfer: 06/18/19  Discharge Plan and Services   Discharge Planning Services: CM Consult, [place] Program            DME Arranged: N/A         HH Arranged: NA          Social Determinants of Health (SDOH) Interventions     Readmission Risk Interventions No flowsheet data found.

## 2019-06-18 NOTE — Progress Notes (Signed)
   Y/N yes COVID-19 TESTED?  DATE OF TEST______9/25___________  yes ISOLATION ORDER PLACED  yes INDEPENDENT OF ADL?  yes AMBULATORY?  no DIETARY RESTRICTIONS?  yes Contact Homeless Shelter to confirm patient is eligible to go there after hotel stay  Yes-waiting on delivery [facility]?  No- Epimenio Sarin CONTACT ZACK BROOKS CSW AD [phone] TO INITIATE REFERRAL [facility]CCESS WITH [place] [facility]  Yes ISOLATION RULES/FORM REVIEWED AND UNDERSTOOD BY THE PATIENT (SENT BY [facility])  No ISOLATION FORM RETURNED TO ZACK (SCANNED/EMAILED)  Yes HOTEL NAME AND ADDRESS PROVIDED BY PUBLIC HEALTH

## 2019-06-18 NOTE — TOC Transition Note (Signed)
Transition of Care [facility]) - CM/SW Discharge Note   Patient Details  Name: John [name] MRN: [id number] Date of Birth: 04/27/1981  Transition of Care [facility]) CM/SW Contact:  Ninfa Meeker, RN Phone Number:  [phone] (working remotely) 06/18/2019, 10:06 AM   Clinical Narrative:   Case manager entered [place] assistance for patient. Medications will be filled and delivered to [facility] later this afternoon via courier by [place]. SW has arranged for patient to go to homeless shelter, bedside RN notified patient must wait until meds received before discharge.    Final next level of care: Homeless Shelter Barriers to Discharge: Homeless with medical needs, Active Substance Use - Placement, Inadequate or no insurance   Patient Goals and CMS Choice        Discharge Placement                       Discharge Plan and Services   Discharge Planning Services: CM Consult, [place] [facility]                                 Social Determinants of Health (SDOH) Interventions     Readmission Risk Interventions No flowsheet data found.

## 2019-06-18 NOTE — Progress Notes (Signed)
Pt given paper scrubs and left ambulatory for home.  Walked out by this Therapist, sports and Candace Cruise, RN charge.  No s/s of distress.  Taken via transportation to go to [employer] for [employer]. Respirations even and unlabored.  Discharge instructions and medications given to patient.

## 2019-07-08 ENCOUNTER — Inpatient Hospital Stay: Payer: Self-pay

## 2019-07-23 ENCOUNTER — Inpatient Hospital Stay (HOSPITAL_COMMUNITY)
Admission: AD | Admit: 2019-07-23 | Discharge: 2019-07-27 | DRG: 897 | Disposition: A | Discharge status: Home / Self Care | Payer: No Typology Code available for payment source | Source: Intra-hospital | Attending: Psychiatry | Admitting: Psychiatry

## 2019-07-23 ENCOUNTER — Encounter (HOSPITAL_COMMUNITY): Payer: Self-pay | Admitting: *Deleted

## 2019-07-23 ENCOUNTER — Other Ambulatory Visit: Payer: Self-pay

## 2019-07-23 ENCOUNTER — Other Ambulatory Visit: Payer: Self-pay | Admitting: Family

## 2019-07-23 DIAGNOSIS — G4700 Insomnia, unspecified: Secondary | ICD-10-CM | POA: Diagnosis present

## 2019-07-23 DIAGNOSIS — K219 Gastro-esophageal reflux disease without esophagitis: Secondary | ICD-10-CM | POA: Diagnosis present

## 2019-07-23 DIAGNOSIS — F1920 Other psychoactive substance dependence, uncomplicated: Principal | ICD-10-CM

## 2019-07-23 DIAGNOSIS — F17210 Nicotine dependence, cigarettes, uncomplicated: Secondary | ICD-10-CM | POA: Diagnosis present

## 2019-07-23 DIAGNOSIS — F1910 Other psychoactive substance abuse, uncomplicated: Secondary | ICD-10-CM | POA: Diagnosis present

## 2019-07-23 DIAGNOSIS — Z590 Homelessness: Secondary | ICD-10-CM | POA: Diagnosis not present

## 2019-07-23 DIAGNOSIS — F419 Anxiety disorder, unspecified: Secondary | ICD-10-CM | POA: Diagnosis present

## 2019-07-23 DIAGNOSIS — F332 Major depressive disorder, recurrent severe without psychotic features: Secondary | ICD-10-CM | POA: Diagnosis present

## 2019-07-23 DIAGNOSIS — R45851 Suicidal ideations: Secondary | ICD-10-CM | POA: Diagnosis present

## 2019-07-23 DIAGNOSIS — F1120 Opioid dependence, uncomplicated: Secondary | ICD-10-CM | POA: Diagnosis present

## 2019-07-23 DIAGNOSIS — F19288 Other psychoactive substance dependence with other psychoactive substance-induced disorder: Secondary | ICD-10-CM | POA: Diagnosis not present

## 2019-07-23 DIAGNOSIS — F1520 Other stimulant dependence, uncomplicated: Secondary | ICD-10-CM | POA: Diagnosis not present

## 2019-07-23 HISTORY — DX: Anxiety disorder, unspecified: F41.9

## 2019-07-23 MED ORDER — MAGNESIUM HYDROXIDE 400 MG/5ML PO SUSP: 30.0000 mL | Freq: Every day | ORAL | Status: DC | PRN

## 2019-07-23 MED ORDER — NICOTINE 21 MG/24HR TD PT24
21.0000 mg | MEDICATED_PATCH | Freq: Every day | TRANSDERMAL | Status: DC
  Administered 2019-07-24 – 2019-07-26 (×2): 21 mg via TRANSDERMAL
  Filled 2019-07-23 (×8): qty 1

## 2019-07-23 MED ORDER — ARIPIPRAZOLE 10 MG PO TABS
10.0000 mg | ORAL_TABLET | Freq: Every day | ORAL | Status: DC
  Administered 2019-07-23 – 2019-07-26 (×4): 10 mg via ORAL
  Filled 2019-07-23 (×2): qty 1
  Filled 2019-07-23: qty 14
  Filled 2019-07-23 (×3): qty 1

## 2019-07-23 MED ORDER — HYDROXYZINE HCL 25 MG PO TABS: 25.0000 mg | ORAL_TABLET | Freq: Three times a day (TID) | ORAL | Status: DC | PRN

## 2019-07-23 MED ORDER — FLUOXETINE HCL 10 MG PO CAPS
10.0000 mg | ORAL_CAPSULE | Freq: Every day | ORAL | Status: DC
  Administered 2019-07-23 – 2019-07-26 (×4): 10 mg via ORAL
  Filled 2019-07-23 (×6): qty 1

## 2019-07-23 MED ORDER — ALUM & MAG HYDROXIDE-SIMETH 200-200-20 MG/5ML PO SUSP: 30.0000 mL | ORAL | Status: DC | PRN

## 2019-07-23 MED ORDER — TRAZODONE HCL 50 MG PO TABS
50.0000 mg | ORAL_TABLET | Freq: Every evening | ORAL | Status: DC | PRN
  Filled 2019-07-23: qty 1

## 2019-07-23 MED ORDER — TRAZODONE HCL 150 MG PO TABS
150.0000 mg | ORAL_TABLET | Freq: Every evening | ORAL | Status: DC | PRN
  Administered 2019-07-23 – 2019-07-26 (×4): 150 mg via ORAL
  Filled 2019-07-23: qty 7
  Filled 2019-07-23 (×4): qty 1

## 2019-07-23 MED ORDER — HYDROXYZINE HCL 25 MG PO TABS
25.0000 mg | ORAL_TABLET | Freq: Three times a day (TID) | ORAL | Status: DC | PRN
  Administered 2019-07-24 – 2019-07-26 (×6): 25 mg via ORAL
  Filled 2019-07-23 (×5): qty 1
  Filled 2019-07-23: qty 10
  Filled 2019-07-23: qty 1

## 2019-07-23 NOTE — Tx Team (Signed)
Initial Treatment Plan 07/23/2019 7:04 PM [name] OMA:[id number]    PATIENT STRESSORS: Financial difficulties Medication change or noncompliance Occupational concerns Substance abuse Traumatic event   PATIENT STRENGTHS: Ability for insight Average or above average intelligence Capable of independent living Communication skills General fund of knowledge Motivation for treatment/growth Supportive family/friends Work skills   PATIENT IDENTIFIED PROBLEMS: "suicidal"  "substance"  "anxiety"  "depression"               DISCHARGE CRITERIA:  Ability to meet basic life and health needs Adequate post-discharge living arrangements Improved stabilization in mood, thinking, and/or behavior Medical problems require only outpatient monitoring Motivation to continue treatment in a less acute level of care Need for constant or close observation no longer present Reduction of life-threatening or endangering symptoms to within safe limits Safe-care adequate arrangements made Verbal commitment to aftercare and medication compliance Withdrawal symptoms are absent or subacute and managed without 24-hour nursing intervention  PRELIMINARY DISCHARGE PLAN: Attend aftercare/continuing care group Attend PHP/IOP Attend 12-step recovery group Outpatient therapy Return to previous living arrangement Return to previous work or school arrangements  PATIENT/FAMILY INVOLVEMENT: This treatment plan has been presented to and reviewed with the patient, John [name]..  The patient and family have been given the opportunity to ask questions and make suggestions.  John [name] [place], RN 07/23/2019, 7:04 PM

## 2019-07-23 NOTE — Progress Notes (Signed)
Psychoeducational Group Note  Date:  07/23/2019 Time:  2113  Group Topic/Focus:  Wrap-Up Group:   The focus of this group is to help patients review their daily goal of treatment and discuss progress on daily workbooks.  Participation Level: Did Not Attend  Participation Quality:  Not Applicable  Affect:  Not Applicable  Cognitive:  Not Applicable  Insight:  Not Applicable  Engagement in Group: Not Applicable  Additional Comments:  The patient did not attend group this evening.   Archie Balboa S 07/23/2019, 9:13 PM

## 2019-07-23 NOTE — Progress Notes (Signed)
Patient is 38 yrs old.  Bipolar, schizophrenia,a PTSD, IV drugs, anxiety, depression.  Came to ED on 07/21/2019 with parole officer.  Suicidal thoughts to OD on pills.  HI to his mother.  Frequent patient at [facility].  Tattoos on L/R arms, pimples on back, R abdomen, gallbladder surgery yrs ago.  Sore on back of head size of quarter, scab on R ear.  Stated he has a nervous tick.  SI today, contracts for safety.  Just does not want to live anymore.  Don't care if I live or die anymore.  Denied HI.  Does see shadows at times.  Also hears voices that he is worthless.  Rated anxiety 10, depression 7, hopeless 8.  Smokes 1.5 packs cigarettes daily.  Alcohol, one case beer daily, started at age 11 yrs old.  THC, lsst use 3-4 weeks ago, one blunt daily.  Heroin one bundle daily since 37 yrs old, or fentanyl, last 2 days used.  Cocaine awhile, over one yr ago.  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  Has stopped seeing his kids, started drugs again.  Unemployment $214 after child support.  Children live in [place] with mom.  Attempted SI by OD on heroin and clonidine once. Fall risk information given and discussed, high fall risk. Patient given food/drink, oriented to unit.
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
## 2019-07-23 NOTE — Plan of Care (Signed)
Nurse discussed anxiety, depression and coping skills with patient.  

## 2019-07-24 DIAGNOSIS — F332 Major depressive disorder, recurrent severe without psychotic features: Secondary | ICD-10-CM

## 2019-07-24 DIAGNOSIS — F19288 Other psychoactive substance dependence with other psychoactive substance-induced disorder: Secondary | ICD-10-CM

## 2019-07-24 DIAGNOSIS — R45851 Suicidal ideations: Secondary | ICD-10-CM

## 2019-07-24 LAB — HEPATIC FUNCTION PANEL
ALT: 36 U/L (ref 0–44)
AST: 22 U/L (ref 15–41)
Albumin: 3.9 g/dL (ref 3.5–5.0)
Alkaline Phosphatase: 102 U/L (ref 38–126)
Bilirubin, Direct: 0.1 mg/dL (ref 0.0–0.2)
Indirect Bilirubin: 0.6 mg/dL (ref 0.3–0.9)
Total Bilirubin: 0.7 mg/dL (ref 0.3–1.2)
Total Protein: 7.3 g/dL (ref 6.5–8.1)

## 2019-07-24 LAB — URINALYSIS, COMPLETE (UACMP) WITH MICROSCOPIC
Bacteria, UA: NONE SEEN
Bilirubin Urine: NEGATIVE
Glucose, UA: NEGATIVE mg/dL
Hgb urine dipstick: NEGATIVE
Ketones, ur: NEGATIVE mg/dL
Leukocytes,Ua: NEGATIVE
Nitrite: NEGATIVE
Protein, ur: NEGATIVE mg/dL
Specific Gravity, Urine: 1.017 (ref 1.005–1.030)
pH: 6 (ref 5.0–8.0)

## 2019-07-24 LAB — RAPID URINE DRUG SCREEN, HOSP PERFORMED
Amphetamines: POSITIVE — AB
Barbiturates: NOT DETECTED
Benzodiazepines: POSITIVE — AB
Cocaine: NOT DETECTED
Opiates: NOT DETECTED
Tetrahydrocannabinol: NOT DETECTED

## 2019-07-24 LAB — COMPREHENSIVE METABOLIC PANEL
ALT: 35 U/L (ref 0–44)
AST: 23 U/L (ref 15–41)
Albumin: 3.8 g/dL (ref 3.5–5.0)
Alkaline Phosphatase: 100 U/L (ref 38–126)
Anion gap: 10 (ref 5–15)
BUN: 13 mg/dL (ref 6–20)
CO2: 24 mmol/L (ref 22–32)
Calcium: 8.9 mg/dL (ref 8.9–10.3)
Chloride: 104 mmol/L (ref 98–111)
Creatinine, Ser: 0.91 mg/dL (ref 0.61–1.24)
GFR calc Af Amer: >60 mL/min (ref >60)
GFR calc non Af Amer: >60 mL/min (ref >60)
Glucose, Bld: 93 mg/dL (ref 70–99)
Potassium: 4.2 mmol/L (ref 3.5–5.1)
Sodium: 138 mmol/L (ref 135–145)
Total Bilirubin: 0.6 mg/dL (ref 0.3–1.2)
Total Protein: 7.2 g/dL (ref 6.5–8.1)

## 2019-07-24 LAB — ETHANOL: Alcohol, Ethyl (B): <10 mg/dL (ref <10)

## 2019-07-24 LAB — LIPID PANEL
Cholesterol: 150 mg/dL (ref 0–200)
HDL: 32 mg/dL — ABNORMAL LOW (ref >40)
LDL Cholesterol: 102 mg/dL — ABNORMAL HIGH (ref 0–99)
Total CHOL/HDL Ratio: 4.7 RATIO
Triglycerides: 78 mg/dL (ref <150)
VLDL: 16 mg/dL (ref 0–40)

## 2019-07-24 LAB — HEMOGLOBIN A1C
Hgb A1c MFr Bld: 5.5 % (ref 4.8–5.6)
Mean Plasma Glucose: 111.15 mg/dL

## 2019-07-24 LAB — MAGNESIUM: Magnesium: 2.1 mg/dL (ref 1.7–2.4)

## 2019-07-24 LAB — TSH: TSH: 0.843 u[IU]/mL (ref 0.350–4.500)

## 2019-07-24 MED ORDER — METHOCARBAMOL 750 MG PO TABS
750.0000 mg | ORAL_TABLET | Freq: Four times a day (QID) | ORAL | Status: DC | PRN
  Administered 2019-07-24 – 2019-07-26 (×5): 750 mg via ORAL
  Filled 2019-07-24 (×5): qty 1

## 2019-07-24 MED ORDER — ONDANSETRON HCL 4 MG PO TABS
4.0000 mg | ORAL_TABLET | Freq: Once | ORAL | Status: AC
  Administered 2019-07-24: 4 mg via ORAL
  Filled 2019-07-24: qty 1

## 2019-07-24 MED ORDER — PANTOPRAZOLE SODIUM 40 MG PO TBEC
40.0000 mg | DELAYED_RELEASE_TABLET | Freq: Every day | ORAL | Status: DC
  Administered 2019-07-24 – 2019-07-27 (×4): 40 mg via ORAL
  Filled 2019-07-24 (×2): qty 1
  Filled 2019-07-24: qty 7
  Filled 2019-07-24 (×4): qty 1

## 2019-07-24 MED ORDER — IBUPROFEN 400 MG PO TABS
400.0000 mg | ORAL_TABLET | Freq: Four times a day (QID) | ORAL | Status: DC | PRN
  Administered 2019-07-26 – 2019-07-27 (×3): 400 mg via ORAL
  Filled 2019-07-24 (×3): qty 1

## 2019-07-24 MED ORDER — BACITRACIN-NEOMYCIN-POLYMYXIN OINTMENT TUBE
TOPICAL_OINTMENT | Freq: Two times a day (BID) | CUTANEOUS | Status: DC
  Administered 2019-07-24: 18:00:00 via TOPICAL
  Administered 2019-07-25: 1 via TOPICAL
  Administered 2019-07-25 – 2019-07-26 (×2): via TOPICAL
  Administered 2019-07-26: 1 via TOPICAL
  Administered 2019-07-27: 09:00:00 via TOPICAL
  Filled 2019-07-24: qty 14.17

## 2019-07-24 NOTE — BHH Group Notes (Signed)
Type of Therapy and Topic: Group Therapy: Trust and Honesty  Participation Level: Active    Description of Group:  In this group patients will be asked to explore the value of being honest. Patients will be guided to discuss their thoughts, feelings, and behaviors related to honesty and trusting in others. Patients will process together how trust and honesty relate to forming relationships with peers, family members, and self. Each patient will be challenged to identify and express feelings of being vulnerable. Patients will discuss reasons why people are dishonest and identify alternative outcomes if one was truthful (to self or others). This group will be process-oriented, with patients participating in exploration of their own experiences, giving and receiving support, and processing challenge from other group members.  Therapeutic Goals: 1. Patient will identify why honesty is important to relationships and how honesty overall affects relationships. 2. Patient will identify a situation where they lied or were lied too and the feelings, thought process, and behaviors surrounding the situation 3. Patient will identify the meaning of being vulnerable, how that feels, and how that correlates to being honest with self and others. 4. Patient will identify situations where they could have told the truth, but instead lied and explain reasons of dishonesty.  Summary of Patient Progress:  John [name] was engaged and participated throughout the group session. John [name] reports that honesty and vulnerability causes people to get hurt. John [name] reports that lying has caused him to get into a lot of trouble, especially with law enforcement. He reports because he was not honest with his probation officers regarding his struggle with substance use, he has been placed on house arrest for multiple violations.      Therapeutic Modalities:  Cognitive Behavioral Therapy Solution Focused Therapy Motivational  Interviewing Brief Therapy

## 2019-07-24 NOTE — Progress Notes (Signed)
   07/24/19 0000  Psych Admission Type (Psych Patients Only)  Admission Status Voluntary  Psychosocial Assessment  Patient Complaints None  Eye Contact Brief  Facial Expression Anxious;Flat  Affect Anxious;Appropriate to circumstance;Depressed  Speech Logical/coherent  Interaction Cautious;Forwards little  Motor Activity Slow  Appearance/Hygiene Disheveled;In scrubs  Behavior Characteristics Cooperative;Anxious;Guarded  Mood Depressed;Anxious;Preoccupied  Thought Process  Coherency WDL  Content WDL  Delusions None reported or observed  Perception WDL  Hallucination None reported or observed  Judgment Impaired  Confusion None  Danger to Self  Current suicidal ideation? Denies  Danger to Others  Danger to Others None reported or observed   Pt. Spent majority of the night in his room sleeping.  Pt. Denies any complaints/concerns.
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
## 2019-07-24 NOTE — Tx Team (Signed)
Interdisciplinary Treatment and Diagnostic Plan Update  07/24/2019 Time of Session: 9:30am John [name] MRN: [id number]  Principal Diagnosis: <principal problem not specified>  Secondary Diagnoses: Active Problems:   Polysubstance abuse (HCC)   MDD (major depressive disorder), recurrent episode, severe (HCC)   Current Medications:  Current Facility-Administered Medications  Medication Dose Route Frequency Provider Last Rate Last Dose  . alum & mag hydroxide-simeth (MAALOX/MYLANTA) 200-200-20 MG/5ML suspension 30 mL  30 mL Oral Q4H PRN Sharma Covert, MD      . ARIPiprazole (ABILIFY) tablet 10 mg  10 mg Oral QHS Sharma Covert, MD   10 mg at 07/23/19 2233  . FLUoxetine (PROZAC) capsule 10 mg  10 mg Oral Daily Sharma Covert, MD   10 mg at 07/24/19 3546  . hydrOXYzine (ATARAX/VISTARIL) tablet 25 mg  25 mg Oral TID PRN Sharma Covert, MD      . magnesium hydroxide (MILK OF MAGNESIA) suspension 30 mL  30 mL Oral Daily PRN Sharma Covert, MD      . nicotine (NICODERM CQ - dosed in mg/24 hours) patch 21 mg  21 mg Transdermal Daily Sharma Covert, MD   21 mg at 07/24/19 0921  . traZODone (DESYREL) tablet 150 mg  150 mg Oral QHS PRN Suella Broad, FNP   150 mg at 07/23/19 2234   PTA Medications: Medications Prior to Admission  Medication Sig Dispense Refill Last Dose  . ARIPiprazole (ABILIFY) 5 MG tablet Take 0.5 tablets (2.5 mg total) by mouth daily. 15 tablet 0 Past Week at Unknown time  . FLUoxetine (PROZAC) 20 MG capsule Take 20 mg by mouth daily.   Past Week at Unknown time    Patient Stressors: Financial difficulties Medication change or noncompliance Occupational concerns Substance abuse Traumatic event  Patient Strengths: Ability for insight Average or above average intelligence Capable of independent living Curator fund of knowledge Motivation for treatment/growth Supportive family/friends Work skills  Treatment  Modalities: Medication Management, Group therapy, Case management,  1 to 1 session with clinician, Psychoeducation, Recreational therapy.   Physician Treatment Plan for Primary Diagnosis: <principal problem not specified> Long Term Goal(s):     Short Term Goals:    Medication Management: Evaluate patient's response, side effects, and tolerance of medication regimen.  Therapeutic Interventions: 1 to 1 sessions, Unit Group sessions and Medication administration.  Evaluation of Outcomes: Not Met  Physician Treatment Plan for Secondary Diagnosis: Active Problems:   Polysubstance abuse ([place])   MDD (major depressive disorder), recurrent episode, severe ([place])  Long Term Goal(s):     Short Term Goals:       Medication Management: Evaluate patient's response, side effects, and tolerance of medication regimen.  Therapeutic Interventions: 1 to 1 sessions, Unit Group sessions and Medication administration.  Evaluation of Outcomes: Not Met   RN Treatment Plan for Primary Diagnosis: <principal problem not specified> Long Term Goal(s): Knowledge of disease and therapeutic regimen to maintain health will improve  Short Term Goals: Ability to participate in decision making will improve, Ability to verbalize feelings will improve, Ability to disclose and discuss suicidal ideas, Ability to identify and develop effective coping behaviors will improve and Compliance with prescribed medications will improve  Medication Management: RN will administer medications as ordered by provider, will assess and evaluate patient's response and provide education to patient for prescribed medication. RN will report any adverse and/or side effects to prescribing provider.  Therapeutic Interventions: 1 on 1 counseling sessions, Psychoeducation, Medication administration, Evaluate responses  to treatment, Monitor vital signs and CBGs as ordered, Perform/monitor CIWA, COWS, AIMS and Fall Risk screenings as ordered,  Perform wound care treatments as ordered.  Evaluation of Outcomes: Not Met   LCSW Treatment Plan for Primary Diagnosis: <principal problem not specified> Long Term Goal(s): Safe transition to appropriate next level of care at discharge, Engage patient in therapeutic group addressing interpersonal concerns.  Short Term Goals: Engage patient in aftercare planning with referrals and resources  Therapeutic Interventions: Assess for all discharge needs, 1 to 1 time with Social worker, Explore available resources and support systems, Assess for adequacy in community support network, Educate family and significant other(s) on suicide prevention, Complete Psychosocial Assessment, Interpersonal group therapy.  Evaluation of Outcomes: Not Met   Progress in Treatment: Attending groups: No. Participating in groups: No. Taking medication as prescribed: Yes. Toleration medication: Yes. Family/Significant other contact made: No, will contact:  if patient consents to collateral contacts Patient understands diagnosis: Yes. Discussing patient identified problems/goals with staff: Yes. Medical problems stabilized or resolved: Yes. Denies suicidal/homicidal ideation: Yes. Issues/concerns per patient self-inventory: No. Other:   New problem(s) identified: None   New Short Term/Long Term Goal(s): medication stabilization, elimination of SI thoughts, development of comprehensive mental wellness plan.    Patient Goals:    Discharge Plan or Barriers: Patient recently admitted. CSW will continue to follow and assess for appropriate referrals and possible discharge planning.    Reason for Continuation of Hospitalization: Depression Medication stabilization Suicidal ideation  Estimated Length of Stay: 3-5 days   Attendees: Patient: 07/24/2019 9:30 AM  Physician: Dr. Myles Lipps, MD 07/24/2019 9:30 AM  Nursing: Jan.W, RN 07/24/2019 9:30 AM  RN Care Manager: 07/24/2019 9:30 AM  Social Worker: Radonna Ricker, LCSW 07/24/2019 9:30 AM  Recreational Therapist:  07/24/2019 9:30 AM  Other:  07/24/2019 9:30 AM  Other:  07/24/2019 9:30 AM  Other: 07/24/2019 9:30 AM    Scribe for Treatment Team: Marylee Floras, [place] 07/24/2019 9:30 AM

## 2019-07-24 NOTE — [facility] Suicide Risk Assessment (Signed)
[facility] INPATIENT:  Family/Significant Other Suicide Prevention Education  Suicide Prevention Education:  Patient Refusal for Family/Significant Other Suicide Prevention Education: The patient John [name] has refused to provide written consent for family/significant other to be provided Family/Significant Other Suicide Prevention Education during admission and/or prior to discharge.  Physician notified.  SPE completed with patient, as patient refused to consent to family contact. SPI pamphlet provided to pt and pt was encouraged to share information with support network, ask questions, and talk about any concerns relating to SPE. Patient denies access to guns/firearms and verbalized understanding of information provided. Mobile Crisis information also provided to patient.    Marylee Floras 07/24/2019, 10:13 AM

## 2019-07-24 NOTE — Progress Notes (Signed)
   07/24/19 1500  Psych Admission Type (Psych Patients Only)  Admission Status Voluntary  Psychosocial Assessment  Patient Complaints Substance abuse  Eye Contact Brief  Facial Expression Anxious  Affect Anxious  Speech Logical/coherent  Interaction Assertive  Motor Activity Other (Comment) (WDL)  Appearance/Hygiene Disheveled  Behavior Characteristics Restless;Fidgety  Mood Depressed;Anxious  Thought Process  Coherency WDL  Content WDL  Delusions None reported or observed  Perception WDL  Hallucination None reported or observed  Judgment Impaired  Confusion None  Danger to Self  Current suicidal ideation? Denies  Danger to Others  Danger to Others None reported or observed   Pt is fidgity and agitated this afternoon. Pt has been asking for medications and has been given all prn medications at this time. Pt has a scab on the back of his head that has been bleeding  on his pillow case. It has stopped bleeding and neosporin has been ordered. Safety maintained on the unit,

## 2019-07-24 NOTE — Progress Notes (Signed)
Recreation Therapy Notes  Date:  11.6.20 Time: 0930 Location: [address] Dayroom  Group Topic: Stress Management  Goal Area(s) Addresses:  Patient will identify positive stress management techniques. Patient will identify benefits of using stress management post d/c.  Intervention: Stress Management  Activity :  Meditation.  LRT played a meditation that focused on letting go.  Patients were to listen to the meditation as it played in order to engage in the activity.  Education:  Stress Management, Discharge Planning.   Education Outcome: Acknowledges Education  Clinical Observations/Feedback:  Pt did not attend group.    Victorino Sparrow, LRT/CTRS    Victorino Sparrow A 07/24/2019 11:03 AM

## 2019-07-24 NOTE — [facility] Suicide Risk Assessment (Signed)
[facility] Admission Suicide Risk Assessment   Nursing information obtained from:  Patient Demographic factors:  Male, Unemployed, Caucasian, Low socioeconomic status Current Mental Status:  Suicidal ideation indicated by patient Loss Factors:  Decrease in vocational status, Loss of significant relationship, Financial problems / change in socioeconomic status Historical Factors:  Prior suicide attempts Risk Reduction Factors:  Responsible for children under 32 years of age, Sense of responsibility to family  Total Time spent with patient: 30 minutes Principal Problem: <principal problem not specified> Diagnosis:  Active Problems:   Polysubstance abuse (HCC)   MDD (major depressive disorder), recurrent episode, severe (HCC)  Subjective Data: Patient is seen and examined.  Patient is a 38 year old male with a past psychiatric history significant for methamphetamine dependence as well as opiate dependence who presented to the [facility] emergency department on 07/23/2019 with suicidal ideation.  The patient stated that recently he had been doing very poorly.  He had been in jail approximately 12 months ago, and had remained sober of substances for 10 months afterwards.  He stated that he has daughters from a previous relationship.  Apparently the boyfriend of the mother of the children has been attempting or doing inappropriate things with his daughters, and that upset him greatly.  The mother of the children was not allowing him to see the children.  This led him after 10 months of sobriety to relapse, and start going downhill.  He is on probation for substance related issues.  He initially self presented to a [facility] substance rehabilitation program, but left after 5 days.  He was given clonidine as part of his opiate withdrawal at that time, but several days afterwards overdosed on clonidine and then presented to the [facility].  He was admitted to psychiatry there and was kept  for several days.  He was discharged on 07/16/2019.  He was discharged on Abilify and fluoxetine.  He stated he has a history of bipolar disorder, and he feels as though the bipolar disorder being untreated leads to worsening substance abuse issues.  He stated he has been using an excessive amount of amphetamines, but also has been using fentanyl.  He was at his probation officer's office prior to him going to the [facility] emergency room and seeking admission.  He is essentially homeless at this point.  He has a court date on 11/14.  This is for possession of substances as well as a driving under the influence.  He stated his lawyer believes that he will not serve time for the possession, and that he will probably not be incarcerated after this.  He admitted to helplessness, hopelessness and worthlessness as well as suicidal ideation.  He was admitted to the hospital for evaluation and stabilization.  Continued Clinical Symptoms:  Alcohol Use Disorder Identification Test Final Score (AUDIT): 36 The "Alcohol Use Disorders Identification Test", Guidelines for Use in Primary Care, Second Edition.  World Science writer [facility]). Score between 0-7:  no or low risk or alcohol related problems. Score between 8-15:  moderate risk of alcohol related problems. Score between 16-19:  high risk of alcohol related problems. Score 20 or above:  warrants further diagnostic evaluation for alcohol dependence and treatment.   CLINICAL FACTORS:   Bipolar Disorder:   Depressive phase Depression:   Anhedonia Comorbid alcohol abuse/dependence Hopelessness Impulsivity Insomnia Alcohol/Substance Abuse/Dependencies   Musculoskeletal: Strength & Muscle Tone: within normal limits Gait & Station: normal Patient leans: N/A  Psychiatric Specialty Exam: Physical Exam  Nursing  note and vitals reviewed. Constitutional: He is oriented to person, place, and time. He appears well-developed and well-nourished.   HENT:  Head: Normocephalic and atraumatic.  Respiratory: Effort normal.  Neurological: He is alert and oriented to person, place, and time.    ROS  Blood pressure 103/74, pulse 83, temperature 97.9 F (36.6 C), temperature source Oral, resp. rate 18, height 5\' 11"  (1.803 m), weight 80.3 kg, SpO2 98 %.Body mass index is 24.69 kg/m.  General Appearance: Disheveled  Eye Contact:  Minimal  Speech:  Slow  Volume:  Decreased  Mood:  Anxious, Depressed and Dysphoric  Affect:  Congruent  Thought Process:  Coherent and Descriptions of Associations: Intact  Orientation:  Full (Time, Place, and Person)  Thought Content:  Logical  Suicidal Thoughts:  Yes.  without intent/plan  Homicidal Thoughts:  No  Memory:  Immediate;   Fair Recent;   Fair Remote;   Fair  Judgement:  Intact  Insight:  Lacking  Psychomotor Activity:  Decreased and Psychomotor Retardation  Concentration:  Concentration: Fair and Attention Span: Fair  Recall:  [employer] of Knowledge:  Fair  Language:  Good  Akathisia:  Negative  Handed:  Right  AIMS (if indicated):     Assets:  Desire for Improvement Resilience  ADL's:  Intact  Cognition:  WNL  Sleep:  Number of Hours: 6.25      COGNITIVE FEATURES THAT CONTRIBUTE TO RISK:  None    SUICIDE RISK:   Mild:  Suicidal ideation of limited frequency, intensity, duration, and specificity.  There are no identifiable plans, no associated intent, mild dysphoria and related symptoms, good self-control (both objective and subjective assessment), few other risk factors, and identifiable protective factors, including available and accessible social support.  PLAN OF CARE: Patient is seen and examined.  Patient is a 38 year old male with the above-stated past psychiatric history was admitted secondary to worsening depression, suicidal ideation as well as comorbid methamphetamine dependence and opiate dependence.  He will be admitted to the hospital.  He will be integrated  into the milieu.  He will be encouraged to attend groups.  He was discharged on Abilify as well as fluoxetine.  These will be continued.  He will be monitored for withdrawal symptoms.  He had overdosed on clonidine which led to the hospitalization last week, and I will not give him any clonidine today.  I will give him some methocarbamol for withdrawal symptoms.  We may need to increase his fluoxetine during the course hospitalization, but we will monitor how he does over the next couple of days.  I doubt that he will he will be able to get into any rehabilitation program given his court dates and charges, but have asked him to discuss the options with social work.  He is right now essentially homeless, and has really just been staying with friends.  Review of his laboratories showed essentially normal electrolytes.  Normal TSH.  His drug screen here is pending, but he freely admits to fentanyl usage as well as the methamphetamines.  I certify that inpatient services furnished can reasonably be expected to improve the patient's condition.   Sharma Covert, MD 07/24/2019, 9:57 AM

## 2019-07-24 NOTE — Care Management (Signed)
CMA sent referral to [facility]. At this time, [facility] prefers for patients to follow up daily after discharge regarding a possible admission date.  CMA has notified CSW. Radonna Ricker.     Dionisia Pacholski Care Management Assistant  Email:[email or web address] Office: [phone]
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
## 2019-07-25 DIAGNOSIS — F1920 Other psychoactive substance dependence, uncomplicated: Principal | ICD-10-CM

## 2019-07-25 MED ORDER — GABAPENTIN 300 MG PO CAPS
300.0000 mg | ORAL_CAPSULE | Freq: Three times a day (TID) | ORAL | Status: DC
  Administered 2019-07-25 – 2019-07-27 (×7): 300 mg via ORAL
  Filled 2019-07-25: qty 1
  Filled 2019-07-25: qty 21
  Filled 2019-07-25 (×5): qty 1
  Filled 2019-07-25: qty 21
  Filled 2019-07-25: qty 1
  Filled 2019-07-25: qty 21
  Filled 2019-07-25 (×3): qty 1
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
## 2019-07-25 NOTE — Progress Notes (Signed)
D. Pt in bed for much of the shift- complaining of moderate withdrawal symptoms (tremors, chilling, nausea, cramping ). Per pt's self inventory, pt rated his depression, hopelessness and anxiety a 2/0/10, respectively. Pt writes that his goal today is "get to feeling better".  Pt currently denies SI/HI and AVH A. Labs and vitals monitored. Pt compliant with medications. Pt supported emotionally and encouraged to express concerns and ask questions.   R. Pt remains safe with 15 minute checks. Will continue POC.

## 2019-07-25 NOTE — Progress Notes (Signed)
[facility] Group Notes:  (Nursing/MHT/Case Management/Adjunct)  Date:  07/25/2019  Time:  1:24 PM  Type of Therapy:  Nurse Education  Participation Level:  Did Not Attend. Pt asleep.    Leeya Rusconi L 07/25/2019, 1:24 PM 
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
## 2019-07-25 NOTE — Progress Notes (Signed)
[place] NOVEL CORONAVIRUS (COVID-19) DAILY CHECK-OFF SYMPTOMS - answer yes or no to each - every day NO YES  Have you had a fever in the past 24 hours?  . Fever (Temp > 37.80C / 100F) X   Have you had any of these symptoms in the past 24 hours? . New Cough .  Sore Throat  .  Shortness of Breath .  Difficulty Breathing .  Unexplained Body Aches   X   Have you had any one of these symptoms in the past 24 hours not related to allergies?   . Runny Nose .  Nasal Congestion .  Sneezing   X   If you have had runny nose, nasal congestion, sneezing in the past 24 hours, has it worsened?  X   EXPOSURES - check yes or no X   Have you traveled outside the state in the past 14 days?  X   Have you been in contact with someone with a confirmed diagnosis of COVID-19 or PUI in the past 14 days without wearing appropriate PPE?  X   Have you been living in the same home as a person with confirmed diagnosis of COVID-19 or a PUI (household contact)?    X   Have you been diagnosed with COVID-19?    X              What to do next: Answered NO to all: Answered YES to anything:   Proceed with unit schedule Follow the [facility] Inpatient Flowsheet.   

## 2019-07-25 NOTE — Progress Notes (Signed)
[facility] Group Notes:  (Nursing/MHT/Case Management/Adjunct)  Date:  07/25/2019  Time:  2030  Type of Therapy:  wrap up group  Participation Level:  Active  Participation Quality:  Attentive and Sharing  Affect:  Irritable  Cognitive:  Alert  Insight:  Lacking  Engagement in Group:  Supportive  Modes of Intervention:  Clarification, Education and Support  Summary of Progress/Problems: Pt reported getting good sleep today. Pt plans on changing people, places, and things especially since he is about to move into a hotel on house arrest. Pt had a visitor this evening who he reports being grateful for "her". Pt shares that she is clean and sober.   Shellia Cleverly 07/25/2019, 10:44 PM

## 2019-07-26 MED ORDER — BACITRACIN-NEOMYCIN-POLYMYXIN 400-5-5000 EX OINT
TOPICAL_OINTMENT | Freq: Two times a day (BID) | CUTANEOUS | Status: DC
  Administered 2019-07-27: 09:00:00 via TOPICAL

## 2019-07-26 MED ORDER — SALINE SPRAY 0.65 % NA SOLN
1.0000 | NASAL | Status: DC | PRN
  Administered 2019-07-27: 10:00:00 1 via NASAL

## 2019-07-26 MED ORDER — LORATADINE 10 MG PO TABS
ORAL_TABLET | ORAL | Status: AC
  Administered 2019-07-26: 23:00:00
  Filled 2019-07-26: qty 1

## 2019-07-26 MED ORDER — SALINE SPRAY 0.65 % NA SOLN
NASAL | Status: AC
  Administered 2019-07-26: 23:00:00
  Filled 2019-07-26: qty 44

## 2019-07-26 MED ORDER — FLUOXETINE HCL 20 MG PO CAPS
20.0000 mg | ORAL_CAPSULE | Freq: Every day | ORAL | Status: DC
  Administered 2019-07-27: 20 mg via ORAL
  Filled 2019-07-26 (×2): qty 1
  Filled 2019-07-26: qty 7

## 2019-07-26 MED ORDER — ENSURE ENLIVE PO LIQD
237.0000 mL | Freq: Two times a day (BID) | ORAL | Status: DC
  Administered 2019-07-26 (×2): 237 mL via ORAL

## 2019-07-26 MED ORDER — LORATADINE 10 MG PO TABS
10.0000 mg | ORAL_TABLET | Freq: Every day | ORAL | Status: DC
  Administered 2019-07-27: 10 mg via ORAL
  Filled 2019-07-26: qty 1
  Filled 2019-07-26: qty 7
  Filled 2019-07-26: qty 1
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
## 2019-07-26 NOTE — Progress Notes (Signed)
[facility] MD Progress Note  07/26/2019 10:02 AM John [name]  MRN:  [id number]   Subjective:   John [name] stated " I still have been stomach issues and I just want to sleep."   Evaluation: Zamere observed resting in bed.  He presents irritable, guarded and depressed.  He denies suicidal or homicidal ideations during this assessment. Stated " I was suicidal when I came in."  denies auditory or visual hallucinations.  Patient reports worsening anxiety and states "I do not feel ready to discharge today."  Reported fair appetite.  Stated one restless night on last night, however is mood has improving overall.  Denied attending daily group session. [name] reports he needs to get his rest and is unable to stay wake during group time.  Reports he will make an effort to attend more groups during this admission.  Rates his depression 3 out of 10 with 10 being the worst.  Discussed titration to Prozac to 20 mg.  Patient encouraged to report symptoms to nurse will monitor and manage her medication as appropriate.  Support, encouragement and  reassurance was provided.   Principal Problem: Polysubstance dependence including opioid type drug with complication, continuous use (HCC) Diagnosis: Principal Problem:   Polysubstance dependence including opioid type drug with complication, continuous use (HCC) Active Problems:   Polysubstance abuse (HCC)   MDD (major depressive disorder), recurrent episode, severe (HCC)  Total Time spent with patient: 15 minutes  Past Psychiatric History:   Past Medical History:  Past Medical History:  Diagnosis Date  . Anxiety   . Asthma   . CVA (cerebral infarction)    drug induced  . Depression   . Drug addiction (HCC)   . Hepatitis C   . Seizures (HCC)    drug induced    Past Surgical History:  Procedure Laterality Date  . ABDOMINAL SURGERY    . INCISION AND DRAINAGE OF WOUND Left 09/22/2017   Procedure: IRRIGATION AND DEBRIDEMENT WOUND LEFT ELBOW ANTECUBITAL SPACE;   Surgeon: Teryl Lucy, MD;  Location: [facility];  Service: Orthopedics;  Laterality: Left;   Family History:  Family History  Problem Relation Age of Onset  . Diabetes Paternal Uncle   . Diabetes Paternal Grandmother   . Depression Mother   . Depression Father   . Alcoholism Other    Family Psychiatric  History:  Social History:  Social History   Substance and Sexual Activity  Alcohol Use Yes  . Alcohol/week: 1.0 standard drinks  . Types: 1 Cans of beer per week   Comment: one cse beer daily     Social History   Substance and Sexual Activity  Drug Use Yes  . Types: Marijuana, Heroin   Comment: Opana, heroin, and pain medications    Social History   Socioeconomic History  . Marital status: Single    Spouse name: Not on file  . Number of children: Not on file  . Years of education: Not on file  . Highest education level: Not on file  Occupational History  . Not on file  Social Needs  . Financial resource strain: Not on file  . Food insecurity    Worry: Not on file    Inability: Not on file  . Transportation needs    Medical: Not on file    Non-medical: Not on file  Tobacco Use  . Smoking status: Current Every Day Smoker    Packs/day: 2.00    Types: Cigarettes  . Smokeless tobacco: Never Used  Substance and Sexual  Activity  . Alcohol use: Yes    Alcohol/week: 1.0 standard drinks    Types: 1 Cans of beer per week    Comment: one cse beer daily  . Drug use: Yes    Types: Marijuana, Heroin    Comment: Opana, heroin, and pain medications  . Sexual activity: Not on file  Lifestyle  . Physical activity    Days per week: Not on file    Minutes per session: Not on file  . Stress: Not on file  Relationships  . Social Herbalist on phone: Not on file    Gets together: Not on file    Attends religious service: Not on file    Active member of club or organization: Not on file    Attends meetings of clubs or organizations: Not on file    Relationship  status: Not on file  Other Topics Concern  . Not on file  Social History Narrative  . Not on file   Additional Social History:    Pain Medications: see MAR Prescriptions: see MAR Over the Counter: see MAR History of alcohol / drug use?: Yes Longest period of sobriety (when/how long): unsure Negative Consequences of Use: Financial, Personal relationships, Work / Youth worker Withdrawal Symptoms: Sweats, Tremors, Fever / [place] Name of Substance 1: THC 1 - Age of First Use: unsure 1 - Amount (size/oz): one blunt 3-4 weeks ago 1 - Frequency: occasionally 1 - Duration: unsure 1 - Last Use / Amount: 3-4 weeks ago Name of Substance 2: heroin 2 - Age of First Use: 38 yrs old 2 - Amount (size/oz): one bundle daily 2 - Frequency: daily 2 - Duration: over 5 yrs 2 - Last Use / Amount: 2 days ago                Sleep: Fair  Appetite:  Fair  Current Medications: Current Facility-Administered Medications  Medication Dose Route Frequency Provider Last Rate Last Dose  . alum & mag hydroxide-simeth (MAALOX/MYLANTA) 200-200-20 MG/5ML suspension 30 mL  30 mL Oral Q4H PRN Sharma Covert, MD      . ARIPiprazole (ABILIFY) tablet 10 mg  10 mg Oral QHS Sharma Covert, MD   10 mg at 07/25/19 2107  . [START ON 07/27/2019] FLUoxetine (PROZAC) capsule 20 mg  20 mg Oral Daily Derrill Center, NP      . gabapentin (NEURONTIN) capsule 300 mg  300 mg Oral TID Lindell Spar I, NP   300 mg at 07/26/19 1017  . hydrOXYzine (ATARAX/VISTARIL) tablet 25 mg  25 mg Oral TID PRN Sharma Covert, MD   25 mg at 07/25/19 2008  . ibuprofen (ADVIL) tablet 400 mg  400 mg Oral Q6H PRN Sharma Covert, MD      . magnesium hydroxide (MILK OF MAGNESIA) suspension 30 mL  30 mL Oral Daily PRN Sharma Covert, MD      . methocarbamol (ROBAXIN) tablet 750 mg  750 mg Oral Q6H PRN Sharma Covert, MD   750 mg at 07/25/19 2107  . neomycin-bacitracin-polymyxin (NEOSPORIN) ointment   Topical BID Sharma Covert,  MD   1 application at 51/02/58 [phone]  . nicotine (NICODERM CQ - dosed in mg/24 hours) patch 21 mg  21 mg Transdermal Daily Sharma Covert, MD   21 mg at 07/26/19 0827  . pantoprazole (PROTONIX) EC tablet 40 mg  40 mg Oral Daily Sharma Covert, MD   40 mg at 07/26/19 0827  .  traZODone (DESYREL) tablet 150 mg  150 mg Oral QHS PRN Maryagnes AmosStarkes-Perry, Takia S, FNP   150 mg at 07/25/19 2107    Lab Results:  Results for orders placed or performed during the hospital encounter of 07/23/19 (from the past 48 hour(s))  Urinalysis, Complete w Microscopic     Status: None   Collection Time: 07/24/19 11:56 AM  Result Value Ref Range   Color, Urine YELLOW YELLOW   APPearance CLEAR CLEAR   Specific Gravity, Urine 1.017 1.005 - 1.030   pH 6.0 5.0 - 8.0   Glucose, UA NEGATIVE NEGATIVE mg/dL   Hgb urine dipstick NEGATIVE NEGATIVE   Bilirubin Urine NEGATIVE NEGATIVE   Ketones, ur NEGATIVE NEGATIVE mg/dL   Protein, ur NEGATIVE NEGATIVE mg/dL   Nitrite NEGATIVE NEGATIVE   Leukocytes,Ua NEGATIVE NEGATIVE   RBC / HPF 0-5 0 - 5 RBC/hpf   WBC, UA 0-5 0 - 5 WBC/hpf   Bacteria, UA NONE SEEN NONE SEEN   Mucus PRESENT     Comment: Performed at [facility], [address] [place], [place] [address]  Rapid urine drug screen (hospital performed)     Status: Abnormal   Collection Time: 07/24/19 11:56 AM  Result Value Ref Range   Opiates NONE DETECTED NONE DETECTED   Cocaine NONE DETECTED NONE DETECTED   Benzodiazepines POSITIVE (A) NONE DETECTED   Amphetamines POSITIVE (A) NONE DETECTED   Tetrahydrocannabinol NONE DETECTED NONE DETECTED   Barbiturates NONE DETECTED NONE DETECTED    Comment: (NOTE) DRUG SCREEN FOR MEDICAL PURPOSES ONLY.  IF CONFIRMATION IS NEEDED FOR ANY PURPOSE, NOTIFY LAB WITHIN 5 DAYS. LOWEST DETECTABLE LIMITS FOR URINE DRUG SCREEN Drug Class                     Cutoff (ng/mL) Amphetamine and metabolites    1000 Barbiturate and metabolites    200 Benzodiazepine                  200 Tricyclics and metabolites     300 Opiates and metabolites        300 Cocaine and metabolites        300 THC                            50 Performed at [facility], [address] [place], [place] [address]     Blood Alcohol level:  Lab Results  Component Value Date   [facility]   ETH <10 06/12/2019    Metabolic Disorder Labs: Lab Results  Component Value Date   HGBA1C 5.5 07/24/2019   MPG 111.15 07/24/2019   MPG 117 03/22/2016   No results found for: PROLACTIN Lab Results  Component Value Date   CHOL 150 07/24/2019   TRIG 78 07/24/2019   HDL 32 (L) 07/24/2019   CHOLHDL 4.7 07/24/2019   VLDL 16 07/24/2019   LDLCALC 102 (H) 07/24/2019   LDLCALC 113 (H) 03/22/2016    Physical Findings: AIMS: Facial and Oral Movements Muscles of Facial Expression: None, normal Lips and Perioral Area: None, normal Jaw: None, normal Tongue: None, normal,Extremity Movements Upper (arms, wrists, hands, fingers): None, normal Lower (legs, knees, ankles, toes): None, normal, Trunk Movements Neck, shoulders, hips: None, normal, Overall Severity Severity of abnormal movements (highest score from questions above): None, normal Incapacitation due to abnormal movements: None, normal Patient's awareness of abnormal movements (rate only patient's report): No Awareness, Dental Status  Current problems with teeth and/or dentures?: No Does patient usually wear dentures?: No  CIWA:  CIWA-Ar Total: 7 COWS:  COWS Total Score: 9  Musculoskeletal: Strength & Muscle Tone: within normal limits Gait & Station: normal Patient leans: N/A  Psychiatric Specialty Exam: Physical Exam  Skin: Skin is warm and dry.    Review of Systems  Psychiatric/Behavioral: Positive for depression and substance abuse. Negative for suicidal ideas. The patient is nervous/anxious.   All other systems reviewed and are negative.   Blood pressure 103/74, pulse 83, temperature  97.9 F (36.6 C), temperature source Oral, resp. rate 18, height 5\' 11"  (1.803 m), weight 80.3 kg, SpO2 98 %.Body mass index is 24.69 kg/m.  General Appearance: Casual  Eye Contact:  Fair  Speech:  Clear and Coherent  Volume:  Normal  Mood:  Anxious and Depressed  Affect:  Congruent  Thought Process:  Coherent  Orientation:  Full (Time, Place, and Person)  Thought Content:  Hallucinations: None and Rumination  Suicidal Thoughts:  No  Homicidal Thoughts:  No  Memory:  Immediate;   Fair Recent;   Fair  Judgement:  Fair  Insight:  Fair  Psychomotor Activity:  Normal  Concentration:  Concentration: Fair  Recall:  of Knowledge:  Fair  Language:  Fair  Akathisia:  No  Handed:  Right  AIMS (if indicated):     Assets:  Communication Skills Desire for Improvement Resilience Social Support  ADL's:  Intact  Cognition:  WNL  Sleep:  Number of Hours: 6.5     Treatment Plan Summary: Daily contact with patient to assess and evaluate symptoms and progress in treatment and Medication management   Continue with current treatment plan on 07/26/2019 as listed below except were noted  Mood stabilization medication:  Continue Abilify 10 mg p.o. at bedtime Increase Prozac 10 mg to 20 mg p.o. daily Continue gabapentin 300 mg p.o. 3 times daily Continue trazodone 150 mg p.o. nightly Continue Vistaril 25 mg p.o. as needed 3 times daily  CSW to continue working on discharge disposition Patient encouraged to participate within the therapeutic milieu  13/04/2019, NP 07/26/2019, 10:02 AM

## 2019-07-26 NOTE — Progress Notes (Signed)
D:  Patient's self inventory sheet, patient sleeps good, sleep medication helpful.  Good appetite, high energy level, poor concentration.  Denied anxiety, depression and hopeless.  Withdrawals, tremors,.  Denied SI.  Then checked sometimes.  Has spot on his head.  No physical pain.  Goal is have a positive attitude.  Plans to be positive.  Does have discharge plans. A:  Medications administered per MD orders.  Emotional support and encouragement given patient. R:  Denied SI and H while talking to nurse this morningI, contracts for safety.  Denied A/V hallucinations.  Safety maintained with 15 minute checks. Patient took nap this morning.  When he woke up, he shows pillow to nurse.  There were approximately 4 red  spots on pillow size of nickel.  No problems on back of head.  No blood, puss, etc.  Advised patient to lay on his side while sleeping.  Respirations even and unlabored.  No sign of pain.disress noted on patient's face/body movements.

## 2019-07-26 NOTE — Progress Notes (Signed)
NUTRITION ASSESSMENT  Pt identified as at risk on the Malnutrition Screen Tool  INTERVENTION: 1. Supplements: Ensure Enlive po BID, each supplement provides 350 kcal and 20 grams of protein  NUTRITION DIAGNOSIS: Unintentional weight loss related to sub-optimal intake as evidenced by pt report.   Goal: Pt to meet >/= 90% of their estimated nutrition needs.  Monitor:  PO intake  Assessment:  Pt admitted with polysubstance abuse and having withdrawal symptoms. Pt reports using methamphetamines and UDS was positive for benzos.  Per weight records, pt has lost 6 lbs since 9/30 (3% wt loss x 1 month, insignificant for time frame). Pt would likely benefit from nutritional supplementation, will order Ensure supplements.  Height: Ht Readings from Last 1 Encounters:  07/23/19 5\' 11"  (1.803 m)    Weight: Wt Readings from Last 1 Encounters:  07/23/19 80.3 kg    Weight Hx: Wt Readings from Last 10 Encounters:  07/23/19 80.3 kg  06/17/19 83.2 kg  09/18/17 83.2 kg  10/13/13 96.2 kg  06/07/13 97.5 kg    BMI:  Body mass index is 24.69 kg/m. Pt meets criteria for normal based on current BMI.  Estimated Nutritional Needs: Kcal: 25-30 kcal/kg Protein: > 1 gram protein/kg Fluid: 1 ml/kcal  Diet Order:  Diet Order            Diet regular Room service appropriate? Yes; Fluid consistency: Thin  Diet effective now             Pt is also offered choice of unit snacks mid-morning and mid-afternoon.  Pt is eating as desired.   Lab results and medications reviewed.   Clayton Bibles, MS, RD, LDN Inpatient Clinical Dietitian Pager: [phone] After Hours Pager: [phone]

## 2019-07-26 NOTE — Progress Notes (Signed)
[place] Group Notes:  (Nursing/MHT/Case Management/Adjunct)  Date:  07/26/2019  Time: 2030  Type of Therapy:  wrap up group  Participation Level:  Active  Participation Quality:  Appropriate, Attentive, Sharing and Supportive  Affect:  Appropriate  Cognitive:  Lacking  Insight:  Limited  Engagement in Group:  Engaged  Modes of Intervention:  Clarification, Education and Support  Summary of Progress/Problems: Pt shared his favorite person, place, and sound. Pt reported he had a good day and one thing he likes about himself that although he hasn't had a lot of luck with helping himself, especially with his addiction, he always comes through one 66 percent for his friends and family.   Shellia Cleverly 07/26/2019, 9:46 PM

## 2019-07-26 NOTE — Progress Notes (Signed)
D.  Pt appears anxious on approach, complaint of anxiety.  Pt was positive for evening wrap up group, observed engaged appropriately with peers on the unit.  Pt denies SI/HI/AVH at this time.  A.  Support and encouragement offered, medication given as ordered  R. Pt remains safe on the unit, will continue to monitor.

## 2019-07-26 NOTE — Progress Notes (Signed)
Pt has a  visitor in the  room. Pt has his left hand inside the pt's jeans pants. As the staff walk into the pt's room for 15 min check. Pt takes his left hand out of his visitor jean pants. RN notify, Agricultural consultant notify and [facility] RN notify.

## 2019-07-27 DIAGNOSIS — F1120 Opioid dependence, uncomplicated: Secondary | ICD-10-CM

## 2019-07-27 DIAGNOSIS — F1520 Other stimulant dependence, uncomplicated: Secondary | ICD-10-CM

## 2019-07-27 MED ORDER — PANTOPRAZOLE SODIUM 40 MG PO TBEC: 40.0000 mg | DELAYED_RELEASE_TABLET | Freq: Every day | ORAL | 0 refills | Status: DC

## 2019-07-27 MED ORDER — BACITRACIN-NEOMYCIN-POLYMYXIN 400-5-5000 EX OINT: TOPICAL_OINTMENT | Freq: Two times a day (BID) | CUTANEOUS | 0 refills | Status: DC

## 2019-07-27 MED ORDER — SALINE SPRAY 0.65 % NA SOLN: 1.0000 | NASAL | 0 refills | Status: DC | PRN

## 2019-07-27 MED ORDER — FLUOXETINE HCL 20 MG PO CAPS: 20.0000 mg | ORAL_CAPSULE | Freq: Every day | ORAL | 0 refills | Status: DC

## 2019-07-27 MED ORDER — ARIPIPRAZOLE 10 MG PO TABS: 10.0000 mg | ORAL_TABLET | Freq: Every day | ORAL | 0 refills | Status: DC

## 2019-07-27 MED ORDER — LORATADINE 10 MG PO TABS: 10.0000 mg | ORAL_TABLET | Freq: Every day | ORAL | 0 refills | Status: DC

## 2019-07-27 MED ORDER — GABAPENTIN 300 MG PO CAPS: 300.0000 mg | ORAL_CAPSULE | Freq: Three times a day (TID) | ORAL | 0 refills | Status: DC

## 2019-07-27 MED ORDER — HYDROXYZINE HCL 25 MG PO TABS: 25.0000 mg | ORAL_TABLET | Freq: Three times a day (TID) | ORAL | 0 refills | Status: DC | PRN

## 2019-07-27 MED ORDER — TRAZODONE HCL 150 MG PO TABS: 150.0000 mg | ORAL_TABLET | Freq: Every evening | ORAL | 0 refills | Status: DC | PRN

## 2019-07-27 MED ORDER — NICOTINE 21 MG/24HR TD PT24: 21.0000 mg | MEDICATED_PATCH | Freq: Every day | TRANSDERMAL | 0 refills | Status: DC

## 2019-07-27 NOTE — Discharge Summary (Signed)
Physician Discharge Summary Note  Patient:  John [name] is an 38 y.o., male MRN:  [id number] DOB:  1981-01-19 Patient phone:  [phone] (home)  Patient address:   [address] [place] [address],  Total Time spent with patient: 15 minutes  Date of Admission:  07/23/2019 Date of Discharge: 07/27/19  Reason for Admission:  Opioid and methamphetamine dependence with suicidal ideation  Principal Problem: Polysubstance dependence including opioid type drug with complication, continuous use (HCC) Discharge Diagnoses: Principal Problem:   Polysubstance dependence including opioid type drug with complication, continuous use (HCC) Active Problems:   Polysubstance abuse (HCC)   MDD (major depressive disorder), recurrent episode, severe (HCC)   Past Psychiatric History: History of polysubstance dependence and bipolar disorder with multiple past hospitalizations.  Past Medical History:  Past Medical History:  Diagnosis Date  . Anxiety   . Asthma   . CVA (cerebral infarction)    drug induced  . Depression   . Drug addiction (HCC)   . Hepatitis C   . Seizures (HCC)    drug induced    Past Surgical History:  Procedure Laterality Date  . ABDOMINAL SURGERY    . INCISION AND DRAINAGE OF WOUND Left 09/22/2017   Procedure: IRRIGATION AND DEBRIDEMENT WOUND LEFT ELBOW ANTECUBITAL SPACE;  Surgeon: John Lucy, MD;  Location: [facility];  Service: Orthopedics;  Laterality: Left;   Family History:  Family History  Problem Relation Age of Onset  . Diabetes Paternal Uncle   . Diabetes Paternal Grandmother   . Depression Mother   . Depression Father   . Alcoholism Other    Family Psychiatric  History: Father with depression and alcohol use disorder Social History:  Social History   Substance and Sexual Activity  Alcohol Use Yes  . Alcohol/week: 1.0 standard drinks  . Types: 1 Cans of beer per week   Comment: one cse beer daily     Social History   Substance and Sexual Activity   Drug Use Yes  . Types: Marijuana, Heroin   Comment: Opana, heroin, and pain medications    Social History   Socioeconomic History  . Marital status: Single    Spouse name: Not on file  . Number of children: Not on file  . Years of education: Not on file  . Highest education level: Not on file  Occupational History  . Not on file  Social Needs  . Financial resource strain: Not on file  . Food insecurity    Worry: Not on file    Inability: Not on file  . Transportation needs    Medical: Not on file    Non-medical: Not on file  Tobacco Use  . Smoking status: Current Every Day Smoker    Packs/day: 2.00    Types: Cigarettes  . Smokeless tobacco: Never Used  Substance and Sexual Activity  . Alcohol use: Yes    Alcohol/week: 1.0 standard drinks    Types: 1 Cans of beer per week    Comment: one cse beer daily  . Drug use: Yes    Types: Marijuana, Heroin    Comment: Opana, heroin, and pain medications  . Sexual activity: Not on file  Lifestyle  . Physical activity    Days per week: Not on file    Minutes per session: Not on file  . Stress: Not on file  Relationships  . Social Musician on phone: Not on file    Gets together: Not on file  Attends religious service: Not on file    Active member of club or organization: Not on file    Attends meetings of clubs or organizations: Not on file    Relationship status: Not on file  Other Topics Concern  . Not on file  Social History Narrative  . Not on file    Hospital Course:  From admission H&P: Patient is a 38 year old male with a past psychiatric history significant for methamphetamine dependence as well as opiate dependence who presented to the [facility] department on 07/23/2019 with suicidal ideation. The patient stated that recently he had been doing very poorly. He had been in jail approximately 12 months ago, and had remained sober of substances for 10 months afterwards. He stated that  he has daughters from a previous relationship. Apparently the boyfriend of the mother of the children has been attempting or doing inappropriate things with his daughters, and that upset him greatly. The mother of the children was not allowing him to see the children. This led him after 10 months of sobriety to relapse, and start going downhill. He is on probation for substance related issues. He initially self presented to a [facility] substance rehabilitation program, but left after 5 days. He was given clonidine as part of his opiate withdrawal at that time, but several days afterwards overdosed on clonidine and then presented to the [facility]. He was admitted to psychiatry there and was kept for several days. He was discharged on 07/16/2019. He was discharged on Abilify and fluoxetine. He stated he has a history of bipolar disorder, and he feels as though the bipolar disorder being untreated leads to worsening substance abuse issues. He stated he has been using an excessive amount of amphetamines, but also has been using fentanyl. He was at his probation officer's office prior to him going to the [facility] emergency room and seeking admission. He is essentially homeless at this point. He has a court date on 11/14. This is for possession of substances as well as a driving under the influence. He stated his lawyer believes that he will not serve time for the possession, and that he will probably not be incarcerated after this. He admitted to helplessness, hopelessness and worthlessness as well as suicidal ideation. He was admitted to the hospital for evaluation and stabilization.  John [name] was admitted for methamphetamine and opioid dependence with suicidal ideation. He remained on the [facility] unit for four days. He was started on Abilify, Prozac, Neurontin, and PRN Vistaril. He participated in group therapy on the unit. He responded well to treatment with no adverse  effects reported. He has shown improved mood, affect, sleep, and interaction. On day of discharge, he presents with euthymic affect and reports stable mood. He denies any SI/HI/AVH and contracts for safety. He denies withdrawal symptoms. He is discharging on the medications listed below. He agrees to follow up at [facility] and [place] (see below). Patient is provided with prescriptions and medication samples upon discharge. His girlfriend is picking him up for discharge home.  Physical Findings: AIMS: Facial and Oral Movements Muscles of Facial Expression: None, normal Lips and Perioral Area: None, normal Jaw: None, normal Tongue: None, normal,Extremity Movements Upper (arms, wrists, hands, fingers): None, normal Lower (legs, knees, ankles, toes): None, normal, Trunk Movements Neck, shoulders, hips: None, normal, Overall Severity Severity of abnormal movements (highest score from questions above): None, normal Incapacitation due to abnormal movements: None, normal Patient's awareness of abnormal movements (rate only  patient's report): No Awareness, Dental Status Current problems with teeth and/or dentures?: No Does patient usually wear dentures?: No  CIWA:  CIWA-Ar Total: 1 COWS:  COWS Total Score: 3  Musculoskeletal: Strength & Muscle Tone: within normal limits Gait & Station: normal Patient leans: N/A  Psychiatric Specialty Exam: Physical Exam  Nursing note and vitals reviewed. Constitutional: He is oriented to person, place, and time. He appears well-developed and well-nourished.  Cardiovascular: Normal rate.  Respiratory: Effort normal.  Neurological: He is alert and oriented to person, place, and time.    Review of Systems  Constitutional: Negative.   Respiratory: Negative for cough and shortness of breath.   Cardiovascular: Negative for chest pain.  Psychiatric/Behavioral: Positive for depression (stable on medication) and substance abuse (meth, opioids, BZDs). Negative for  hallucinations and suicidal ideas. The patient is not nervous/anxious and does not have insomnia.     Blood pressure 134/86, pulse (!) 103, temperature 97.8 F (36.6 C), temperature source Oral, resp. rate 18, height 5\' 11"  (1.803 m), weight 80.3 kg, SpO2 97 %.Body mass index is 24.69 kg/m.  See MD's discharge SRA    Have you used any form of tobacco in the last 30 days? (Cigarettes, Smokeless Tobacco, Cigars, and/or Pipes): Yes  Has this patient used any form of tobacco in the last 30 days? (Cigarettes, Smokeless Tobacco, Cigars, and/or Pipes) Yes, a prescription for an FDA-approved medication for tobacco cessation was offered at discharge.   Blood Alcohol level:  Lab Results  Component Value Date   ETH <10 07/24/2019   ETH <10 06/12/2019    Metabolic Disorder Labs:  Lab Results  Component Value Date   HGBA1C 5.5 07/24/2019   MPG 111.15 07/24/2019   MPG 117 03/22/2016   No results found for: PROLACTIN Lab Results  Component Value Date   CHOL 150 07/24/2019   TRIG 78 07/24/2019   HDL 32 (L) 07/24/2019   CHOLHDL 4.7 07/24/2019   VLDL 16 07/24/2019   LDLCALC 102 (H) 07/24/2019   LDLCALC 113 (H) 03/22/2016    See Psychiatric Specialty Exam and Suicide Risk Assessment completed by Attending Physician prior to discharge.  Discharge destination:  Home  Is patient on multiple antipsychotic therapies at discharge:  No   Has Patient had three or more failed trials of antipsychotic monotherapy by history:  No  Recommended Plan for Multiple Antipsychotic Therapies: NA  Discharge Instructions    Discharge instructions   Complete by: As directed    Patient is instructed to take all prescribed medications as recommended. Report any side effects or adverse reactions to your outpatient psychiatrist. Patient is instructed to abstain from alcohol and illegal drugs while on prescription medications. In the event of worsening symptoms, patient is instructed to call the crisis  hotline, 911, or go to the nearest emergency department for evaluation and treatment.     Allergies as of 07/27/2019      Reactions   Haldol [haloperidol Lactate] Anaphylaxis, Swelling   Tylenol [acetaminophen] Other (See Comments)   Patient chooses to limit his intake of this   Risperdal [risperidone] Anxiety, Other (See Comments)   Dyskinesia, also      Medication List    TAKE these medications     Indication  ARIPiprazole 10 MG tablet Commonly known as: ABILIFY Take 1 tablet (10 mg total) by mouth at bedtime. What changed:   medication strength  how much to take  when to take this  Indication: Mood   FLUoxetine 20 MG capsule Commonly  known as: PROZAC Take 1 capsule (20 mg total) by mouth daily.  Indication: Depression   gabapentin 300 MG capsule Commonly known as: NEURONTIN Take 1 capsule (300 mg total) by mouth 3 (three) times daily.  Indication: Neuropathic Pain, Agitation   hydrOXYzine 25 MG tablet Commonly known as: ATARAX/VISTARIL Take 1 tablet (25 mg total) by mouth 3 (three) times daily as needed for anxiety.  Indication: Feeling Anxious   loratadine 10 MG tablet Commonly known as: CLARITIN Take 1 tablet (10 mg total) by mouth daily. Start taking on: July 28, 2019  Indication: Hayfever   neomycin-bacitracin-polymyxin ointment Commonly known as: NEOSPORIN Apply topically 2 (two) times daily.  Indication: Wound care   nicotine 21 mg/24hr patch Commonly known as: NICODERM CQ - dosed in mg/24 hours Place 1 patch (21 mg total) onto the skin daily. Start taking on: July 28, 2019  Indication: Nicotine Addiction   pantoprazole 40 MG tablet Commonly known as: PROTONIX Take 1 tablet (40 mg total) by mouth daily. Start taking on: July 28, 2019  Indication: Gastroesophageal Reflux Disease   sodium chloride 0.65 % Soln nasal spray Commonly known as: OCEAN Place 1 spray into both nostrils as needed for congestion.  Indication: Dry nares    traZODone 150 MG tablet Commonly known as: DESYREL Take 1 tablet (150 mg total) by mouth at bedtime as needed for sleep.  Indication: Trouble Sleeping      Follow-up [facility], [facility] Follow up on 07/28/2019.   Why: Hospital discharge appointment is Tuesday 11/10 at 1:30p.  Be sure to bring your photo ID, insurance card, proof of income, SSN, current medications, and discharge paperwork from this hospitalization.  Contact information: [address] [place] [id number] [phone]        [facility] Follow up.   Specialty: Addiction Medicine Why: A referral has been made on your behalf. At this time treatment beds are not available.  Contact information: [address] [place] [address] [phone]           Follow-up recommendations: Activity as tolerated. Diet as recommended by primary care physician. Keep all scheduled follow-up appointments as recommended.   Comments:   Patient is instructed to take all prescribed medications as recommended. Report any side effects or adverse reactions to your outpatient psychiatrist. Patient is instructed to abstain from alcohol and illegal drugs while on prescription medications. In the event of worsening symptoms, patient is instructed to call the crisis hotline, 911, or go to the nearest emergency department for evaluation and treatment.  Signed: Aldean BakerJanet E Keil Pickering, NP 07/27/2019, 10:14 AM

## 2019-07-27 NOTE — [facility] Suicide Risk Assessment (Signed)
[facility] Discharge Suicide Risk Assessment   Principal Problem: Polysubstance dependence including opioid type drug with complication, continuous use ([place]) Discharge Diagnoses: Principal Problem:   Polysubstance dependence including opioid type drug with complication, continuous use ([place]) Active Problems:   Polysubstance abuse ([place])   MDD (major depressive disorder), recurrent episode, severe ([place])   Total Time spent with patient: 15 minutes  Musculoskeletal: Strength & Muscle Tone: within normal limits Gait & Station: normal Patient leans: N/A  Psychiatric Specialty Exam: Review of Systems  All other systems reviewed and are negative.   Blood pressure 134/86, pulse (!) 103, temperature 97.8 F (36.6 C), temperature source Oral, resp. rate 18, height 5\' 11"  (1.803 m), weight 80.3 kg, SpO2 97 %.Body mass index is 24.69 kg/m.  General Appearance: Casual  Eye Contact::  Fair  Speech:  Normal Rate409  Volume:  Normal  Mood:  Anxious  Affect:  Congruent  Thought Process:  Coherent and Descriptions of Associations: Intact  Orientation:  Full (Time, Place, and Person)  Thought Content:  Logical  Suicidal Thoughts:  No  Homicidal Thoughts:  No  Memory:  Immediate;   Fair Recent;   Fair Remote;   Fair  Judgement:  Intact  Insight:  Fair  Psychomotor Activity:  Normal  Concentration:  Fair  Recall:  Good  Fund of Knowledge:Fair  Language: Good  Akathisia:  Negative  Handed:  Right  AIMS (if indicated):     Assets:  Desire for Improvement Resilience  Sleep:  Number of Hours: 5  Cognition: WNL  ADL's:  Intact   Mental Status Per Nursing Assessment::   On Admission:  Suicidal ideation indicated by patient  Demographic Factors:  Male, Divorced or widowed, Caucasian, Low socioeconomic status, Living alone and Unemployed  Loss Factors: NA  Historical Factors: Impulsivity  Risk Reduction Factors:   NA  Continued Clinical Symptoms:  Depression:   Comorbid alcohol  abuse/dependence Impulsivity Alcohol/Substance Abuse/Dependencies  Cognitive Features That Contribute To Risk:  None    Suicide Risk:  Minimal: No identifiable suicidal ideation.  Patients presenting with no risk factors but with morbid ruminations; may be classified as minimal risk based on the severity of the depressive symptoms  Follow-up [place], [facility] Follow up on 07/28/2019.   Why: Hospital discharge appointment is Tuesday 11/10 at 1:30p.  Be sure to bring your photo ID, insurance card, proof of income, SSN, current medications, and discharge paperwork from this hospitalization.  Contact information: [place] [address] [phone]        [facility] Follow up.   Specialty: Addiction Medicine Why: A referral has been made on your behalf. At this time treatment beds are not available.  Contact information: [place] [address] [phone]           Plan Of Care/Follow-up recommendations:  Activity:  ad lib  Sharma Covert, MD 07/27/2019, 9:08 AM

## 2019-07-27 NOTE — Progress Notes (Signed)
Discharge Note:  Patient discharged home with family member.  Denied SI and HI.   Denied A/V hallucinations.  Suicide prevention information given and discussed with patient who stated he understood and had no questions.  Patient stated he received all his belongings, clothing, toiletries, etc.  Patient stated he appreciated all assistance received from [facility] staff.  All required discharge information given to patient at discharge.   

## 2019-07-27 NOTE — Progress Notes (Signed)
D.  Pt very anxious on approach, complaint of nasal congestion.  Pt was afraid due to roommate being ill that he may have caught something.  Pt requested medication for anxiety and congestion.  A.  NP notified and new orders received.  Support and encouragement offered to patient.  R.  Pt remains safe on the unit, appears to have rested well last night, aside from continued head discomfort where scab came off.

## 2019-07-27 NOTE — Progress Notes (Signed)
Recreation Therapy Notes  Date:  11.9.20 Time: 0930 Location: [address] Group Room  Group Topic: Stress Management  Goal Area(s) Addresses:  Patient will identify positive stress management techniques. Patient will identify benefits of using stress management post d/c.  Intervention:  Stress Management  Activity :  Meditation.  LRT introduced the stress management technique of meditation.  LRT played a meditation that focused on taking on the characteristics of a mountain.  Patients were to listen and follow as meditation was played to engage in activity.  Education:  Stress Management, Discharge Planning.   Education Outcome: Acknowledges Education  Clinical Observations/Feedback: Pt did not attend group.    Victorino Sparrow, LRT/CTRS         Victorino Sparrow A 07/27/2019 11:21 AM

## 2019-07-27 NOTE — Progress Notes (Signed)
D:  Patient denied SI and HI, contracts for safety.  Denied A/V hallucinations.   A:  Medications administered per MD orders.  Emotional support and encouragement given patient. R:  Safety maintained with 15 minute checks.  

## 2019-07-27 NOTE — Progress Notes (Signed)
  [facility] Adult Case Management Discharge Plan :  Will you be returning to the same living situation after discharge:  No. Patient plans to discharge to the [facility] temporarily, until a bed become available at an [facility] in [place], [place].  At discharge, do you have transportation home?: Yes,  patient's girlfriend is picking him up Do you have the ability to pay for your medications: No.  Release of information consent forms completed and in the chart;  Patient's signature needed at discharge.  Patient to Follow up at: Follow-up [place], [facility] Follow up on 07/28/2019.   Why: Hospital discharge appointment is Tuesday 11/10 at 1:30p.  Be sure to bring your photo ID, insurance card, proof of income, SSN, current medications, and discharge paperwork from this hospitalization.  Contact information: [place] [address] [phone]        [facility] Follow up.   Specialty: Addiction Medicine Why: A referral has been made on your behalf. At this time treatment beds are not available.  Contact information: [place] [address] [phone]           Next level of care provider has access to [place] and Suicide Prevention discussed: Yes,  with the patient   Have you used any form of tobacco in the last 30 days? (Cigarettes, Smokeless Tobacco, Cigars, and/or Pipes): Yes  Has patient been referred to the Quitline?: Patient refused referral  Patient has been referred for addiction treatment: Yes  Marylee Floras, [place] 07/27/2019, 10:17 AM

## 2021-02-09 DIAGNOSIS — I517 Cardiomegaly: Secondary | ICD-10-CM

## 2021-02-16 ENCOUNTER — Inpatient Hospital Stay (HOSPITAL_COMMUNITY)
Admission: AD | Admit: 2021-02-16 | Discharge: 2021-02-22 | DRG: 186 | Discharge status: Against Medical Advice | Payer: Self-pay | Source: Other Acute Inpatient Hospital | Attending: Internal Medicine | Admitting: Internal Medicine

## 2021-02-16 ENCOUNTER — Encounter (HOSPITAL_COMMUNITY): Payer: Self-pay | Admitting: Internal Medicine

## 2021-02-16 DIAGNOSIS — Z9689 Presence of other specified functional implants: Secondary | ICD-10-CM

## 2021-02-16 DIAGNOSIS — F1220 Cannabis dependence, uncomplicated: Secondary | ICD-10-CM | POA: Diagnosis present

## 2021-02-16 DIAGNOSIS — Z20822 Contact with and (suspected) exposure to covid-19: Secondary | ICD-10-CM | POA: Diagnosis present

## 2021-02-16 DIAGNOSIS — Z833 Family history of diabetes mellitus: Secondary | ICD-10-CM

## 2021-02-16 DIAGNOSIS — N179 Acute kidney failure, unspecified: Secondary | ICD-10-CM

## 2021-02-16 DIAGNOSIS — F1120 Opioid dependence, uncomplicated: Secondary | ICD-10-CM | POA: Diagnosis present

## 2021-02-16 DIAGNOSIS — I10 Essential (primary) hypertension: Secondary | ICD-10-CM

## 2021-02-16 DIAGNOSIS — J869 Pyothorax without fistula: Secondary | ICD-10-CM | POA: Diagnosis present

## 2021-02-16 DIAGNOSIS — Z818 Family history of other mental and behavioral disorders: Secondary | ICD-10-CM

## 2021-02-16 DIAGNOSIS — F250 Schizoaffective disorder, bipolar type: Secondary | ICD-10-CM | POA: Diagnosis present

## 2021-02-16 DIAGNOSIS — F1920 Other psychoactive substance dependence, uncomplicated: Secondary | ICD-10-CM | POA: Diagnosis present

## 2021-02-16 DIAGNOSIS — F17210 Nicotine dependence, cigarettes, uncomplicated: Secondary | ICD-10-CM | POA: Diagnosis present

## 2021-02-16 DIAGNOSIS — J45909 Unspecified asthma, uncomplicated: Secondary | ICD-10-CM | POA: Diagnosis present

## 2021-02-16 DIAGNOSIS — J90 Pleural effusion, not elsewhere classified: Principal | ICD-10-CM | POA: Diagnosis present

## 2021-02-16 DIAGNOSIS — Z8673 Personal history of transient ischemic attack (TIA), and cerebral infarction without residual deficits: Secondary | ICD-10-CM

## 2021-02-16 LAB — CBC
HCT: 29.9 % — ABNORMAL LOW (ref 39.0–52.0)
Hemoglobin: 9.7 g/dL — ABNORMAL LOW (ref 13.0–17.0)
MCH: 29.1 pg (ref 26.0–34.0)
MCHC: 32.4 g/dL (ref 30.0–36.0)
MCV: 89.8 fL (ref 80.0–100.0)
Platelets: 431 10*3/uL — ABNORMAL HIGH (ref 150–400)
RBC: 3.33 MIL/uL — ABNORMAL LOW (ref 4.22–5.81)
RDW: 13.2 % (ref 11.5–15.5)
WBC: 8.3 10*3/uL (ref 4.0–10.5)
nRBC: 0 % (ref 0.0–0.2)

## 2021-02-16 LAB — CREATININE, SERUM
Creatinine, Ser: 1.6 mg/dL — ABNORMAL HIGH (ref 0.61–1.24)
GFR, Estimated: 57 mL/min — ABNORMAL LOW (ref >60)

## 2021-02-16 LAB — HIV ANTIBODY (ROUTINE TESTING W REFLEX): HIV Screen 4th Generation wRfx: NONREACTIVE

## 2021-02-16 MED ORDER — QUETIAPINE FUMARATE 50 MG PO TABS
50.0000 mg | ORAL_TABLET | Freq: Every day | ORAL | Status: DC
  Administered 2021-02-16 – 2021-02-19 (×4): 50 mg via ORAL
  Filled 2021-02-16 (×4): qty 1

## 2021-02-16 MED ORDER — SODIUM CHLORIDE 0.9 % IV SOLN: 250.0000 mL | INTRAVENOUS | Status: DC | PRN

## 2021-02-16 MED ORDER — SENNOSIDES-DOCUSATE SODIUM 8.6-50 MG PO TABS: 1.0000 | ORAL_TABLET | Freq: Every evening | ORAL | Status: DC | PRN

## 2021-02-16 MED ORDER — IRBESARTAN 150 MG PO TABS
150.0000 mg | ORAL_TABLET | Freq: Every day | ORAL | Status: DC
  Administered 2021-02-16 – 2021-02-22 (×7): 150 mg via ORAL
  Filled 2021-02-16 (×7): qty 1

## 2021-02-16 MED ORDER — TRAZODONE HCL 100 MG PO TABS
100.0000 mg | ORAL_TABLET | Freq: Every evening | ORAL | Status: DC | PRN
  Administered 2021-02-16 – 2021-02-19 (×4): 100 mg via ORAL
  Filled 2021-02-16 (×4): qty 1

## 2021-02-16 MED ORDER — CLINDAMYCIN PHOSPHATE 600 MG/50ML IV SOLN
600.0000 mg | Freq: Three times a day (TID) | INTRAVENOUS | Status: DC
  Administered 2021-02-16 – 2021-02-22 (×18): 600 mg via INTRAVENOUS
  Filled 2021-02-16 (×20): qty 50

## 2021-02-16 MED ORDER — ONDANSETRON HCL 4 MG/2ML IJ SOLN
4.0000 mg | Freq: Four times a day (QID) | INTRAMUSCULAR | Status: DC | PRN
  Administered 2021-02-18 – 2021-02-21 (×5): 4 mg via INTRAVENOUS
  Filled 2021-02-16 (×5): qty 2

## 2021-02-16 MED ORDER — ALBUTEROL SULFATE HFA 108 (90 BASE) MCG/ACT IN AERS
2.0000 | INHALATION_SPRAY | RESPIRATORY_TRACT | Status: DC | PRN
  Filled 2021-02-16: qty 6.7

## 2021-02-16 MED ORDER — SODIUM CHLORIDE 0.9% FLUSH: 3.0000 mL | INTRAVENOUS | Status: DC | PRN

## 2021-02-16 MED ORDER — ENOXAPARIN SODIUM 40 MG/0.4ML IJ SOSY
40.0000 mg | PREFILLED_SYRINGE | Freq: Every day | INTRAMUSCULAR | Status: DC
  Administered 2021-02-17 – 2021-02-22 (×5): 40 mg via SUBCUTANEOUS
  Filled 2021-02-16 (×6): qty 0.4

## 2021-02-16 MED ORDER — NICOTINE 21 MG/24HR TD PT24
21.0000 mg | MEDICATED_PATCH | Freq: Every day | TRANSDERMAL | Status: DC
  Administered 2021-02-16 – 2021-02-21 (×5): 21 mg via TRANSDERMAL
  Filled 2021-02-16 (×6): qty 1

## 2021-02-16 MED ORDER — ONDANSETRON HCL 4 MG PO TABS
4.0000 mg | ORAL_TABLET | Freq: Four times a day (QID) | ORAL | Status: DC | PRN
  Administered 2021-02-20 – 2021-02-22 (×5): 4 mg via ORAL
  Filled 2021-02-16 (×6): qty 1

## 2021-02-16 MED ORDER — HYDROMORPHONE HCL 1 MG/ML IJ SOLN
1.0000 mg | INTRAMUSCULAR | Status: AC | PRN
Start: 2021-02-16 — End: 2021-02-18
  Administered 2021-02-16 – 2021-02-18 (×7): 1 mg via INTRAVENOUS
  Filled 2021-02-16 (×7): qty 1

## 2021-02-16 MED ORDER — OXYCODONE HCL 5 MG PO TABS
5.0000 mg | ORAL_TABLET | ORAL | Status: DC | PRN
  Administered 2021-02-17 – 2021-02-19 (×4): 5 mg via ORAL
  Filled 2021-02-16 (×4): qty 1

## 2021-02-16 MED ORDER — SODIUM CHLORIDE 0.9% FLUSH
3.0000 mL | Freq: Two times a day (BID) | INTRAVENOUS | Status: DC
  Administered 2021-02-16 – 2021-02-21 (×7): 3 mL via INTRAVENOUS

## 2021-02-16 MED ORDER — LACTATED RINGERS IV SOLN: INTRAVENOUS | Status: DC

## 2021-02-16 NOTE — H&P (Signed)
History and Physical    John [name] HKV:[id number] DOB: 1981-07-26 DOA: 02/16/2021  PCP: Patient, No Pcp Per (Inactive)   Patient coming from:   Transfer from [facility]  Chief Complaint: Pleural effusion on left.   HPI: John [name] is a 40 y.o. male with medical history significant for substance abuse, affective disorder, previous stroke secondary to drug use, hepatitis C. He was admitted to [facility] on 02/09/21 with a large left-sided pleural effusion.  Reported symptoms started on 02/06/21 with left-sided lateral chest wall pain with shortness of breath.  He had been in the emergency room the day before admission but left AMA.  He returned on May 26-22 with worsening pain and shortness of breath and CT scan at that time revealed a larger pleural effusion with mild cardiomegaly.  That time he been having some chills but no fever and had no other symptoms.  He did have a mild cough that was nonproductive.  He reported the pain was a constant dull pain with intermittent sharp episodes of pain.  He had no history of cardiac disease and no history of DVTs or PE.  CT angiography chest was performed with no PE identified but patient had enlarging pleural effusion.  He was admitted to [facility] and underwent thoracentesis with interventional radiology.  Fluid studies at that time showed a nucleated cell count of 993 with 75% neutrophils and LDH of 3000.  Cultures initially did not show any infection.  Final culture results were not available at this time.  He was initially placed on vancomycin and Zosyn for antibiotic coverage.  Antibiotic was changed to clindamycin secondary to worsening renal function.  Renal function has been slowly improving since antibiotic regimen was changed.  On February 16, 2021 patient was continued as shortness of breath and requiring oxygen and repeat chest x-ray revealed an enlarging pleural effusion on the left again.  Patient was being followed by pulmonology at  [facility] and they recommended patient be evaluated by cardiothoracic surgery for decortication.  No cardiothoracic surgery is available at [facility] so patient was transferred to [facility] for evaluation with cardiothoracic surgery.  He did have an echocardiogram at [facility] which showed left ventricular systolic function normal with an EF of 60 to 65% with no wall motion abnormalities. Patient smokes 1 1/2 pack of cigarettes per day with a 30-40-pack-year history of smoking. Patient admits to smoking and injecting crystal methamphetamine (ICE) and has history of cocaine use. He denies alcohol use  Review of Systems:  General: Reports intermittent chills. Denies fever, weight loss, night sweats.  Denies dizziness.  Denies change in appetite HENT: Denies head trauma, headache, denies change in hearing, tinnitus.  Denies nasal congestion or bleeding.  Denies sore throat, sores in mouth.  Denies difficulty swallowing Eyes: Denies blurry vision, pain in eye, drainage.  Denies discoloration of eyes. Neck: Denies pain.  Denies swelling.  Denies pain with movement. Cardiovascular: Reports left lateral chest pain, Denies palpitations.  Denies edema.  Denies orthopnea Respiratory: Reports shortness of breath. Denies cough.  Denies wheezing.  Denies sputum production Gastrointestinal: Denies abdominal pain, swelling. Denies nausea, vomiting, diarrhea.  Denies melena.  Denies hematemesis. Musculoskeletal: Denies limitation of movement. Denies deformity or swelling. Denies arthralgias or myalgias. Genitourinary: Denies pelvic pain.  Denies urinary frequency or hesitancy.  Denies dysuria.  Skin: Denies rash.  Denies petechiae, purpura, ecchymosis. Neurological: Denies headache. Denies syncope. Denies seizure activity. Denies paresthesia. Denies slurred speech, drooping face. Denies visual change. Psychiatric:  Denies depression or anxiety. Denies hallucinations.  Past Medical  History:  Diagnosis Date  . Anxiety   . Asthma   . CVA (cerebral infarction)    drug induced  . Depression   . Drug addiction (HCC)   . Hepatitis C   . Seizures (HCC)    drug induced    Past Surgical History:  Procedure Laterality Date  . ABDOMINAL SURGERY    . INCISION AND DRAINAGE OF WOUND Left 09/22/2017   Procedure: IRRIGATION AND DEBRIDEMENT WOUND LEFT ELBOW ANTECUBITAL SPACE;  Surgeon: Teryl Lucy, MD;  Location: [facility];  Service: Orthopedics;  Laterality: Left;    Social History  reports that he has been smoking cigarettes. He has been smoking about 2.00 packs per day. He has never used smokeless tobacco. He reports current alcohol use of about 1.0 standard drink of alcohol per week. He reports current drug use. Drugs: Marijuana and Heroin.  Allergies  Allergen Reactions  . Haldol [Haloperidol Lactate] Anaphylaxis and Swelling  . Tylenol [Acetaminophen] Other (See Comments)    Patient chooses to limit his intake of this  . Risperdal [Risperidone] Anxiety and Other (See Comments)    Dyskinesia, also      Family History  Problem Relation Age of Onset  . Diabetes Paternal Uncle   . Diabetes Paternal Grandmother   . Depression Mother   . Depression Father   . Alcoholism Other      Prior to Admission medications   Medication Sig Start Date End Date Taking? Authorizing Provider  ARIPiprazole (ABILIFY) 10 MG tablet Take 1 tablet (10 mg total) by mouth at bedtime. 07/27/19   Aldean Baker, NP  FLUoxetine (PROZAC) 20 MG capsule Take 1 capsule (20 mg total) by mouth daily. 07/27/19   Aldean Baker, NP  gabapentin (NEURONTIN) 300 MG capsule Take 1 capsule (300 mg total) by mouth 3 (three) times daily. 07/27/19   Aldean Baker, NP  hydrOXYzine (ATARAX/VISTARIL) 25 MG tablet Take 1 tablet (25 mg total) by mouth 3 (three) times daily as needed for anxiety. 07/27/19   Aldean Baker, NP  loratadine (CLARITIN) 10 MG tablet Take 1 tablet (10 mg total) by mouth daily. 07/28/19    Aldean Baker, NP  neomycin-bacitracin-polymyxin (NEOSPORIN) ointment Apply topically 2 (two) times daily. 07/27/19   Aldean Baker, NP  nicotine (NICODERM CQ - DOSED IN MG/24 HOURS) 21 mg/24hr patch Place 1 patch (21 mg total) onto the skin daily. 07/28/19   Aldean Baker, NP  pantoprazole (PROTONIX) 40 MG tablet Take 1 tablet (40 mg total) by mouth daily. 07/28/19   Aldean Baker, NP  sodium chloride (OCEAN) 0.65 % SOLN nasal spray Place 1 spray into both nostrils as needed for congestion. 07/27/19   Aldean Baker, NP  traZODone (DESYREL) 150 MG tablet Take 1 tablet (150 mg total) by mouth at bedtime as needed for sleep. 07/27/19   Aldean Baker, NP    Physical Exam: Vitals:   02/16/21 2000  BP: (!) 155/89  Pulse: (!) 103  Resp: 20  Temp: 98.6 F (37 C)  TempSrc: Oral    Constitutional: NAD, calm, comfortable Vitals:   02/16/21 2000  BP: (!) 155/89  Pulse: (!) 103  Resp: 20  Temp: 98.6 F (37 C)  TempSrc: Oral   General: WDWN, Alert and oriented x3.  Eyes: EOMI, PERRL, conjunctivae normal.  Sclera nonicteric HENT:  [place]/AT, external ears normal.  Nares patent without epistasis.  Mucous membranes are moist.  Poor dentition Neck: Soft, normal range of motion, supple, no masses, no thyromegaly.  Trachea midline Respiratory: Diminished breath sounds left mid to lower lung field. Has E-A change, decreased fremitus in left lower lung.  No wheezing, no rhonchi.  Normal respiratory effort. No accessory muscle use.  Cardiovascular: Regular  Rhythm with tachycardia, no murmurs / rubs / gallops. No extremity edema. 2+ pedal pulse.  Abdomen: Soft, no tenderness, nondistended, no rebound or guarding. No masses palpated. Bowel sounds normoactive Musculoskeletal: FROM. no cyanosis. No joint deformity upper and lower extremities. Normal muscle tone.  Skin: Warm, dry, intact no rashes, lesions, ulcers. No induration. Has small bruises at sites of previous IVs on forearms and his self injection  site on right ventral forearm. Neurologic: CN 2-12 grossly intact. Normal speech. Sensation intact to light touch. Strength 5/5 in all extremities.   Psychiatric:  Normal mood.    Labs on Admission: I have personally reviewed following labs and imaging studies  CBC: No results for input(s): WBC, NEUTROABS, HGB, HCT, MCV, PLT in the last 168 hours.  Basic Metabolic Panel: No results for input(s): NA, K, CL, CO2, GLUCOSE, BUN, CREATININE, CALCIUM, MG, PHOS in the last 168 hours.  GFR: CrCl cannot be calculated (Patient's most recent lab result is older than the maximum 21 days allowed.).  Liver Function Tests: No results for input(s): AST, ALT, ALKPHOS, BILITOT, PROT, ALBUMIN in the last 168 hours.  Urine analysis:    Component Value Date/Time   COLORURINE YELLOW 07/24/2019 1156   APPEARANCEUR CLEAR 07/24/2019 1156   APPEARANCEUR Hazy 08/07/2014 1236   LABSPEC 1.017 07/24/2019 1156   LABSPEC 1.025 08/07/2014 1236   PHURINE 6.0 07/24/2019 1156   GLUCOSEU NEGATIVE 07/24/2019 1156   GLUCOSEU Negative 08/07/2014 1236   HGBUR NEGATIVE 07/24/2019 1156   BILIRUBINUR NEGATIVE 07/24/2019 1156   BILIRUBINUR Negative 08/07/2014 1236   KETONESUR NEGATIVE 07/24/2019 1156   PROTEINUR NEGATIVE 07/24/2019 1156   NITRITE NEGATIVE 07/24/2019 1156   LEUKOCYTESUR NEGATIVE 07/24/2019 1156   LEUKOCYTESUR Negative 08/07/2014 1236    Radiological Exams on Admission: No results found.   Assessment/Plan Principal Problem:   Pleural effusion on left Mr. Barnaby is admitted to Medical telemetry floor. He has had thoracentesis twice by IR at [facility] with reaccumulation of left-sided pleural effusion.  Patient will need consultation with cardiothoracic surgery.  Dr. Vickey Sages is notified and surgery service will see patient in the morning to evaluate and make plans for definitive treatment with decortication Supplemental oxygen will be provided if needed to keep O2 sat between 92 to 96% Patient  be given oxycodone for moderate pain and Dilaudid for severe pain. He had cultures of pleural effusion done at [facility].  Will obtain culture results from [facility] lab.  Patient has been on clindamycin for antibiotic coverage which is continued till culture results are known  Active Problems:   Essential hypertension BP is in 160-170/90-98 range.  Started on Avapro.  Monitor blood pressure    AKI (acute kidney injury)  Creatinine is 1.60 with BUN of 16 on labs from February 16, 2021.  Notes from physician at [facility] states that AKI has been improving. IV fluid hydration with LR at 75 ml/hr    Schizoaffective disorder, bipolar type Patient with history of schizoaffective disorder.  Was resumed on Seroquel while at [facility] which he states was helping.  Continue Seroquel    Polysubstance dependence including opioid type drug with complication, continuous use Patient will need social work  consult to assist with finding rehab programs.  Recommend patient go to rehab to obtain sobriety     Tobacco use Nicotine patch provided to prevent nicotine withdrawls.      DVT prophylaxis: Lovenox for DVT prophylaxis Code Status:   Full code Family Communication:  Nursing plan discussed with patient and his wife who is at bedside.  They report understanding and agree with plan.  Questions answered.  Further recommendations to follow as clinically indicated Disposition Plan:   Patient is [facility]:  [facility]  Anticipated DC to:  Home  Anticipated DC date:  2 midnight or more stay  Anticipated DC barriers: No barriers to discharge identified at this time  Consults called:  Cardiothoracic surgery-Dr. Renaldo FiddlerAdkins, notified  Admission status:  Inpatient  Claudean SeveranceBradley S Aviel Davalos MD [facility]  How to contact the [facility] Attending or Consulting provider 7A - 7P or covering provider during after hours 7P -7A, for this patient?   1. Check the care team in  [facility] and look for a) attending/consulting TRH provider listed and b) the [facility] team listed 2. Log into [email or web address] and use [place]'s universal password to access. If you do not have the password, please contact the hospital operator. 3. Locate the [facility] provider you are looking for under [facility] and page to a number that you can be directly reached. 4. If you still have difficulty reaching the provider, please page the [facility] (Director on Call) for the Hospitalists listed on amion for assistance.  02/16/2021, 8:58 PM

## 2021-02-16 NOTE — Plan of Care (Signed)

## 2021-02-17 ENCOUNTER — Other Ambulatory Visit: Payer: Self-pay

## 2021-02-17 DIAGNOSIS — F1920 Other psychoactive substance dependence, uncomplicated: Secondary | ICD-10-CM

## 2021-02-17 DIAGNOSIS — I10 Essential (primary) hypertension: Secondary | ICD-10-CM

## 2021-02-17 LAB — BASIC METABOLIC PANEL
Anion gap: 10 (ref 5–15)
BUN: 10 mg/dL (ref 6–20)
CO2: 30 mmol/L (ref 22–32)
Calcium: 8.7 mg/dL — ABNORMAL LOW (ref 8.9–10.3)
Chloride: 98 mmol/L (ref 98–111)
Creatinine, Ser: 1.57 mg/dL — ABNORMAL HIGH (ref 0.61–1.24)
GFR, Estimated: 58 mL/min — ABNORMAL LOW (ref >60)
Glucose, Bld: 101 mg/dL — ABNORMAL HIGH (ref 70–99)
Potassium: 3.8 mmol/L (ref 3.5–5.1)
Sodium: 138 mmol/L (ref 135–145)

## 2021-02-17 LAB — CBC
HCT: 28.2 % — ABNORMAL LOW (ref 39.0–52.0)
Hemoglobin: 9.2 g/dL — ABNORMAL LOW (ref 13.0–17.0)
MCH: 29.1 pg (ref 26.0–34.0)
MCHC: 32.6 g/dL (ref 30.0–36.0)
MCV: 89.2 fL (ref 80.0–100.0)
Platelets: 358 10*3/uL (ref 150–400)
RBC: 3.16 MIL/uL — ABNORMAL LOW (ref 4.22–5.81)
RDW: 13.3 % (ref 11.5–15.5)
WBC: 7.5 10*3/uL (ref 4.0–10.5)
nRBC: 0.3 % — ABNORMAL HIGH (ref 0.0–0.2)

## 2021-02-17 LAB — SARS CORONAVIRUS 2 (TAT 6-24 HRS): SARS Coronavirus 2: NEGATIVE

## 2021-02-17 NOTE — Progress Notes (Signed)
PROGRESS NOTE    John [name]  TMH:[id number] DOB: Oct 29, 1980 DOA: 02/16/2021 PCP: Patient, No Pcp Per (Inactive)    Brief Narrative:  40 y.o. male with medical history significant for substance abuse, affective disorder, previous stroke secondary to drug use, hepatitis C. He was admitted to [facility] on 02/09/21 with a large left-sided pleural effusion.  Reported symptoms started on 02/06/21 with left-sided lateral chest wall pain with shortness of breath.  He had been in the emergency room the day before admission but left AMA.  He returned on May 26-22 with worsening pain and shortness of breath and CT scan at that time revealed a larger pleural effusion with mild cardiomegaly.  That time he been having some chills but no fever and had no other symptoms.  He did have a mild cough that was nonproductive.  He reported the pain was a constant dull pain with intermittent sharp episodes of pain.  He had no history of cardiac disease and no history of DVTs or PE.  CT angiography chest was performed with no PE identified but patient had enlarging pleural effusion.  He was admitted to [facility] and underwent thoracentesis with interventional radiology.  Fluid studies at that time showed a nucleated cell count of 993 with 75% neutrophils and LDH of 3000.  Cultures initially did not show any infection.  Final culture results were not available at this time.  He was initially placed on vancomycin and Zosyn for antibiotic coverage.  Antibiotic was changed to clindamycin secondary to worsening renal function.  Renal function has been slowly improving since antibiotic regimen was changed.  On February 16, 2021 patient was continued as shortness of breath and requiring oxygen and repeat chest x-ray revealed an enlarging pleural effusion on the left again.  Patient was being followed by pulmonology at [facility] and they recommended patient be evaluated by cardiothoracic surgery for decortication. Pt was  transferred to [facility] for CT surgical eval  Assessment & Plan:   Principal Problem:   Pleural effusion on left Active Problems:   Schizoaffective disorder, bipolar type (HCC)   Polysubstance dependence including opioid type drug with complication, continuous use (HCC)   AKI (acute kidney injury) (HCC)   Essential hypertension   Pleural effusion    Pleural effusion on left -Pt is s/p thoracentesis twice by IR at [facility] with reaccumulation of left-sided pleural effusion.   -CTS was consulted. Recommendation for IR consultation for IR guided drainage, pending -continue with analgesia -Cont with clindamycin empirically  Active Problems:   Essential hypertension -BP stable at present -Cont on Avapro    AKI (acute kidney injury)  -Presenting Creatinine is 1.60 with BUN of 16 on labs from February 16, 2021.  Notes from physician at [facility] states that AKI has been improving. -Continued with IV fluid hydration with LR at 75 ml/hr    Schizoaffective disorder, bipolar type -Patient with history of schizoaffective disorder.   -Pt is continued on Seroquel since being at [facility]     Polysubstance dependence including opioid type drug with complication, continuous use -will need cessation     Tobacco use Nicotine patch provided to prevent nicotine withdrawls.    DVT prophylaxis: Lovenox subq Code Status: Full Family Communication: Pt in room, family at bedside  Status is: Inpatient  Remains inpatient appropriate because:Inpatient level of care appropriate due to severity of illness   Dispo: The patient is from: Home  Anticipated d/c is to: Home              Patient currently is not medically stable to d/c.   Difficult to place patient No       Consultants:   CTS  IR  Procedures:     Antimicrobials: Anti-infectives (From admission, onward)   Start     Dose/Rate Route Frequency Ordered Stop   02/16/21 2200  clindamycin  (CLEOCIN) IVPB 600 mg        600 mg 100 mL/hr over 30 Minutes Intravenous Every 8 hours 02/16/21 2057         Subjective: Without complaints  Objective: Vitals:   02/16/21 2000 02/17/21 0000 02/17/21 0337 02/17/21 1252  BP: (!) 155/89 129/62 (!) 150/77 (!) 139/96  Pulse: (!) 103 (!) 110 (!) 108 97  Resp: 20   17  Temp: 98.6 F (37 C) 99.1 F (37.3 C) 99 F (37.2 C) (!) 97.4 F (36.3 C)  TempSrc: Oral Oral Oral   SpO2:   90% 92%    Intake/Output Summary (Last 24 hours) at 02/17/2021 1805 Last data filed at 02/17/2021 1505 Gross per 24 hour  Intake 577 ml  Output 3100 ml  Net -2523 ml   There were no vitals filed for this visit.  Examination: General exam: Conversant, in no acute distress Respiratory system: normal chest rise, clear, no audible wheezing Cardiovascular system: regular rhythm, s1-s2 Gastrointestinal system: Nondistended, nontender, pos BS Central nervous system: No seizures, no tremors Extremities: No cyanosis, no joint deformities Skin: No rashes, no pallor Psychiatry: Affect normal // no auditory hallucinations   Data Reviewed: I have personally reviewed following labs and imaging studies  CBC: Recent Labs  Lab 02/16/21 2135 02/17/21 0145  WBC 8.3 7.5  HGB 9.7* 9.2*  HCT 29.9* 28.2*  MCV 89.8 89.2  PLT 431* 358   Basic Metabolic Panel: Recent Labs  Lab 02/16/21 2135 02/17/21 0145  NA  --  138  K  --  3.8  CL  --  98  CO2  --  30  GLUCOSE  --  101*  BUN  --  10  CREATININE 1.60* 1.57*  CALCIUM  --  8.7*   GFR: CrCl cannot be calculated (Unknown ideal weight.). Liver Function Tests: No results for input(s): AST, ALT, ALKPHOS, BILITOT, PROT, ALBUMIN in the last 168 hours. No results for input(s): LIPASE, AMYLASE in the last 168 hours. No results for input(s): AMMONIA in the last 168 hours. Coagulation Profile: No results for input(s): INR, PROTIME in the last 168 hours. Cardiac Enzymes: No results for input(s): CKTOTAL, CKMB,  CKMBINDEX, TROPONINI in the last 168 hours. BNP (last 3 results) No results for input(s): PROBNP in the last 8760 hours. HbA1C: No results for input(s): HGBA1C in the last 72 hours. CBG: No results for input(s): GLUCAP in the last 168 hours. Lipid Profile: No results for input(s): CHOL, HDL, LDLCALC, TRIG, CHOLHDL, LDLDIRECT in the last 72 hours. Thyroid Function Tests: No results for input(s): TSH, T4TOTAL, FREET4, T3FREE, THYROIDAB in the last 72 hours. Anemia Panel: No results for input(s): VITAMINB12, FOLATE, FERRITIN, TIBC, IRON, RETICCTPCT in the last 72 hours. Sepsis Labs: No results for input(s): PROCALCITON, LATICACIDVEN in the last 168 hours.  Recent Results (from the past 240 hour(s))  SARS CORONAVIRUS 2 (TAT 6-24 HRS) Nasopharyngeal Nasopharyngeal Swab     Status: None   Collection Time: 02/17/21 12:27 AM   Specimen: Nasopharyngeal Swab  Result Value Ref Range Status   SARS  Coronavirus 2 NEGATIVE NEGATIVE Final    Comment: (NOTE) SARS-CoV-2 target nucleic acids are NOT DETECTED.  The SARS-CoV-2 RNA is generally detectable in upper and lower respiratory specimens during the acute phase of infection. Negative results do not preclude SARS-CoV-2 infection, do not rule out co-infections with other pathogens, and should not be used as the sole basis for treatment or other patient management decisions. Negative results must be combined with clinical observations, patient history, and epidemiological information. The expected result is Negative.  Fact Sheet for Patients: HairSlick.no  Fact Sheet for Healthcare Providers: quierodirigir.com  This test is not yet approved or cleared by the [place] FDA and  has been authorized for detection and/or diagnosis of SARS-CoV-2 by FDA under an Emergency Use Authorization (EUA). This EUA will remain  in effect (meaning this test can be used) for the duration of  the COVID-19 declaration under Se ction 564(b)(1) of the Act, 21 U.S.C. section 360bbb-3(b)(1), unless the authorization is terminated or revoked sooner.  Performed at [facility], [address] [place], [place] [address]      Radiology Studies: No results found.  Scheduled Meds: . enoxaparin (LOVENOX) injection  40 mg Subcutaneous Daily  . irbesartan  150 mg Oral Daily  . nicotine  21 mg Transdermal Daily  . QUEtiapine  50 mg Oral QHS  . sodium chloride flush  3 mL Intravenous Q12H   Continuous Infusions: . sodium chloride    . clindamycin (CLEOCIN) IV 600 mg (02/17/21 1351)  . lactated ringers 75 mL/hr at 02/17/21 0934     LOS: 1 day   Rickey Barbara, MD [facility] Pager On Amion  If 7PM-7AM, please contact night-coverage 02/17/2021, 6:05 PM

## 2021-02-17 NOTE — Progress Notes (Signed)
During pt admittion pt and spouse was educated on the hospital policies on drug abuse and pt safety. The importance on refraining from using drugs while in the hospital and what will happen if there is any suspicions of drug abuse on hospital grounds.

## 2021-02-17 NOTE — Consult Note (Signed)
TCTS Preliminary Consult Note  Asked to see this 40 yo man transferred to [facility] for possible pulm decortication for left empyema. He presented with left chest pain. Has an extensive history of substance abuse.   I have personally reviewed his chest CT from yesterday. This shows loculated effusion at inferior aspect of left chest. I would suggest IR/image-guided drainage rather than going directly to surgery.   Full consult note to follow. Shakinah Navis Z. Vickey Sages, MD [phone]

## 2021-02-17 NOTE — Plan of Care (Signed)

## 2021-02-18 ENCOUNTER — Inpatient Hospital Stay (HOSPITAL_COMMUNITY): Payer: Self-pay

## 2021-02-18 ENCOUNTER — Encounter (HOSPITAL_COMMUNITY): Payer: Self-pay | Admitting: Family Medicine

## 2021-02-18 LAB — COMPREHENSIVE METABOLIC PANEL
ALT: 22 U/L (ref 0–44)
AST: 24 U/L (ref 15–41)
Albumin: 2.5 g/dL — ABNORMAL LOW (ref 3.5–5.0)
Alkaline Phosphatase: 90 U/L (ref 38–126)
Anion gap: 11 (ref 5–15)
BUN: 12 mg/dL (ref 6–20)
CO2: 31 mmol/L (ref 22–32)
Calcium: 9.3 mg/dL (ref 8.9–10.3)
Chloride: 94 mmol/L — ABNORMAL LOW (ref 98–111)
Creatinine, Ser: 1.42 mg/dL — ABNORMAL HIGH (ref 0.61–1.24)
GFR, Estimated: >60 mL/min (ref >60)
Glucose, Bld: 109 mg/dL — ABNORMAL HIGH (ref 70–99)
Potassium: 4.3 mmol/L (ref 3.5–5.1)
Sodium: 136 mmol/L (ref 135–145)
Total Bilirubin: <0.1 mg/dL — ABNORMAL LOW (ref 0.3–1.2)
Total Protein: 7.1 g/dL (ref 6.5–8.1)

## 2021-02-18 LAB — CBC
HCT: 31.1 % — ABNORMAL LOW (ref 39.0–52.0)
Hemoglobin: 10.3 g/dL — ABNORMAL LOW (ref 13.0–17.0)
MCH: 29.3 pg (ref 26.0–34.0)
MCHC: 33.1 g/dL (ref 30.0–36.0)
MCV: 88.6 fL (ref 80.0–100.0)
Platelets: 388 10*3/uL (ref 150–400)
RBC: 3.51 MIL/uL — ABNORMAL LOW (ref 4.22–5.81)
RDW: 13.2 % (ref 11.5–15.5)
WBC: 9 10*3/uL (ref 4.0–10.5)
nRBC: 0 % (ref 0.0–0.2)

## 2021-02-18 MED ORDER — LIDOCAINE HCL 1 % IJ SOLN
INTRAMUSCULAR | Status: AC
  Filled 2021-02-18: qty 20

## 2021-02-18 MED ORDER — MIDAZOLAM HCL 2 MG/2ML IJ SOLN
INTRAMUSCULAR | Status: AC
  Filled 2021-02-18: qty 4

## 2021-02-18 MED ORDER — FENTANYL CITRATE (PF) 100 MCG/2ML IJ SOLN
INTRAMUSCULAR | Status: AC
  Filled 2021-02-18: qty 4

## 2021-02-18 MED ORDER — PERMETHRIN 5 % EX CREA
TOPICAL_CREAM | Freq: Once | CUTANEOUS | Status: AC
  Filled 2021-02-18: qty 60

## 2021-02-18 MED ORDER — MIDAZOLAM HCL 2 MG/2ML IJ SOLN
INTRAMUSCULAR | Status: AC | PRN
  Administered 2021-02-18 (×3): 1 mg via INTRAVENOUS
  Administered 2021-02-18: 2 mg via INTRAVENOUS

## 2021-02-18 MED ORDER — FENTANYL CITRATE (PF) 100 MCG/2ML IJ SOLN
INTRAMUSCULAR | Status: AC | PRN
  Administered 2021-02-18 (×4): 50 ug via INTRAVENOUS

## 2021-02-18 MED ORDER — LIDOCAINE HCL 1 % IJ SOLN
INTRAMUSCULAR | Status: AC | PRN
  Administered 2021-02-18: 10 mL

## 2021-02-18 MED ORDER — SODIUM CHLORIDE 0.9 % IV SOLN
INTRAVENOUS | Status: AC | PRN
  Administered 2021-02-18: 250 mL via INTRAVENOUS

## 2021-02-18 MED ORDER — MIDAZOLAM HCL 2 MG/2ML IJ SOLN
INTRAMUSCULAR | Status: AC
  Filled 2021-02-18: qty 2

## 2021-02-18 MED ORDER — FENTANYL CITRATE (PF) 100 MCG/2ML IJ SOLN
INTRAMUSCULAR | Status: AC
  Filled 2021-02-18: qty 2

## 2021-02-18 NOTE — Progress Notes (Signed)
During morning assessment, patient was asked orientation questions. Patient answered name, location correct, but when it came to asking for the year and who the president is patient giggled and stated, "I don't know." Therefore, questions were asked again, a voice came from pt.'s cell phone and answered the questions that were asked. It was explained to patient that this is a serious matter, it is important for the healthcare team to know if pt. Is alert and oriented or confused to be able to provide the best care. Pt. Again giggled. Patient was also educated that his current diet is NPO, it was explained what this means. Table area was cleaned off, drinks were moved away from patient's reach and again was reiterated that he can not eat or drink.   9:00am: Went back to check on patient and patient has visitor states it's his wife. There was [employer] food and drink on the bedside take that had not yet been opened. I asked patient if he were planning on eating, pt said no. I again told patient and visitor the importance of not eating due to NPO diet status and the potential of effecting healthcare decisions, etc. Pt. [place] Spaniel my wife didn't know. I explained to patient that that's understandable, but please do not eat or drink anything. MD made aware.  9:20am: Went back to check on patient, pt. Resting in bed and food and drinks at the sink, neither has been touched. Bag still intact and drinks have not been drank from.

## 2021-02-18 NOTE — Progress Notes (Signed)
PROGRESS NOTE    John [name]  NUU:[id number] DOB: [birth year] DOA: 02/16/2021 PCP: Patient, No Pcp Per (Inactive)    Brief Narrative:  40 y.o. male with medical history significant for substance abuse, affective disorder, previous stroke secondary to drug use, hepatitis C. He was admitted to [facility] on 02/09/21 with a large left-sided pleural effusion.  Reported symptoms started on 02/06/21 with left-sided lateral chest wall pain with shortness of breath.  He had been in the emergency room the day before admission but left AMA.  He returned on May 26-22 with worsening pain and shortness of breath and CT scan at that time revealed a larger pleural effusion with mild cardiomegaly.  That time he been having some chills but no fever and had no other symptoms.  He did have a mild cough that was nonproductive.  He reported the pain was a constant dull pain with intermittent sharp episodes of pain.  He had no history of cardiac disease and no history of DVTs or PE.  CT angiography chest was performed with no PE identified but patient had enlarging pleural effusion.  He was admitted to [facility] and underwent thoracentesis with interventional radiology.  Fluid studies at that time showed a nucleated cell count of 993 with 75% neutrophils and LDH of 3000.  Cultures initially did not show any infection.  Final culture results were not available at this time.  He was initially placed on vancomycin and Zosyn for antibiotic coverage.  Antibiotic was changed to clindamycin secondary to worsening renal function.  Renal function has been slowly improving since antibiotic regimen was changed.  On February 16, 2021 patient was continued as shortness of breath and requiring oxygen and repeat chest x-ray revealed an enlarging pleural effusion on the left again.  Patient was being followed by pulmonology at [facility] and they recommended patient be evaluated by cardiothoracic surgery for decortication. Pt was  transferred to [facility] for CT surgical eval  Assessment & Plan:   Principal Problem:   Pleural effusion on left Active Problems:   Schizoaffective disorder, bipolar type (HCC)   Polysubstance dependence including opioid type drug with complication, continuous use (HCC)   AKI (acute kidney injury) (HCC)   Essential hypertension   Pleural effusion    Pleural effusion on left -Pt is s/p thoracentesis twice by IR at [facility] with reaccumulation of left-sided pleural effusion.   -CTS was consulted. Recommendation for IR consultation for IR guided drainage, pending - Seen by IR and pt is now s/p CT guided L thoracostomy tube placement 6/4 with fluid sent for culture  Active Problems:   Essential hypertension -BP stable at present -Cont on Avapro as tolerated    AKI (acute kidney injury)  -Presenting Creatinine is 1.60 with BUN of 16 on labs from February 16, 2021.  Notes from physician at [facility] states that AKI has been improving. -Pt was continued with IV fluid hydration with LR at 75 ml/hr    Schizoaffective disorder, bipolar type -Patient with history of schizoaffective disorder.   -Pt is continued on Seroquel since being at [facility]     Polysubstance dependence including opioid type drug with complication, continuous use -will need cessation     Tobacco use Nicotine patch provided to prevent nicotine withdrawls.    DVT prophylaxis: Lovenox subq Code Status: Full Family Communication: Pt in room, family at bedside  Status is: Inpatient  Remains inpatient appropriate because:Inpatient level of care appropriate due to severity of illness  Dispo:  The patient is from: Home              Anticipated d/c is to: Home              Patient currently is not medically stable to d/c.   Difficult to place patient No  Consultants:   CTS  IR  Procedures:     Antimicrobials: Anti-infectives (From admission, onward)   Start     Dose/Rate Route  Frequency Ordered Stop   02/16/21 2200  clindamycin (CLEOCIN) IVPB 600 mg        600 mg 100 mL/hr over 30 Minutes Intravenous Every 8 hours 02/16/21 2057        Subjective: Asking about going home soon  Objective: Vitals:   02/18/21 1317 02/18/21 1340 02/18/21 1421 02/18/21 1515  BP: 124/84 124/71 137/72 125/66  Pulse: 88 90 100 97  Resp: 17 16 18 15   Temp: 97.7 F (36.5 C) 98.3 F (36.8 C) 98.4 F (36.9 C)   TempSrc: Oral Oral Oral   SpO2: 94% 96% 94% (!) 88%    Intake/Output Summary (Last 24 hours) at 02/18/2021 1644 Last data filed at 02/18/2021 1515 Gross per 24 hour  Intake 200 ml  Output 3237 ml  Net -3037 ml   There were no vitals filed for this visit.  Examination: General exam: Awake, laying in bed, in nad Respiratory system: Normal respiratory effort, no wheezing Cardiovascular system: regular rate, s1, s2 Gastrointestinal system: Soft, nondistended, positive BS Central nervous system: CN2-12 grossly intact, strength intact Extremities: Perfused, no clubbing Skin: Normal skin turgor, no notable skin lesions seen Psychiatry: Mood normal // no visual hallucinations   Data Reviewed: I have personally reviewed following labs and imaging studies  CBC: Recent Labs  Lab 02/16/21 2135 02/17/21 0145 02/18/21 0233  WBC 8.3 7.5 9.0  HGB 9.7* 9.2* 10.3*  HCT 29.9* 28.2* 31.1*  MCV 89.8 89.2 88.6  PLT 431* 358 388   Basic Metabolic Panel: Recent Labs  Lab 02/16/21 2135 02/17/21 0145 02/18/21 0233  NA  --  138 136  K  --  3.8 4.3  CL  --  98 94*  CO2  --  30 31  GLUCOSE  --  101* 109*  BUN  --  10 12  CREATININE 1.60* 1.57* 1.42*  CALCIUM  --  8.7* 9.3   GFR: CrCl cannot be calculated (Unknown ideal weight.). Liver Function Tests: Recent Labs  Lab 02/18/21 0233  AST 24  ALT 22  ALKPHOS 90  BILITOT <0.1*  PROT 7.1  ALBUMIN 2.5*   No results for input(s): LIPASE, AMYLASE in the last 168 hours. No results for input(s): AMMONIA in the last 168  hours. Coagulation Profile: No results for input(s): INR, PROTIME in the last 168 hours. Cardiac Enzymes: No results for input(s): CKTOTAL, CKMB, CKMBINDEX, TROPONINI in the last 168 hours. BNP (last 3 results) No results for input(s): PROBNP in the last 8760 hours. HbA1C: No results for input(s): HGBA1C in the last 72 hours. CBG: No results for input(s): GLUCAP in the last 168 hours. Lipid Profile: No results for input(s): CHOL, HDL, LDLCALC, TRIG, CHOLHDL, LDLDIRECT in the last 72 hours. Thyroid Function Tests: No results for input(s): TSH, T4TOTAL, FREET4, T3FREE, THYROIDAB in the last 72 hours. Anemia Panel: No results for input(s): VITAMINB12, FOLATE, FERRITIN, TIBC, IRON, RETICCTPCT in the last 72 hours. Sepsis Labs: No results for input(s): PROCALCITON, LATICACIDVEN in the last 168 hours.  Recent Results (from the past 240 hour(s))  SARS CORONAVIRUS 2 (TAT 6-24 HRS) Nasopharyngeal Nasopharyngeal Swab     Status: None   Collection Time: 02/17/21 12:27 AM   Specimen: Nasopharyngeal Swab  Result Value Ref Range Status   SARS Coronavirus 2 NEGATIVE NEGATIVE Final    Comment: (NOTE) SARS-CoV-2 target nucleic acids are NOT DETECTED.  The SARS-CoV-2 RNA is generally detectable in upper and lower respiratory specimens during the acute phase of infection. Negative results do not preclude SARS-CoV-2 infection, do not rule out co-infections with other pathogens, and should not be used as the sole basis for treatment or other patient management decisions. Negative results must be combined with clinical observations, patient history, and epidemiological information. The expected result is Negative.  Fact Sheet for Patients: HairSlick.no  Fact Sheet for Healthcare Providers: quierodirigir.com  This test is not yet approved or cleared by the [place] FDA and  has been authorized for detection and/or diagnosis of SARS-CoV-2  by FDA under an Emergency Use Authorization (EUA). This EUA will remain  in effect (meaning this test can be used) for the duration of the COVID-19 declaration under Se ction 564(b)(1) of the Act, 21 U.S.C. section 360bbb-3(b)(1), unless the authorization is terminated or revoked sooner.  Performed at [facility], [address] [place], [place] [address]      Radiology Studies: CT IMAGE GUIDED DRAINAGE BY PERCUTANEOUS CATHETER  Result Date: 02/18/2021 CLINICAL DATA:  Loculated left pleural fluid collection/empyema requiring catheter drainage. EXAM: CT GUIDED LEFT THORACOSTOMY TUBE PLACEMENT ANESTHESIA/SEDATION: 5.0 mg IV Versed 200 mcg IV Fentanyl Total Moderate Sedation Time:  29 minutes The patient's level of consciousness and physiologic status were continuously monitored during the procedure by Radiology nursing. PROCEDURE: The procedure, risks, benefits, and alternatives were explained to the patient. Questions regarding the procedure were encouraged and answered. The patient understands and consents to the procedure. A time out was performed prior to initiating the procedure. CT was performed in a supine oblique position through the chest followed by a decubitus position with the left side up. The left posterior chest wall was prepped with chlorhexidine in a sterile fashion, and a sterile drape was applied covering the operative field. A sterile gown and sterile gloves were used for the procedure. Local anesthesia was provided with 1% Lidocaine. Under CT guidance, an 18 gauge trocar needle was advanced into the left posterior pleural space. After confirming needle tip position, a guidewire was advanced. The tract was dilated and a 12 French percutaneous drainage catheter advanced. A fluid sample was withdrawn and sent for culture analysis. CT was performed to confirm catheter position. The catheter was connected to a [place] Pleur-evac device and secured at the skin with a Prolene  retention suture and StatLock device. COMPLICATIONS: None FINDINGS: Residual posterior loculated component left pleural fluid was targeted for catheter placement. There was return of clear, yellow fluid. The Pleur-evac device will be connected to wall suction at -20 cm of water. IMPRESSION: Left thoracostomy tube placement for within residual loculated posterior left pleural effusion/empyema. A 12 French drainage catheter was placed and attached to a Pleur-evac device which will be connected to wall suction. A sample of fluid was sent for culture analysis. Electronically Signed   By: Irish Lack M.D.   On: 02/18/2021 13:16    Scheduled Meds: . enoxaparin (LOVENOX) injection  40 mg Subcutaneous Daily  . irbesartan  150 mg Oral Daily  . nicotine  21 mg Transdermal Daily  . QUEtiapine  50 mg Oral QHS  . sodium  chloride flush  3 mL Intravenous Q12H   Continuous Infusions: . sodium chloride    . clindamycin (CLEOCIN) IV 600 mg (02/18/21 1513)  . lactated ringers 75 mL/hr at 02/17/21 0934     LOS: 2 days   Rickey Barbara, MD [facility] Pager On Amion  If 7PM-7AM, please contact night-coverage 02/18/2021, 4:44 PM

## 2021-02-18 NOTE — Plan of Care (Signed)

## 2021-02-18 NOTE — Procedures (Signed)
Interventional Radiology Procedure Note  Procedure: CT guided left thoracostomy tube placement  Complications: None  Estimated Blood Loss: < 10 mL  Findings: 12 Fr pigtail drain placed in residual loculated posterior left pleural fluid collection/empyema. Sample of clear, yellowish fluid sent for culture. Tube connected to [place] pleur-evac.  Jodi Marble. Fredia Sorrow, M.D Pager:  [phone]

## 2021-02-18 NOTE — Consult Note (Signed)
Chief Complaint: Patient was seen in consultation today for loculated left pleural effusion/left chest tube placement.  Referring Physician(s): Jerald Kief [facility])  Supervising Physician: Irish Lack  Patient Status: [facility] - In-pt  History of Present Illness: John [name] is a 40 y.o. male with a past medical history of CVA, seizures, asthma, Hepatitis C, depression, anxiety, affective disorder, and drug abuse. He was transferred to [facility] from [facility] for escalation of care/management of recurrent left pleural effusion. While at [facility], he did undergo left thoracentesis however fluid reaccumulated and is now loculated. TCTS was consulted who recommended IR consult for possible left chest tube placement.  IR consulted by Dr. Rhona Leavens for possible image-guided left chest tube placement. Patient awake and alert laying in bed with no complaints at this time. Girlfriend at bedside. Denies fever, chills, chest pain, dyspnea, abdominal pain, or headache.   Past Medical History:  Diagnosis Date  . Anxiety   . Asthma   . CVA (cerebral infarction)    drug induced  . Depression   . Drug addiction (HCC)   . Hepatitis C   . Seizures (HCC)    drug induced    Past Surgical History:  Procedure Laterality Date  . ABDOMINAL SURGERY    . INCISION AND DRAINAGE OF WOUND Left 09/22/2017   Procedure: IRRIGATION AND DEBRIDEMENT WOUND LEFT ELBOW ANTECUBITAL SPACE;  Surgeon: Teryl Lucy, MD;  Location: [facility] ORS;  Service: Orthopedics;  Laterality: Left;    Allergies: Haldol [haloperidol lactate], Haloperidol, Tylenol [acetaminophen], and Risperidone  Medications: Prior to Admission medications   Medication Sig Start Date End Date Taking? Authorizing Provider  nicotine (NICODERM CQ - DOSED IN MG/24 HOURS) 21 mg/24hr patch Place 1 patch (21 mg total) onto the skin daily. 07/28/19  Yes Aldean Baker, NP  ARIPiprazole (ABILIFY) 10 MG tablet Take 1 tablet (10 mg total) by mouth at bedtime. Patient not  taking: Reported on 02/16/2021 07/27/19   Aldean Baker, NP  FLUoxetine (PROZAC) 20 MG capsule Take 1 capsule (20 mg total) by mouth daily. Patient not taking: Reported on 02/16/2021 07/27/19   Aldean Baker, NP  gabapentin (NEURONTIN) 300 MG capsule Take 1 capsule (300 mg total) by mouth 3 (three) times daily. Patient not taking: Reported on 02/16/2021 07/27/19   Aldean Baker, NP  hydrOXYzine (ATARAX/VISTARIL) 25 MG tablet Take 1 tablet (25 mg total) by mouth 3 (three) times daily as needed for anxiety. Patient not taking: Reported on 02/16/2021 07/27/19   Aldean Baker, NP  loratadine (CLARITIN) 10 MG tablet Take 1 tablet (10 mg total) by mouth daily. Patient not taking: Reported on 02/16/2021 07/28/19   Aldean Baker, NP  neomycin-bacitracin-polymyxin (NEOSPORIN) ointment Apply topically 2 (two) times daily. Patient not taking: Reported on 02/16/2021 07/27/19   Aldean Baker, NP  pantoprazole (PROTONIX) 40 MG tablet Take 1 tablet (40 mg total) by mouth daily. Patient not taking: Reported on 02/16/2021 07/28/19   Aldean Baker, NP  sodium chloride (OCEAN) 0.65 % SOLN nasal spray Place 1 spray into both nostrils as needed for congestion. Patient not taking: Reported on 02/16/2021 07/27/19   Aldean Baker, NP  traZODone (DESYREL) 150 MG tablet Take 1 tablet (150 mg total) by mouth at bedtime as needed for sleep. Patient not taking: Reported on 02/16/2021 07/27/19   Aldean Baker, NP     Family History  Problem Relation Age of Onset  . Diabetes Paternal Uncle   . Diabetes Paternal Grandmother   .  Depression Mother   . Depression Father   . Alcoholism Other     Social History   Socioeconomic History  . Marital status: Single    Spouse name: Not on file  . Number of children: Not on file  . Years of education: Not on file  . Highest education level: Not on file  Occupational History  . Not on file  Tobacco Use  . Smoking status: Current Every Day Smoker    Packs/day: 2.00    Types: Cigarettes   . Smokeless tobacco: Never Used  Substance and Sexual Activity  . Alcohol use: Yes    Alcohol/week: 1.0 standard drink    Types: 1 Cans of beer per week    Comment: one cse beer daily  . Drug use: Yes    Types: Marijuana, Heroin    Comment: Opana, heroin, and pain medications  . Sexual activity: Not on file  Other Topics Concern  . Not on file  Social History Narrative  . Not on file   Social Determinants of Health   Financial Resource Strain: Not on file  Food Insecurity: Not on file  Transportation Needs: Not on file  Physical Activity: Not on file  Stress: Not on file  Social Connections: Not on file     Review of Systems: A 12 point ROS discussed and pertinent positives are indicated in the HPI above.  All other systems are negative.  Review of Systems  Constitutional: Negative for chills and fever.  Respiratory: Negative for shortness of breath and wheezing.   Cardiovascular: Negative for chest pain and palpitations.  Gastrointestinal: Negative for abdominal pain.  Neurological: Negative for headaches.  Psychiatric/Behavioral: Negative for confusion.    Vital Signs: BP (!) 150/90 (BP Location: Right Arm)   Pulse (!) 101   Temp 98.7 F (37.1 C) (Oral)   Resp 20   SpO2 91%   Physical Exam Vitals and nursing note reviewed.  Constitutional:      General: He is not in acute distress. Cardiovascular:     Rate and Rhythm: Normal rate and regular rhythm.     Heart sounds: Normal heart sounds. No murmur heard.   Pulmonary:     Effort: Pulmonary effort is normal. No respiratory distress.     Breath sounds: No wheezing.     Comments: Decreased breath sounds on left. On [place]. Skin:    General: Skin is warm and dry.  Neurological:     Mental Status: He is alert and oriented to person, place, and time.      MD Evaluation Airway: WNL Heart: WNL Abdomen: WNL Chest/ Lungs: WNL ASA  Classification: 3 Mallampati/Airway Score: Two   Imaging: No results  found.  Labs:  CBC: Recent Labs    02/16/21 2135 02/17/21 0145 02/18/21 0233  WBC 8.3 7.5 9.0  HGB 9.7* 9.2* 10.3*  HCT 29.9* 28.2* 31.1*  PLT 431* 358 388    COAGS: No results for input(s): INR, APTT in the last 8760 hours.  BMP: Recent Labs    02/16/21 2135 02/17/21 0145 02/18/21 0233  NA  --  138 136  K  --  3.8 4.3  CL  --  98 94*  CO2  --  30 31  GLUCOSE  --  101* 109*  BUN  --  10 12  CALCIUM  --  8.7* 9.3  CREATININE 1.60* 1.57* 1.42*  GFRNONAA 57* 58* >60    LIVER FUNCTION TESTS: Recent Labs    02/18/21 0233  BILITOT <0.1*  AST 24  ALT 22  ALKPHOS 90  PROT 7.1  ALBUMIN 2.5*     Assessment and Plan:  Loculated left pleural effusion. Plan for image-guided left chest tube placement today in IR. Patient is NPO. Afebrile. COVID negative yesterday.  Risks and benefits discussed with the patient including bleeding, infection, damage to adjacent structures, and sepsis. All of the patient's questions were answered, patient is agreeable to proceed. Consent signed and in chart.   Thank you for this interesting consult.  I greatly enjoyed meeting Dorance Spink and look forward to participating in their care.  A copy of this report was sent to the requesting provider on this date.  Electronically Signed: Elwin Mocha, PA-C 02/18/2021, 9:42 AM   I spent a total of 20 Minutes in face to face in clinical consultation, greater than 50% of which was counseling/coordinating care for loculated left pleural effusion/left chest tube placement.

## 2021-02-19 ENCOUNTER — Inpatient Hospital Stay (HOSPITAL_COMMUNITY): Payer: Self-pay

## 2021-02-19 DIAGNOSIS — Z9689 Presence of other specified functional implants: Secondary | ICD-10-CM

## 2021-02-19 LAB — COMPREHENSIVE METABOLIC PANEL
ALT: 27 U/L (ref 0–44)
AST: 27 U/L (ref 15–41)
Albumin: 2.5 g/dL — ABNORMAL LOW (ref 3.5–5.0)
Alkaline Phosphatase: 91 U/L (ref 38–126)
Anion gap: 11 (ref 5–15)
BUN: 16 mg/dL (ref 6–20)
CO2: 30 mmol/L (ref 22–32)
Calcium: 9.1 mg/dL (ref 8.9–10.3)
Chloride: 96 mmol/L — ABNORMAL LOW (ref 98–111)
Creatinine, Ser: 1.41 mg/dL — ABNORMAL HIGH (ref 0.61–1.24)
GFR, Estimated: >60 mL/min (ref >60)
Glucose, Bld: 143 mg/dL — ABNORMAL HIGH (ref 70–99)
Potassium: 4 mmol/L (ref 3.5–5.1)
Sodium: 137 mmol/L (ref 135–145)
Total Bilirubin: 0.4 mg/dL (ref 0.3–1.2)
Total Protein: 6.8 g/dL (ref 6.5–8.1)

## 2021-02-19 LAB — CBC
HCT: 31.9 % — ABNORMAL LOW (ref 39.0–52.0)
Hemoglobin: 10.4 g/dL — ABNORMAL LOW (ref 13.0–17.0)
MCH: 29 pg (ref 26.0–34.0)
MCHC: 32.6 g/dL (ref 30.0–36.0)
MCV: 88.9 fL (ref 80.0–100.0)
Platelets: 416 10*3/uL — ABNORMAL HIGH (ref 150–400)
RBC: 3.59 MIL/uL — ABNORMAL LOW (ref 4.22–5.81)
RDW: 13.2 % (ref 11.5–15.5)
WBC: 8.4 10*3/uL (ref 4.0–10.5)
nRBC: 0 % (ref 0.0–0.2)

## 2021-02-19 MED ORDER — GABAPENTIN 300 MG PO CAPS: 300.0000 mg | ORAL_CAPSULE | Freq: Three times a day (TID) | ORAL | Status: DC

## 2021-02-19 MED ORDER — HYDROXYZINE HCL 25 MG PO TABS: 25.0000 mg | ORAL_TABLET | Freq: Three times a day (TID) | ORAL | Status: DC | PRN

## 2021-02-19 MED ORDER — FLUOXETINE HCL 20 MG PO CAPS
20.0000 mg | ORAL_CAPSULE | Freq: Every day | ORAL | Status: DC
  Administered 2021-02-19 – 2021-02-20 (×2): 20 mg via ORAL
  Filled 2021-02-19 (×2): qty 1

## 2021-02-19 MED ORDER — OXYCODONE HCL 5 MG PO TABS
5.0000 mg | ORAL_TABLET | ORAL | Status: DC | PRN
  Administered 2021-02-19 – 2021-02-22 (×15): 5 mg via ORAL
  Filled 2021-02-19 (×16): qty 1

## 2021-02-19 MED ORDER — HYDROXYZINE HCL 25 MG PO TABS
25.0000 mg | ORAL_TABLET | Freq: Three times a day (TID) | ORAL | Status: DC | PRN
  Administered 2021-02-19 – 2021-02-22 (×8): 25 mg via ORAL
  Filled 2021-02-19 (×8): qty 1

## 2021-02-19 NOTE — Progress Notes (Signed)
Referring Physician(s): Jerald Kief [facility])  Supervising Physician: Irish Lack  Patient Status:  [facility] - In-pt  Chief Complaint:  Loculated left pleural effusion s/p left chest tube placement in IR 02/18/2021.  Subjective:  Patient awake and alert laying in bed. Significant other at bedside, additional family member on speaker phone as well (unsure who). Complains of tube tenderness (as expected), asking for pain medications. Left chest tube site c/d/i. On RA.   Allergies: Haldol [haloperidol lactate], Haloperidol, Tylenol [acetaminophen], and Risperidone  Medications: Prior to Admission medications   Medication Sig Start Date End Date Taking? Authorizing Provider  nicotine (NICODERM CQ - DOSED IN MG/24 HOURS) 21 mg/24hr patch Place 1 patch (21 mg total) onto the skin daily. 07/28/19  Yes Aldean Baker, NP  ARIPiprazole (ABILIFY) 10 MG tablet Take 1 tablet (10 mg total) by mouth at bedtime. Patient not taking: Reported on 02/16/2021 07/27/19   Aldean Baker, NP  FLUoxetine (PROZAC) 20 MG capsule Take 1 capsule (20 mg total) by mouth daily. Patient not taking: Reported on 02/16/2021 07/27/19   Aldean Baker, NP  gabapentin (NEURONTIN) 300 MG capsule Take 1 capsule (300 mg total) by mouth 3 (three) times daily. Patient not taking: Reported on 02/16/2021 07/27/19   Aldean Baker, NP  hydrOXYzine (ATARAX/VISTARIL) 25 MG tablet Take 1 tablet (25 mg total) by mouth 3 (three) times daily as needed for anxiety. Patient not taking: Reported on 02/16/2021 07/27/19   Aldean Baker, NP  loratadine (CLARITIN) 10 MG tablet Take 1 tablet (10 mg total) by mouth daily. Patient not taking: Reported on 02/16/2021 07/28/19   Aldean Baker, NP  neomycin-bacitracin-polymyxin (NEOSPORIN) ointment Apply topically 2 (two) times daily. Patient not taking: Reported on 02/16/2021 07/27/19   Aldean Baker, NP  pantoprazole (PROTONIX) 40 MG tablet Take 1 tablet (40 mg total) by mouth daily. Patient not  taking: Reported on 02/16/2021 07/28/19   Aldean Baker, NP  sodium chloride (OCEAN) 0.65 % SOLN nasal spray Place 1 spray into both nostrils as needed for congestion. Patient not taking: Reported on 02/16/2021 07/27/19   Aldean Baker, NP  traZODone (DESYREL) 150 MG tablet Take 1 tablet (150 mg total) by mouth at bedtime as needed for sleep. Patient not taking: Reported on 02/16/2021 07/27/19   Aldean Baker, NP     Vital Signs: BP 138/86 (BP Location: Right Arm)   Pulse 98   Temp 97.8 F (36.6 C) (Oral)   Resp 13   SpO2 93%   Physical Exam Vitals and nursing note reviewed.  Constitutional:      General: He is not in acute distress. Pulmonary:     Effort: Pulmonary effort is normal. No respiratory distress.     Comments: Sating 94% on RA. Left chest tube site without tenderness, erythema, drainage, or active bleeding; approximately 80 cc serous fluid in pleure-vac; tube to water seal with (-) air leak. Skin:    General: Skin is warm and dry.  Neurological:     Mental Status: He is alert and oriented to person, place, and time.     Imaging: CT IMAGE GUIDED DRAINAGE BY PERCUTANEOUS CATHETER  Result Date: 02/18/2021 CLINICAL DATA:  Loculated left pleural fluid collection/empyema requiring catheter drainage. EXAM: CT GUIDED LEFT THORACOSTOMY TUBE PLACEMENT ANESTHESIA/SEDATION: 5.0 mg IV Versed 200 mcg IV Fentanyl Total Moderate Sedation Time:  29 minutes The patient's level of consciousness and physiologic status were continuously monitored during the procedure by Radiology nursing.  PROCEDURE: The procedure, risks, benefits, and alternatives were explained to the patient. Questions regarding the procedure were encouraged and answered. The patient understands and consents to the procedure. A time out was performed prior to initiating the procedure. CT was performed in a supine oblique position through the chest followed by a decubitus position with the left side up. The left posterior chest  wall was prepped with chlorhexidine in a sterile fashion, and a sterile drape was applied covering the operative field. A sterile gown and sterile gloves were used for the procedure. Local anesthesia was provided with 1% Lidocaine. Under CT guidance, an 18 gauge trocar needle was advanced into the left posterior pleural space. After confirming needle tip position, a guidewire was advanced. The tract was dilated and a 12 French percutaneous drainage catheter advanced. A fluid sample was withdrawn and sent for culture analysis. CT was performed to confirm catheter position. The catheter was connected to a Sahara Pleur-evac device and secured at the skin with a Prolene retention suture and StatLock device. COMPLICATIONS: None FINDINGS: Residual posterior loculated component left pleural fluid was targeted for catheter placement. There was return of clear, yellow fluid. The Pleur-evac device will be connected to wall suction at -20 cm of water. IMPRESSION: Left thoracostomy tube placement for within residual loculated posterior left pleural effusion/empyema. A 12 French drainage catheter was placed and attached to a Pleur-evac device which will be connected to wall suction. A sample of fluid was sent for culture analysis. Electronically Signed   By: Irish Lack M.D.   On: 02/18/2021 13:16    Labs:  CBC: Recent Labs    02/16/21 2135 02/17/21 0145 02/18/21 0233 02/19/21 0212  WBC 8.3 7.5 9.0 8.4  HGB 9.7* 9.2* 10.3* 10.4*  HCT 29.9* 28.2* 31.1* 31.9*  PLT 431* 358 388 416*    COAGS: No results for input(s): INR, APTT in the last 8760 hours.  BMP: Recent Labs    02/16/21 2135 02/17/21 0145 02/18/21 0233 02/19/21 0212  NA  --  138 136 137  K  --  3.8 4.3 4.0  CL  --  98 94* 96*  CO2  --  30 31 30   GLUCOSE  --  101* 109* 143*  BUN  --  10 12 16   CALCIUM  --  8.7* 9.3 9.1  CREATININE 1.60* 1.57* 1.42* 1.41*  GFRNONAA 57* 58* >60 >60    LIVER FUNCTION TESTS: Recent Labs     02/18/21 0233 02/19/21 0212  BILITOT <0.1* 0.4  AST 24 27  ALT 22 27  ALKPHOS 90 91  PROT 7.1 6.8  ALBUMIN 2.5* 2.5*    Assessment and Plan:  Loculated left pleural effusion s/p left chest tube placement in IR 02/18/2021. Left chest tube stable with approximately 80 cc serous fluid in pleure-vac, tube to water seal with (-) air leak. Continue current chest tube management- tube to water seal, continue with Qshift monitor of output and routine CXR (CXR for today pending). Further plans per [facility]- appreciate and agree with management. IR to follow.   Electronically Signed: 04/20/2021, PA-C 02/19/2021, 9:43 AM   I spent a total of 15 Minutes at the the patient's bedside AND on the patient's hospital floor or unit, greater than 50% of which was counseling/coordinating care for loculated left pleural effusion s/p left chest tube placement.

## 2021-02-19 NOTE — Progress Notes (Signed)
PROGRESS NOTE    John [name]  GMW:[id number] DOB: 1981-01-19 DOA: 02/16/2021 PCP: Patient, No Pcp Per (Inactive)    Brief Narrative:  40 y.o. male with medical history significant for substance abuse, affective disorder, previous stroke secondary to drug use, hepatitis C. He was admitted to [facility] on 02/09/21 with a large left-sided pleural effusion.  Reported symptoms started on 02/06/21 with left-sided lateral chest wall pain with shortness of breath.  He had been in the emergency room the day before admission but left AMA.  He returned on May 26-22 with worsening pain and shortness of breath and CT scan at that time revealed a larger pleural effusion with mild cardiomegaly.  That time he been having some chills but no fever and had no other symptoms.  He did have a mild cough that was nonproductive.  He reported the pain was a constant dull pain with intermittent sharp episodes of pain.  He had no history of cardiac disease and no history of DVTs or PE.  CT angiography chest was performed with no PE identified but patient had enlarging pleural effusion.  He was admitted to [facility] and underwent thoracentesis with interventional radiology.  Fluid studies at that time showed a nucleated cell count of 993 with 75% neutrophils and LDH of 3000.  Cultures initially did not show any infection.  Final culture results were not available at this time.  He was initially placed on vancomycin and Zosyn for antibiotic coverage.  Antibiotic was changed to clindamycin secondary to worsening renal function.  Renal function has been slowly improving since antibiotic regimen was changed.  On February 16, 2021 patient was continued as shortness of breath and requiring oxygen and repeat chest x-ray revealed an enlarging pleural effusion on the left again.  Patient was being followed by pulmonology at [facility] and they recommended patient be evaluated by cardiothoracic surgery for decortication. Pt was  transferred to [facility] for CT surgical eval  Assessment & Plan:   Principal Problem:   Pleural effusion on left Active Problems:   Schizoaffective disorder, bipolar type (HCC)   Polysubstance dependence including opioid type drug with complication, continuous use (HCC)   AKI (acute kidney injury) (HCC)   Essential hypertension   Pleural effusion    Pleural effusion on left -Pt is s/p thoracentesis twice by IR at [facility] with reaccumulation of left-sided pleural effusion.   -CTS was consulted. Recommendation for IR consultation for IR guided drainage, pending - Pt is now followed by IR and is s/p CT guided L thoracostomy tube placement 6/4 with fluid sent for culture  -Repeat CXR today reviewed, findings of decrease in apparent pleural fluid after thoracostomy tube placement -Cultures reviewed, thus far no organisms seen  Active Problems:   Essential hypertension -BP remains stable at present -Cont on Avapro as tolerated    AKI (acute kidney injury)  -Presenting Creatinine is 1.60 with BUN of 16 on labs from February 16, 2021.  Notes from physician at [facility] states that AKI has been improving. -Fluids currently on hold -Will recheck bmet in AM, if no further improvement in Cr, consider resuming IVF    Schizoaffective disorder, bipolar type -Patient with history of schizoaffective disorder.   -Pt is continued on Seroquel since being at [facility]     Polysubstance dependence including opioid type drug with complication, continuous use -will need cessation     Tobacco use Nicotine patch provided to prevent nicotine withdrawls.    DVT prophylaxis: Lovenox subq  Code Status: Full Family Communication: Pt in room, family at bedside  Status is: Inpatient  Remains inpatient appropriate because:Inpatient level of care appropriate due to severity of illness  Dispo: The patient is from: Home              Anticipated d/c is to: Home              Patient  currently is not medically stable to d/c.   Difficult to place patient No  Consultants:   CTS  IR  Procedures:   Thoracostomy tube placement by IR 6/4  Antimicrobials: Anti-infectives (From admission, onward)   Start     Dose/Rate Route Frequency Ordered Stop   02/16/21 2200  clindamycin (CLEOCIN) IVPB 600 mg        600 mg 100 mL/hr over 30 Minutes Intravenous Every 8 hours 02/16/21 2057        Subjective: Complaining of chest pain post-chest tube placement  Objective: Vitals:   02/18/21 2353 02/19/21 0400 02/19/21 1235 02/19/21 1319  BP: 125/63 138/86 119/86 120/82  Pulse:  98 97 99  Resp: 20 13 19 17   Temp: 99.3 F (37.4 C) 97.8 F (36.6 C)  97.6 F (36.4 C)  TempSrc: Oral Oral  Oral  SpO2: 93% 93% 94% 92%    Intake/Output Summary (Last 24 hours) at 02/19/2021 1541 Last data filed at 02/19/2021 1327 Gross per 24 hour  Intake 30 ml  Output 2920 ml  Net -2890 ml   There were no vitals filed for this visit.  Examination: General exam: Conversant, in no acute distress Respiratory system: normal chest rise, clear, no audible wheezing, chest tube in place Cardiovascular system: regular rhythm, s1-s2 Gastrointestinal system: Nondistended, nontender, pos BS Central nervous system: No seizures, no tremors Extremities: No cyanosis, no joint deformities Skin: No rashes, no pallor Psychiatry: Affect normal // no auditory hallucinations   Data Reviewed: I have personally reviewed following labs and imaging studies  CBC: Recent Labs  Lab 02/16/21 2135 02/17/21 0145 02/18/21 0233 02/19/21 0212  WBC 8.3 7.5 9.0 8.4  HGB 9.7* 9.2* 10.3* 10.4*  HCT 29.9* 28.2* 31.1* 31.9*  MCV 89.8 89.2 88.6 88.9  PLT 431* 358 388 416*   Basic Metabolic Panel: Recent Labs  Lab 02/16/21 2135 02/17/21 0145 02/18/21 0233 02/19/21 0212  NA  --  138 136 137  K  --  3.8 4.3 4.0  CL  --  98 94* 96*  CO2  --  30 31 30   GLUCOSE  --  101* 109* 143*  BUN  --  10 12 16    CREATININE 1.60* 1.57* 1.42* 1.41*  CALCIUM  --  8.7* 9.3 9.1   GFR: CrCl cannot be calculated (Unknown ideal weight.). Liver Function Tests: Recent Labs  Lab 02/18/21 0233 02/19/21 0212  AST 24 27  ALT 22 27  ALKPHOS 90 91  BILITOT <0.1* 0.4  PROT 7.1 6.8  ALBUMIN 2.5* 2.5*   No results for input(s): LIPASE, AMYLASE in the last 168 hours. No results for input(s): AMMONIA in the last 168 hours. Coagulation Profile: No results for input(s): INR, PROTIME in the last 168 hours. Cardiac Enzymes: No results for input(s): CKTOTAL, CKMB, CKMBINDEX, TROPONINI in the last 168 hours. BNP (last 3 results) No results for input(s): PROBNP in the last 8760 hours. HbA1C: No results for input(s): HGBA1C in the last 72 hours. CBG: No results for input(s): GLUCAP in the last 168 hours. Lipid Profile: No results for input(s): CHOL, HDL, LDLCALC,  TRIG, CHOLHDL, LDLDIRECT in the last 72 hours. Thyroid Function Tests: No results for input(s): TSH, T4TOTAL, FREET4, T3FREE, THYROIDAB in the last 72 hours. Anemia Panel: No results for input(s): VITAMINB12, FOLATE, FERRITIN, TIBC, IRON, RETICCTPCT in the last 72 hours. Sepsis Labs: No results for input(s): PROCALCITON, LATICACIDVEN in the last 168 hours.  Recent Results (from the past 240 hour(s))  SARS CORONAVIRUS 2 (TAT 6-24 HRS) Nasopharyngeal Nasopharyngeal Swab     Status: None   Collection Time: 02/17/21 12:27 AM   Specimen: Nasopharyngeal Swab  Result Value Ref Range Status   SARS Coronavirus 2 NEGATIVE NEGATIVE Final    Comment: (NOTE) SARS-CoV-2 target nucleic acids are NOT DETECTED.  The SARS-CoV-2 RNA is generally detectable in upper and lower respiratory specimens during the acute phase of infection. Negative results do not preclude SARS-CoV-2 infection, do not rule out co-infections with other pathogens, and should not be used as the sole basis for treatment or other patient management decisions. Negative results must be  combined with clinical observations, patient history, and epidemiological information. The expected result is Negative.  Fact Sheet for Patients: HairSlick.no  Fact Sheet for Healthcare Providers: quierodirigir.com  This test is not yet approved or cleared by the [place] FDA and  has been authorized for detection and/or diagnosis of SARS-CoV-2 by FDA under an Emergency Use Authorization (EUA). This EUA will remain  in effect (meaning this test can be used) for the duration of the COVID-19 declaration under Se ction 564(b)(1) of the Act, 21 U.S.C. section 360bbb-3(b)(1), unless the authorization is terminated or revoked sooner.  Performed at [facility], [address] [place], [place] [address]   Aerobic/Anaerobic Culture w Gram Stain (surgical/deep wound)     Status: None (Preliminary result)   Collection Time: 02/18/21 12:22 PM   Specimen: Pleural Fluid  Result Value Ref Range Status   Specimen Description PLEURAL FLUID  Final   Special Requests LUNG LEFT  Final   Gram Stain   Final    MODERATE WBC PRESENT,BOTH PMN AND MONONUCLEAR NO ORGANISMS SEEN    Culture   Final    NO GROWTH < 24 HOURS Performed at [facility], [address] [place], [place] [address]    Report Status PENDING  Incomplete     Radiology Studies: DG Chest Port 1 View  Result Date: 02/19/2021 CLINICAL DATA:  Status post pigtail thoracostomy tube placement on the left for treatment of loculated left pleural effusion. EXAM: PORTABLE CHEST 1 VIEW COMPARISON:  02/16/2021 chest x-ray at [facility] FINDINGS: Stable heart size. Left mid chest posterior thoracostomy pigtail catheter present. Decrease in apparent pleural fluid since the prior chest x-ray with improved aeration of the left lung. Areas of residual atelectasis and or infiltrate remain in the left lower lung. Mild atelectasis at the right lung base. No pneumothorax.  IMPRESSION: Decrease in apparent pleural fluid after thoracostomy tube placement. Improved aeration of the left lung with residual atelectasis/infiltrate remaining. No pneumothorax. Electronically Signed   By: Irish Lack M.D.   On: 02/19/2021 12:31   CT IMAGE GUIDED DRAINAGE BY PERCUTANEOUS CATHETER  Result Date: 02/18/2021 CLINICAL DATA:  Loculated left pleural fluid collection/empyema requiring catheter drainage. EXAM: CT GUIDED LEFT THORACOSTOMY TUBE PLACEMENT ANESTHESIA/SEDATION: 5.0 mg IV Versed 200 mcg IV Fentanyl Total Moderate Sedation Time:  29 minutes The patient's level of consciousness and physiologic status were continuously monitored during the procedure by Radiology nursing. PROCEDURE: The procedure, risks, benefits, and alternatives were explained to  the patient. Questions regarding the procedure were encouraged and answered. The patient understands and consents to the procedure. A time out was performed prior to initiating the procedure. CT was performed in a supine oblique position through the chest followed by a decubitus position with the left side up. The left posterior chest wall was prepped with chlorhexidine in a sterile fashion, and a sterile drape was applied covering the operative field. A sterile gown and sterile gloves were used for the procedure. Local anesthesia was provided with 1% Lidocaine. Under CT guidance, an 18 gauge trocar needle was advanced into the left posterior pleural space. After confirming needle tip position, a guidewire was advanced. The tract was dilated and a 12 French percutaneous drainage catheter advanced. A fluid sample was withdrawn and sent for culture analysis. CT was performed to confirm catheter position. The catheter was connected to a Sahara Pleur-evac device and secured at the skin with a Prolene retention suture and StatLock device. COMPLICATIONS: None FINDINGS: Residual posterior loculated component left pleural fluid was targeted for catheter  placement. There was return of clear, yellow fluid. The Pleur-evac device will be connected to wall suction at -20 cm of water. IMPRESSION: Left thoracostomy tube placement for within residual loculated posterior left pleural effusion/empyema. A 12 French drainage catheter was placed and attached to a Pleur-evac device which will be connected to wall suction. A sample of fluid was sent for culture analysis. Electronically Signed   By: Irish LackGlenn  Yamagata M.D.   On: 02/18/2021 13:16    Scheduled Meds: . enoxaparin (LOVENOX) injection  40 mg Subcutaneous Daily  . FLUoxetine  20 mg Oral Daily  . irbesartan  150 mg Oral Daily  . nicotine  21 mg Transdermal Daily  . QUEtiapine  50 mg Oral QHS  . sodium chloride flush  3 mL Intravenous Q12H   Continuous Infusions: . sodium chloride    . clindamycin (CLEOCIN) IV 600 mg (02/19/21 1351)     LOS: 3 days   Rickey BarbaraStephen Taro Hidrogo, MD [facility] Pager On Amion  If 7PM-7AM, please contact night-coverage 02/19/2021, 3:41 PM

## 2021-02-20 LAB — COMPREHENSIVE METABOLIC PANEL
ALT: 30 U/L (ref 0–44)
AST: 23 U/L (ref 15–41)
Albumin: 2.6 g/dL — ABNORMAL LOW (ref 3.5–5.0)
Alkaline Phosphatase: 90 U/L (ref 38–126)
Anion gap: 11 (ref 5–15)
BUN: 17 mg/dL (ref 6–20)
CO2: 30 mmol/L (ref 22–32)
Calcium: 9.2 mg/dL (ref 8.9–10.3)
Chloride: 96 mmol/L — ABNORMAL LOW (ref 98–111)
Creatinine, Ser: 1.58 mg/dL — ABNORMAL HIGH (ref 0.61–1.24)
GFR, Estimated: 57 mL/min — ABNORMAL LOW (ref >60)
Glucose, Bld: 126 mg/dL — ABNORMAL HIGH (ref 70–99)
Potassium: 4.1 mmol/L (ref 3.5–5.1)
Sodium: 137 mmol/L (ref 135–145)
Total Bilirubin: 0.3 mg/dL (ref 0.3–1.2)
Total Protein: 6.9 g/dL (ref 6.5–8.1)

## 2021-02-20 LAB — CBC
HCT: 34 % — ABNORMAL LOW (ref 39.0–52.0)
Hemoglobin: 11 g/dL — ABNORMAL LOW (ref 13.0–17.0)
MCH: 29.1 pg (ref 26.0–34.0)
MCHC: 32.4 g/dL (ref 30.0–36.0)
MCV: 89.9 fL (ref 80.0–100.0)
Platelets: 416 10*3/uL — ABNORMAL HIGH (ref 150–400)
RBC: 3.78 MIL/uL — ABNORMAL LOW (ref 4.22–5.81)
RDW: 13.2 % (ref 11.5–15.5)
WBC: 9.9 10*3/uL (ref 4.0–10.5)
nRBC: 0 % (ref 0.0–0.2)

## 2021-02-20 MED ORDER — ZOLPIDEM TARTRATE 5 MG PO TABS
5.0000 mg | ORAL_TABLET | Freq: Every evening | ORAL | Status: DC | PRN
  Administered 2021-02-20 – 2021-02-21 (×2): 5 mg via ORAL
  Filled 2021-02-20 (×2): qty 1

## 2021-02-20 MED ORDER — FLUOXETINE HCL 20 MG PO CAPS
40.0000 mg | ORAL_CAPSULE | Freq: Every day | ORAL | Status: DC
  Administered 2021-02-21 – 2021-02-22 (×2): 40 mg via ORAL
  Filled 2021-02-20 (×2): qty 2

## 2021-02-20 MED ORDER — SODIUM CHLORIDE 0.9 % IV SOLN
INTRAVENOUS | Status: DC
  Administered 2021-02-21: 1000 mL via INTRAVENOUS

## 2021-02-20 MED ORDER — QUETIAPINE FUMARATE 50 MG PO TABS
50.0000 mg | ORAL_TABLET | Freq: Every day | ORAL | Status: DC
  Administered 2021-02-21 – 2021-02-22 (×2): 50 mg via ORAL
  Filled 2021-02-20 (×2): qty 1

## 2021-02-20 MED ORDER — QUETIAPINE FUMARATE 100 MG PO TABS
300.0000 mg | ORAL_TABLET | Freq: Every day | ORAL | Status: DC
  Administered 2021-02-20 – 2021-02-21 (×2): 300 mg via ORAL
  Filled 2021-02-20 (×2): qty 3

## 2021-02-20 NOTE — Progress Notes (Signed)
PROGRESS NOTE    John [name]  ACZ:[id number] DOB: 1981/05/02 DOA: 02/16/2021 PCP: Patient, No Pcp Per (Inactive)    Brief Narrative:  40 y.o. male with medical history significant for substance abuse, affective disorder, previous stroke secondary to drug use, hepatitis C. He was admitted to [facility] on 02/09/21 with a large left-sided pleural effusion.  Reported symptoms started on 02/06/21 with left-sided lateral chest wall pain with shortness of breath.  He had been in the emergency room the day before admission but left AMA.  He returned on May 26-22 with worsening pain and shortness of breath and CT scan at that time revealed a larger pleural effusion with mild cardiomegaly.  That time he been having some chills but no fever and had no other symptoms.  He did have a mild cough that was nonproductive.  He reported the pain was a constant dull pain with intermittent sharp episodes of pain.  He had no history of cardiac disease and no history of DVTs or PE.  CT angiography chest was performed with no PE identified but patient had enlarging pleural effusion.  He was admitted to [facility] and underwent thoracentesis with interventional radiology.  Fluid studies at that time showed a nucleated cell count of 993 with 75% neutrophils and LDH of 3000.  Cultures initially did not show any infection.  Final culture results were not available at this time.  He was initially placed on vancomycin and Zosyn for antibiotic coverage.  Antibiotic was changed to clindamycin secondary to worsening renal function.  Renal function has been slowly improving since antibiotic regimen was changed.  On February 16, 2021 patient was continued as shortness of breath and requiring oxygen and repeat chest x-ray revealed an enlarging pleural effusion on the left again.  Patient was being followed by pulmonology at [facility] and they recommended patient be evaluated by cardiothoracic surgery for decortication. Pt was  transferred to [facility] for CT surgical eval  Assessment & Plan:   Principal Problem:   Pleural effusion on left Active Problems:   Schizoaffective disorder, bipolar type (HCC)   Polysubstance dependence including opioid type drug with complication, continuous use (HCC)   AKI (acute kidney injury) (HCC)   Essential hypertension   Pleural effusion    Pleural effusion on left -Pt is s/p thoracentesis twice by IR at [facility] with reaccumulation of left-sided pleural effusion.   -CTS was consulted. Recommendation for IR consultation for IR guided drainage, pending - Pt is now followed by IR and is s/p CT guided L thoracostomy tube placement 6/4 with fluid sent for culture  -Repeat CXR today reviewed, findings of decrease in apparent pleural fluid after thoracostomy tube placement -Cultures without organisms seen. Fluid clear and thin in tube -F/u on IR recs  Active Problems:   Essential hypertension -BP remains stable at present -Cont on Avapro as tolerated    AKI (acute kidney injury)  -Presenting Creatinine is 1.60 with BUN of 16 on labs from February 16, 2021.  Notes from physician at [facility] states that AKI has been improving. -Fluids currently on hold -Cr is up to 1.58. Continue IVF hydration -Repeat bmet in AM    Schizoaffective disorder, bipolar type -Patient with history of schizoaffective disorder.   -Appreciate assistance by pharmacy. Most recent confirmed med rec from [facility] was from 2021: Seroquel 50mg  at noon and 300mg /hs, Prozac 40mg /d, Straterra 40mg /d, Abilify 2.5mg /day     Polysubstance dependence including opioid type drug with complication, continuous use -will need  cessation     Tobacco use Nicotine patch provided to prevent nicotine withdrawls.    DVT prophylaxis: Lovenox subq Code Status: Full Family Communication: Pt in room, family at bedside  Status is: Inpatient  Remains inpatient appropriate because:Inpatient level of care  appropriate due to severity of illness  Dispo: The patient is from: Home              Anticipated d/c is to: Home              Patient currently is not medically stable to d/c.   Difficult to place patient No  Consultants:   CTS  IR  Procedures:   Thoracostomy tube placement by IR 6/4  Antimicrobials: Anti-infectives (From admission, onward)   Start     Dose/Rate Route Frequency Ordered Stop   02/16/21 2200  clindamycin (CLEOCIN) IVPB 600 mg        600 mg 100 mL/hr over 30 Minutes Intravenous Every 8 hours 02/16/21 2057        Subjective: Asking about going home soon  Objective: Vitals:   02/19/21 1800 02/19/21 1937 02/20/21 0503 02/20/21 1225  BP:  (!) 151/93 (!) 118/55 124/69  Pulse: 98 89  90  Resp: 17 20 20 18   Temp:  98.6 F (37 C) 98.6 F (37 C) 98.2 F (36.8 C)  TempSrc:  Oral Oral Oral  SpO2: 92% 90% 92% 92%    Intake/Output Summary (Last 24 hours) at 02/20/2021 1657 Last data filed at 02/20/2021 1618 Gross per 24 hour  Intake 670 ml  Output 943 ml  Net -273 ml   There were no vitals filed for this visit.  Examination: General exam: Awake, laying in bed, in nad Respiratory system: Normal respiratory effort, no wheezing, chest tube in place Cardiovascular system: regular rate, s1, s2 Gastrointestinal system: Soft, nondistended, positive BS Central nervous system: CN2-12 grossly intact, strength intact Extremities: Perfused, no clubbing Skin: Normal skin turgor, no notable skin lesions seen Psychiatry: Mood normal // no visual hallucinations   Data Reviewed: I have personally reviewed following labs and imaging studies  CBC: Recent Labs  Lab 02/16/21 2135 02/17/21 0145 02/18/21 0233 02/19/21 0212 02/20/21 0440  WBC 8.3 7.5 9.0 8.4 9.9  HGB 9.7* 9.2* 10.3* 10.4* 11.0*  HCT 29.9* 28.2* 31.1* 31.9* 34.0*  MCV 89.8 89.2 88.6 88.9 89.9  PLT 431* 358 388 416* 416*   Basic Metabolic Panel: Recent Labs  Lab 02/16/21 2135 02/17/21 0145  02/18/21 0233 02/19/21 0212 02/20/21 0440  NA  --  138 136 137 137  K  --  3.8 4.3 4.0 4.1  CL  --  98 94* 96* 96*  CO2  --  30 31 30 30   GLUCOSE  --  101* 109* 143* 126*  BUN  --  10 12 16 17   CREATININE 1.60* 1.57* 1.42* 1.41* 1.58*  CALCIUM  --  8.7* 9.3 9.1 9.2   GFR: CrCl cannot be calculated (Unknown ideal weight.). Liver Function Tests: Recent Labs  Lab 02/18/21 0233 02/19/21 0212 02/20/21 0440  AST 24 27 23   ALT 22 27 30   ALKPHOS 90 91 90  BILITOT <0.1* 0.4 0.3  PROT 7.1 6.8 6.9  ALBUMIN 2.5* 2.5* 2.6*   No results for input(s): LIPASE, AMYLASE in the last 168 hours. No results for input(s): AMMONIA in the last 168 hours. Coagulation Profile: No results for input(s): INR, PROTIME in the last 168 hours. Cardiac Enzymes: No results for input(s): CKTOTAL, CKMB, CKMBINDEX, TROPONINI in  the last 168 hours. BNP (last 3 results) No results for input(s): PROBNP in the last 8760 hours. HbA1C: No results for input(s): HGBA1C in the last 72 hours. CBG: No results for input(s): GLUCAP in the last 168 hours. Lipid Profile: No results for input(s): CHOL, HDL, LDLCALC, TRIG, CHOLHDL, LDLDIRECT in the last 72 hours. Thyroid Function Tests: No results for input(s): TSH, T4TOTAL, FREET4, T3FREE, THYROIDAB in the last 72 hours. Anemia Panel: No results for input(s): VITAMINB12, FOLATE, FERRITIN, TIBC, IRON, RETICCTPCT in the last 72 hours. Sepsis Labs: No results for input(s): PROCALCITON, LATICACIDVEN in the last 168 hours.  Recent Results (from the past 240 hour(s))  SARS CORONAVIRUS 2 (TAT 6-24 HRS) Nasopharyngeal Nasopharyngeal Swab     Status: None   Collection Time: 02/17/21 12:27 AM   Specimen: Nasopharyngeal Swab  Result Value Ref Range Status   SARS Coronavirus 2 NEGATIVE NEGATIVE Final    Comment: (NOTE) SARS-CoV-2 target nucleic acids are NOT DETECTED.  The SARS-CoV-2 RNA is generally detectable in upper and lower respiratory specimens during the acute phase  of infection. Negative results do not preclude SARS-CoV-2 infection, do not rule out co-infections with other pathogens, and should not be used as the sole basis for treatment or other patient management decisions. Negative results must be combined with clinical observations, patient history, and epidemiological information. The expected result is Negative.  Fact Sheet for Patients: HairSlick.no  Fact Sheet for Healthcare Providers: quierodirigir.com  This test is not yet approved or cleared by the [place] FDA and  has been authorized for detection and/or diagnosis of SARS-CoV-2 by FDA under an Emergency Use Authorization (EUA). This EUA will remain  in effect (meaning this test can be used) for the duration of the COVID-19 declaration under Se ction 564(b)(1) of the Act, 21 U.S.C. section 360bbb-3(b)(1), unless the authorization is terminated or revoked sooner.  Performed at [facility], [address], [place], [place] [address]   Aerobic/Anaerobic Culture w Gram Stain (surgical/deep wound)     Status: None (Preliminary result)   Collection Time: 02/18/21 12:22 PM   Specimen: Pleural Fluid  Result Value Ref Range Status   Specimen Description PLEURAL FLUID  Final   Special Requests LUNG LEFT  Final   Gram Stain   Final    MODERATE WBC PRESENT,BOTH PMN AND MONONUCLEAR NO ORGANISMS SEEN    Culture   Final    NO GROWTH 2 DAYS NO ANAEROBES ISOLATED; CULTURE IN PROGRESS FOR 5 DAYS Performed at [facility], [address], [place], [place] [address]    Report Status PENDING  Incomplete     Radiology Studies: DG Chest Port 1 View  Result Date: 02/19/2021 CLINICAL DATA:  Status post pigtail thoracostomy tube placement on the left for treatment of loculated left pleural effusion. EXAM: PORTABLE CHEST 1 VIEW COMPARISON:  02/16/2021 chest x-ray at [facility] FINDINGS: Stable heart size. Left mid chest  posterior thoracostomy pigtail catheter present. Decrease in apparent pleural fluid since the prior chest x-ray with improved aeration of the left lung. Areas of residual atelectasis and or infiltrate remain in the left lower lung. Mild atelectasis at the right lung base. No pneumothorax. IMPRESSION: Decrease in apparent pleural fluid after thoracostomy tube placement. Improved aeration of the left lung with residual atelectasis/infiltrate remaining. No pneumothorax. Electronically Signed   By: Irish Lack M.D.   On: 02/19/2021 12:31    Scheduled Meds: . enoxaparin (LOVENOX) injection  40 mg Subcutaneous Daily  . FLUoxetine  20 mg Oral Daily  . irbesartan  150 mg Oral Daily  . nicotine  21 mg Transdermal Daily  . QUEtiapine  50 mg Oral QHS  . sodium chloride flush  3 mL Intravenous Q12H   Continuous Infusions: . sodium chloride    . sodium chloride 75 mL/hr at 02/20/21 0931  . clindamycin (CLEOCIN) IV 600 mg (02/20/21 1350)     LOS: 4 days   Rickey BarbaraStephen Aneesah Hernan, MD [facility] Pager On Amion  If 7PM-7AM, please contact night-coverage 02/20/2021, 4:57 PM

## 2021-02-20 NOTE — Plan of Care (Signed)
  Problem: Education: Goal: Knowledge of General Education information will improve Description: Including pain rating scale, medication(s)/side effects and non-pharmacologic comfort measures Outcome: Progressing   Problem: Clinical Measurements: Goal: Respiratory complications will improve Outcome: Progressing   Problem: Nutrition: Goal: Adequate nutrition will be maintained Outcome: Progressing   

## 2021-02-20 NOTE — Plan of Care (Signed)
  Problem: Elimination: Goal: Will not experience complications related to urinary retention Outcome: Progressing   Problem: Pain Managment: Goal: General experience of comfort will improve Outcome: Progressing   

## 2021-02-20 NOTE — Progress Notes (Signed)
No lice or nits noted in pt. Hair

## 2021-02-20 NOTE — Plan of Care (Signed)

## 2021-02-21 ENCOUNTER — Inpatient Hospital Stay (HOSPITAL_COMMUNITY): Payer: Self-pay

## 2021-02-21 LAB — CBC
HCT: 33.2 % — ABNORMAL LOW (ref 39.0–52.0)
Hemoglobin: 10.8 g/dL — ABNORMAL LOW (ref 13.0–17.0)
MCH: 29.1 pg (ref 26.0–34.0)
MCHC: 32.5 g/dL (ref 30.0–36.0)
MCV: 89.5 fL (ref 80.0–100.0)
Platelets: 412 10*3/uL — ABNORMAL HIGH (ref 150–400)
RBC: 3.71 MIL/uL — ABNORMAL LOW (ref 4.22–5.81)
RDW: 13.3 % (ref 11.5–15.5)
WBC: 9.3 10*3/uL (ref 4.0–10.5)
nRBC: 0 % (ref 0.0–0.2)

## 2021-02-21 LAB — COMPREHENSIVE METABOLIC PANEL
ALT: 27 U/L (ref 0–44)
AST: 17 U/L (ref 15–41)
Albumin: 2.8 g/dL — ABNORMAL LOW (ref 3.5–5.0)
Alkaline Phosphatase: 83 U/L (ref 38–126)
Anion gap: 12 (ref 5–15)
BUN: 22 mg/dL — ABNORMAL HIGH (ref 6–20)
CO2: 27 mmol/L (ref 22–32)
Calcium: 9.1 mg/dL (ref 8.9–10.3)
Chloride: 97 mmol/L — ABNORMAL LOW (ref 98–111)
Creatinine, Ser: 1.63 mg/dL — ABNORMAL HIGH (ref 0.61–1.24)
GFR, Estimated: 55 mL/min — ABNORMAL LOW (ref >60)
Glucose, Bld: 105 mg/dL — ABNORMAL HIGH (ref 70–99)
Potassium: 4.4 mmol/L (ref 3.5–5.1)
Sodium: 136 mmol/L (ref 135–145)
Total Bilirubin: 0.2 mg/dL — ABNORMAL LOW (ref 0.3–1.2)
Total Protein: 7 g/dL (ref 6.5–8.1)

## 2021-02-21 MED ORDER — LORAZEPAM 2 MG/ML IJ SOLN
0.5000 mg | Freq: Once | INTRAMUSCULAR | Status: AC
  Administered 2021-02-21: 0.5 mg via INTRAVENOUS
  Filled 2021-02-21: qty 1

## 2021-02-21 NOTE — Progress Notes (Addendum)
Referring Physician(s): Chiu,S  Supervising Physician: Oley Balm  Patient Status:  [facility] - In-pt  Chief Complaint:  Loculated left pleural effusion  Subjective: Pt doing ok today; asking about going home; has some soreness at chest drain site as expected; denies worsening dyspnea/cough   Allergies: Haldol [haloperidol lactate], Haloperidol, Tylenol [acetaminophen], and Risperidone  Medications: Prior to Admission medications   Medication Sig Start Date End Date Taking? Authorizing Provider  ARIPiprazole (ABILIFY) 10 MG tablet Take 1 tablet (10 mg total) by mouth at bedtime. 07/27/19  Yes Aldean Baker, NP  FLUoxetine (PROZAC) 20 MG capsule Take 1 capsule (20 mg total) by mouth daily. 07/27/19  Yes Aldean Baker, NP  nicotine (NICODERM CQ - DOSED IN MG/24 HOURS) 21 mg/24hr patch Place 1 patch (21 mg total) onto the skin daily. 07/28/19  Yes Aldean Baker, NP  QUEtiapine (SEROQUEL) 400 MG tablet Take 400 mg by mouth at bedtime.   Yes [provider]  traZODone (DESYREL) 150 MG tablet Take 1 tablet (150 mg total) by mouth at bedtime as needed for sleep. 07/27/19  Yes Aldean Baker, NP  gabapentin (NEURONTIN) 300 MG capsule Take 1 capsule (300 mg total) by mouth 3 (three) times daily. Patient not taking: No sig reported 07/27/19   Aldean Baker, NP  hydrOXYzine (ATARAX/VISTARIL) 25 MG tablet Take 1 tablet (25 mg total) by mouth 3 (three) times daily as needed for anxiety. Patient not taking: No sig reported 07/27/19   Aldean Baker, NP  loratadine (CLARITIN) 10 MG tablet Take 1 tablet (10 mg total) by mouth daily. Patient not taking: No sig reported 07/28/19   Aldean Baker, NP  neomycin-bacitracin-polymyxin (NEOSPORIN) ointment Apply topically 2 (two) times daily. Patient not taking: No sig reported 07/27/19   Aldean Baker, NP  pantoprazole (PROTONIX) 40 MG tablet Take 1 tablet (40 mg total) by mouth daily. Patient not taking: No sig reported 07/28/19   Aldean Baker, NP  sodium chloride (OCEAN) 0.65 % SOLN nasal spray Place 1 spray into both nostrils as needed for congestion. Patient not taking: No sig reported 07/27/19   Aldean Baker, NP     Vital Signs: BP 130/78   Pulse 92   Temp 98.5 F (36.9 C) (Oral)   Resp 15   SpO2 95%   Physical Exam awake/alert; left post chest drain intact, insertion site ok, mild-mod tender, no obvious air leak on pleuravac; OP 20 cc yellow fluid  Imaging: DG Chest Port 1 View  Result Date: 02/21/2021 CLINICAL DATA:  LEFT side pain, chest tube EXAM: PORTABLE CHEST 1 VIEW COMPARISON:  Portable exam 1230 hours compared to 02/19/2021 FINDINGS: Pigtail LEFT thoracostomy tube again identified. Enlargement of cardiac silhouette. Mediastinal contours and pulmonary vascularity normal. Bibasilar atelectasis. No acute infiltrate, pleural effusion, or pneumothorax. IMPRESSION: LEFT thoracostomy tube without pneumothorax. Enlargement of cardiac silhouette with bibasilar atelectasis. Electronically Signed   By: Ulyses Southward M.D.   On: 02/21/2021 14:32   DG Chest Port 1 View  Result Date: 02/19/2021 CLINICAL DATA:  Status post pigtail thoracostomy tube placement on the left for treatment of loculated left pleural effusion. EXAM: PORTABLE CHEST 1 VIEW COMPARISON:  02/16/2021 chest x-ray at [facility] FINDINGS: Stable heart size. Left mid chest posterior thoracostomy pigtail catheter present. Decrease in apparent pleural fluid since the prior chest x-ray with improved aeration of the left lung. Areas of residual atelectasis and or infiltrate remain in the left lower lung. Mild atelectasis  at the right lung base. No pneumothorax. IMPRESSION: Decrease in apparent pleural fluid after thoracostomy tube placement. Improved aeration of the left lung with residual atelectasis/infiltrate remaining. No pneumothorax. Electronically Signed   By: Irish Lack M.D.   On: 02/19/2021 12:31   CT IMAGE GUIDED DRAINAGE BY PERCUTANEOUS  CATHETER  Result Date: 02/18/2021 CLINICAL DATA:  Loculated left pleural fluid collection/empyema requiring catheter drainage. EXAM: CT GUIDED LEFT THORACOSTOMY TUBE PLACEMENT ANESTHESIA/SEDATION: 5.0 mg IV Versed 200 mcg IV Fentanyl Total Moderate Sedation Time:  29 minutes The patient's level of consciousness and physiologic status were continuously monitored during the procedure by Radiology nursing. PROCEDURE: The procedure, risks, benefits, and alternatives were explained to the patient. Questions regarding the procedure were encouraged and answered. The patient understands and consents to the procedure. A time out was performed prior to initiating the procedure. CT was performed in a supine oblique position through the chest followed by a decubitus position with the left side up. The left posterior chest wall was prepped with chlorhexidine in a sterile fashion, and a sterile drape was applied covering the operative field. A sterile gown and sterile gloves were used for the procedure. Local anesthesia was provided with 1% Lidocaine. Under CT guidance, an 18 gauge trocar needle was advanced into the left posterior pleural space. After confirming needle tip position, a guidewire was advanced. The tract was dilated and a 12 French percutaneous drainage catheter advanced. A fluid sample was withdrawn and sent for culture analysis. CT was performed to confirm catheter position. The catheter was connected to a [employer]ara Pleur-evac device and secured at the skin with a Prolene retention suture and StatLock device. COMPLICATIONS: None FINDINGS: Residual posterior loculated component left pleural fluid was targeted for catheter placement. There was return of clear, yellow fluid. The Pleur-evac device will be connected to wall suction at -20 cm of water. IMPRESSION: Left thoracostomy tube placement for within residual loculated posterior left pleural effusion/empyema. A 12 French drainage catheter was placed and attached  to a Pleur-evac device which will be connected to wall suction. A sample of fluid was sent for culture analysis. Electronically Signed   By: Irish Lack M.D.   On: 02/18/2021 13:16    Labs:  CBC: Recent Labs    02/18/21 0233 02/19/21 0212 02/20/21 0440 02/21/21 0419  WBC 9.0 8.4 9.9 9.3  HGB 10.3* 10.4* 11.0* 10.8*  HCT 31.1* 31.9* 34.0* 33.2*  PLT 388 416* 416* 412*    COAGS: No results for input(s): INR, APTT in the last 8760 hours.  BMP: Recent Labs    02/18/21 0233 02/19/21 0212 02/20/21 0440 02/21/21 0419  NA 136 137 137 136  K 4.3 4.0 4.1 4.4  CL 94* 96* 96* 97*  CO2 31 30 30 27   GLUCOSE 109* 143* 126* 105*  BUN 12 16 17  22*  CALCIUM 9.3 9.1 9.2 9.1  CREATININE 1.42* 1.41* 1.58* 1.63*  GFRNONAA >60 >60 57* 55*    LIVER FUNCTION TESTS: Recent Labs    02/18/21 0233 02/19/21 0212 02/20/21 0440 02/21/21 0419  BILITOT <0.1* 0.4 0.3 0.2*  AST 24 27 23 17   ALT 22 27 30 27   ALKPHOS 90 91 90 83  PROT 7.1 6.8 6.9 7.0  ALBUMIN 2.5* 2.5* 2.6* 2.8*    Assessment and Plan: Pt with hx polysubstance abuse, loculated left pleural effusion, AKI; s/p left chest drain placement 6/4; afebrile; WBC nl ;hgb 10.8, creat 1.63(1.58); pleural fluid cx neg to date; CXR today- no ptx , no  sig effusion/infiltrate; would get additional input from TCTS before removing tube; also consider f/u CT chest without contrast(due to AKI) before removing tube in view of hx prev loculated effusion  Electronically Signed: D. Jeananne Rama, PA-C 02/21/2021, 3:04 PM   I spent a total of 15 minutes at the the patient's bedside AND on the patient's hospital floor or unit, greater than 50% of which was counseling/coordinating care for left chest drain    Patient ID: John [name], male   DOB: [birth year], 40 y.o.   MRN: [id number]

## 2021-02-21 NOTE — Progress Notes (Signed)
PROGRESS NOTE    John [name]  QPR:[id number] DOB: 06-05-1981 DOA: 02/16/2021 PCP: Patient, No Pcp Per (Inactive)    Brief Narrative:  40 y.o. male with medical history significant for substance abuse, affective disorder, previous stroke secondary to drug use, hepatitis C. He was admitted to [facility] on 02/09/21 with a large left-sided pleural effusion.  Reported symptoms started on 02/06/21 with left-sided lateral chest wall pain with shortness of breath.  He had been in the emergency room the day before admission but left AMA.  He returned on May 26-22 with worsening pain and shortness of breath and CT scan at that time revealed a larger pleural effusion with mild cardiomegaly.  That time he been having some chills but no fever and had no other symptoms.  He did have a mild cough that was nonproductive.  He reported the pain was a constant dull pain with intermittent sharp episodes of pain.  He had no history of cardiac disease and no history of DVTs or PE.  CT angiography chest was performed with no PE identified but patient had enlarging pleural effusion.  He was admitted to [facility] and underwent thoracentesis with interventional radiology.  Fluid studies at that time showed a nucleated cell count of 993 with 75% neutrophils and LDH of 3000.  Cultures initially did not show any infection.  Final culture results were not available at this time.  He was initially placed on vancomycin and Zosyn for antibiotic coverage.  Antibiotic was changed to clindamycin secondary to worsening renal function.  Renal function has been slowly improving since antibiotic regimen was changed.  On February 16, 2021 patient was continued as shortness of breath and requiring oxygen and repeat chest x-ray revealed an enlarging pleural effusion on the left again.  Patient was being followed by pulmonology at [facility] and they recommended patient be evaluated by cardiothoracic surgery for decortication. Pt was  transferred to [facility] for CT surgical eval  Assessment & Plan:   Principal Problem:   Pleural effusion on left Active Problems:   Schizoaffective disorder, bipolar type (HCC)   Polysubstance dependence including opioid type drug with complication, continuous use (HCC)   AKI (acute kidney injury) (HCC)   Essential hypertension   Pleural effusion    Pleural effusion on left -Pt is s/p thoracentesis twice by IR at [facility] with reaccumulation of left-sided pleural effusion.   -CTS was consulted. Recommendation for IR consultation for IR guided drainage, pending - Pt is now followed by IR and is s/p CT guided L thoracostomy tube placement 6/4 -Cultures without organisms seen. Fluid clear and thin in tube -Now with minimal tube output. F/u CXR with minimal effusion seen -Discussed with IR and CTS. Plan for CT chest without contrast. If findings are much improved, then possible removal of drain tube soon per IR   Active Problems:   Essential hypertension -BP remains stable currently -Cont on Avapro as tolerated    AKI (acute kidney injury)  -Presenting Creatinine is 1.60 with BUN of 16 on labs from February 16, 2021.  Notes from physician at [facility] states that AKI has been improving. -Cr is up to 1.63. Continue IVF hydration -Recheck bmet in AM    Schizoaffective disorder, bipolar type -Patient with history of schizoaffective disorder.   -Appreciate assistance by pharmacy. Most recent confirmed med rec from [facility] was from 2021: Seroquel 50mg  at noon and 300mg /hs, Prozac 40mg /d, Straterra 40mg /d, Abilify 2.5mg /day     Polysubstance dependence including opioid type  drug with complication, continuous use -will need cessation     Tobacco use Nicotine patch provided to prevent nicotine withdrawls.    DVT prophylaxis: Lovenox subq Code Status: Full Family Communication: Pt in room, family currently not at bedside  Status is: Inpatient  Remains inpatient  appropriate because:Inpatient level of care appropriate due to severity of illness  Dispo: The patient is from: Home              Anticipated d/c is to: Home              Patient currently is not medically stable to d/c.   Difficult to place patient No  Consultants:   CTS  IR  Procedures:   Thoracostomy tube placement by IR 6/4  Antimicrobials: Anti-infectives (From admission, onward)   Start     Dose/Rate Route Frequency Ordered Stop   02/16/21 2200  clindamycin (CLEOCIN) IVPB 600 mg        600 mg 100 mL/hr over 30 Minutes Intravenous Every 8 hours 02/16/21 2057        Subjective: Eager to go home soon  Objective: Vitals:   02/21/21 0800 02/21/21 1000 02/21/21 1200 02/21/21 1300  BP:  130/78    Pulse: 98  94 92  Resp:   (!) 24 15  Temp:      TempSrc:      SpO2: 93%  94% 95%    Intake/Output Summary (Last 24 hours) at 02/21/2021 1643 Last data filed at 02/21/2021 1439 Gross per 24 hour  Intake 550 ml  Output 4245 ml  Net -3695 ml   There were no vitals filed for this visit.  Examination: General exam: Conversant, in no acute distress Respiratory system: normal chest rise, clear, no audible wheezing Cardiovascular system: regular rhythm, s1-s2 Gastrointestinal system: Nondistended, nontender, pos BS Central nervous system: No seizures, no tremors Extremities: No cyanosis, no joint deformities Skin: No rashes, no pallor Psychiatry: Affect normal // no auditory hallucinations   Data Reviewed: I have personally reviewed following labs and imaging studies  CBC: Recent Labs  Lab 02/17/21 0145 02/18/21 0233 02/19/21 0212 02/20/21 0440 02/21/21 0419  WBC 7.5 9.0 8.4 9.9 9.3  HGB 9.2* 10.3* 10.4* 11.0* 10.8*  HCT 28.2* 31.1* 31.9* 34.0* 33.2*  MCV 89.2 88.6 88.9 89.9 89.5  PLT 358 388 416* 416* 412*   Basic Metabolic Panel: Recent Labs  Lab 02/17/21 0145 02/18/21 0233 02/19/21 0212 02/20/21 0440 02/21/21 0419  NA 138 136 137 137 136  K 3.8 4.3  4.0 4.1 4.4  CL 98 94* 96* 96* 97*  CO2 30 31 30 30 27   GLUCOSE 101* 109* 143* 126* 105*  BUN 10 12 16 17  22*  CREATININE 1.57* 1.42* 1.41* 1.58* 1.63*  CALCIUM 8.7* 9.3 9.1 9.2 9.1   GFR: CrCl cannot be calculated (Unknown ideal weight.). Liver Function Tests: Recent Labs  Lab 02/18/21 0233 02/19/21 0212 02/20/21 0440 02/21/21 0419  AST 24 27 23 17   ALT 22 27 30 27   ALKPHOS 90 91 90 83  BILITOT <0.1* 0.4 0.3 0.2*  PROT 7.1 6.8 6.9 7.0  ALBUMIN 2.5* 2.5* 2.6* 2.8*   No results for input(s): LIPASE, AMYLASE in the last 168 hours. No results for input(s): AMMONIA in the last 168 hours. Coagulation Profile: No results for input(s): INR, PROTIME in the last 168 hours. Cardiac Enzymes: No results for input(s): CKTOTAL, CKMB, CKMBINDEX, TROPONINI in the last 168 hours. BNP (last 3 results) No results for input(s): PROBNP in  the last 8760 hours. HbA1C: No results for input(s): HGBA1C in the last 72 hours. CBG: No results for input(s): GLUCAP in the last 168 hours. Lipid Profile: No results for input(s): CHOL, HDL, LDLCALC, TRIG, CHOLHDL, LDLDIRECT in the last 72 hours. Thyroid Function Tests: No results for input(s): TSH, T4TOTAL, FREET4, T3FREE, THYROIDAB in the last 72 hours. Anemia Panel: No results for input(s): VITAMINB12, FOLATE, FERRITIN, TIBC, IRON, RETICCTPCT in the last 72 hours. Sepsis Labs: No results for input(s): PROCALCITON, LATICACIDVEN in the last 168 hours.  Recent Results (from the past 240 hour(s))  SARS CORONAVIRUS 2 (TAT 6-24 HRS) Nasopharyngeal Nasopharyngeal Swab     Status: None   Collection Time: 02/17/21 12:27 AM   Specimen: Nasopharyngeal Swab  Result Value Ref Range Status   SARS Coronavirus 2 NEGATIVE NEGATIVE Final    Comment: (NOTE) SARS-CoV-2 target nucleic acids are NOT DETECTED.  The SARS-CoV-2 RNA is generally detectable in upper and lower respiratory specimens during the acute phase of infection. Negative results do not preclude  SARS-CoV-2 infection, do not rule out co-infections with other pathogens, and should not be used as the sole basis for treatment or other patient management decisions. Negative results must be combined with clinical observations, patient history, and epidemiological information. The expected result is Negative.  Fact Sheet for Patients: HairSlick.no  Fact Sheet for Healthcare Providers: quierodirigir.com  This test is not yet approved or cleared by the [place] FDA and  has been authorized for detection and/or diagnosis of SARS-CoV-2 by FDA under an Emergency Use Authorization (EUA). This EUA will remain  in effect (meaning this test can be used) for the duration of the COVID-19 declaration under Se ction 564(b)(1) of the Act, 21 U.S.C. section 360bbb-3(b)(1), unless the authorization is terminated or revoked sooner.  Performed at [facility], [address], [place], [place] [address]   Aerobic/Anaerobic Culture w Gram Stain (surgical/deep wound)     Status: None (Preliminary result)   Collection Time: 02/18/21 12:22 PM   Specimen: Pleural Fluid  Result Value Ref Range Status   Specimen Description PLEURAL FLUID  Final   Special Requests LUNG LEFT  Final   Gram Stain   Final    MODERATE WBC PRESENT,BOTH PMN AND MONONUCLEAR NO ORGANISMS SEEN    Culture   Final    NO GROWTH 3 DAYS NO ANAEROBES ISOLATED; CULTURE IN PROGRESS FOR 5 DAYS Performed at [facility], [address], [place], [place] [address]    Report Status PENDING  Incomplete     Radiology Studies: DG Chest Port 1 View  Result Date: 02/21/2021 CLINICAL DATA:  LEFT side pain, chest tube EXAM: PORTABLE CHEST 1 VIEW COMPARISON:  Portable exam 1230 hours compared to 02/19/2021 FINDINGS: Pigtail LEFT thoracostomy tube again identified. Enlargement of cardiac silhouette. Mediastinal contours and pulmonary vascularity normal. Bibasilar atelectasis.  No acute infiltrate, pleural effusion, or pneumothorax. IMPRESSION: LEFT thoracostomy tube without pneumothorax. Enlargement of cardiac silhouette with bibasilar atelectasis. Electronically Signed   By: Ulyses Southward M.D.   On: 02/21/2021 14:32    Scheduled Meds: . enoxaparin (LOVENOX) injection  40 mg Subcutaneous Daily  . FLUoxetine  40 mg Oral Daily  . irbesartan  150 mg Oral Daily  . nicotine  21 mg Transdermal Daily  . QUEtiapine  300 mg Oral QHS  . QUEtiapine  50 mg Oral QHS  . sodium chloride flush  3 mL Intravenous Q12H   Continuous Infusions: . sodium chloride    .  sodium chloride 75 mL/hr at 02/21/21 0039  . clindamycin (CLEOCIN) IV 600 mg (02/21/21 1420)     LOS: 5 days   Rickey Barbara, MD [facility] Pager On Amion  If 7PM-7AM, please contact night-coverage 02/21/2021, 4:43 PM

## 2021-02-22 ENCOUNTER — Inpatient Hospital Stay (HOSPITAL_COMMUNITY): Payer: Self-pay

## 2021-02-22 DIAGNOSIS — J90 Pleural effusion, not elsewhere classified: Principal | ICD-10-CM

## 2021-02-22 DIAGNOSIS — J869 Pyothorax without fistula: Secondary | ICD-10-CM

## 2021-02-22 LAB — COMPREHENSIVE METABOLIC PANEL
ALT: 25 U/L (ref 0–44)
AST: 17 U/L (ref 15–41)
Albumin: 2.7 g/dL — ABNORMAL LOW (ref 3.5–5.0)
Alkaline Phosphatase: 81 U/L (ref 38–126)
Anion gap: 10 (ref 5–15)
BUN: 20 mg/dL (ref 6–20)
CO2: 28 mmol/L (ref 22–32)
Calcium: 8.8 mg/dL — ABNORMAL LOW (ref 8.9–10.3)
Chloride: 99 mmol/L (ref 98–111)
Creatinine, Ser: 1.46 mg/dL — ABNORMAL HIGH (ref 0.61–1.24)
GFR, Estimated: >60 mL/min (ref >60)
Glucose, Bld: 94 mg/dL (ref 70–99)
Potassium: 4.3 mmol/L (ref 3.5–5.1)
Sodium: 137 mmol/L (ref 135–145)
Total Bilirubin: 0.4 mg/dL (ref 0.3–1.2)
Total Protein: 6.7 g/dL (ref 6.5–8.1)

## 2021-02-22 LAB — CBC
HCT: 31.8 % — ABNORMAL LOW (ref 39.0–52.0)
Hemoglobin: 10.4 g/dL — ABNORMAL LOW (ref 13.0–17.0)
MCH: 29.3 pg (ref 26.0–34.0)
MCHC: 32.7 g/dL (ref 30.0–36.0)
MCV: 89.6 fL (ref 80.0–100.0)
Platelets: 348 10*3/uL (ref 150–400)
RBC: 3.55 MIL/uL — ABNORMAL LOW (ref 4.22–5.81)
RDW: 13.2 % (ref 11.5–15.5)
WBC: 7.5 10*3/uL (ref 4.0–10.5)
nRBC: 0 % (ref 0.0–0.2)

## 2021-02-22 MED ORDER — ALPRAZOLAM 0.5 MG PO TABS
1.0000 mg | ORAL_TABLET | Freq: Two times a day (BID) | ORAL | Status: DC | PRN
  Administered 2021-02-22: 1 mg via ORAL
  Filled 2021-02-22: qty 2

## 2021-02-22 NOTE — Plan of Care (Signed)
  Problem: Clinical Measurements: Goal: Diagnostic test results will improve Outcome: Progressing   Problem: Nutrition: Goal: Adequate nutrition will be maintained Outcome: Progressing   Problem: Pain Managment: Goal: General experience of comfort will improve Outcome: Progressing   

## 2021-02-22 NOTE — Discharge Instructions (Signed)
IR Chest Tube Puncture Site Dressing Care    Vaseline gauze to the area 24 hrs then may change to regular gauze dressing for additional 3 days; may shower after 24 hrs.  May take a shower with regular dressing on, take the dressing off after shower and pat dry the site. Then place a new, dry dressing.

## 2021-02-22 NOTE — Plan of Care (Signed)

## 2021-02-22 NOTE — Progress Notes (Addendum)
Referring Physician(s): Dr. Rhona Leavens, S.   Supervising Physician: Marliss Coots  Patient Status:  [facility] - In-pt  Chief Complaint:  S/p left chest placement on 02/18/21 for loculated pleural effusion   Subjective:  Pt laying in bed, not in acute distress.  States that left chest still sore, breathing has been improving. Wanting to go home.    Allergies: Haldol [haloperidol lactate], Haloperidol, Tylenol [acetaminophen], and Risperidone  Medications: Prior to Admission medications   Medication Sig Start Date End Date Taking? Authorizing Provider  ARIPiprazole (ABILIFY) 10 MG tablet Take 1 tablet (10 mg total) by mouth at bedtime. 07/27/19  Yes Aldean Baker, NP  FLUoxetine (PROZAC) 20 MG capsule Take 1 capsule (20 mg total) by mouth daily. 07/27/19  Yes Aldean Baker, NP  nicotine (NICODERM CQ - DOSED IN MG/24 HOURS) 21 mg/24hr patch Place 1 patch (21 mg total) onto the skin daily. 07/28/19  Yes Aldean Baker, NP  QUEtiapine (SEROQUEL) 400 MG tablet Take 400 mg by mouth at bedtime.   Yes [provider]  traZODone (DESYREL) 150 MG tablet Take 1 tablet (150 mg total) by mouth at bedtime as needed for sleep. 07/27/19  Yes Aldean Baker, NP  gabapentin (NEURONTIN) 300 MG capsule Take 1 capsule (300 mg total) by mouth 3 (three) times daily. Patient not taking: No sig reported 07/27/19   Aldean Baker, NP  hydrOXYzine (ATARAX/VISTARIL) 25 MG tablet Take 1 tablet (25 mg total) by mouth 3 (three) times daily as needed for anxiety. Patient not taking: No sig reported 07/27/19   Aldean Baker, NP  loratadine (CLARITIN) 10 MG tablet Take 1 tablet (10 mg total) by mouth daily. Patient not taking: No sig reported 07/28/19   Aldean Baker, NP  neomycin-bacitracin-polymyxin (NEOSPORIN) ointment Apply topically 2 (two) times daily. Patient not taking: No sig reported 07/27/19   Aldean Baker, NP  pantoprazole (PROTONIX) 40 MG tablet Take 1 tablet (40 mg total) by mouth daily. Patient not  taking: No sig reported 07/28/19   Aldean Baker, NP  sodium chloride (OCEAN) 0.65 % SOLN nasal spray Place 1 spray into both nostrils as needed for congestion. Patient not taking: No sig reported 07/27/19   Aldean Baker, NP     Vital Signs: BP 108/60 (BP Location: Left Arm)   Pulse 88   Temp 98.2 F (36.8 C) (Axillary)   Resp 15   SpO2 95%   Physical Exam Vitals reviewed.  Constitutional:      General: He is not in acute distress.    Appearance: He is not ill-appearing.  HENT:     Head: Normocephalic and atraumatic.  Cardiovascular:     Rate and Rhythm: Normal rate.     Pulses: Normal pulses.  Pulmonary:     Effort: Pulmonary effort is normal.     Comments: Mildly decreased lung sound on LLL  Abdominal:     Palpations: Abdomen is soft.  Musculoskeletal:     Cervical back: Neck supple.  Skin:    General: Skin is warm and dry.     Coloration: Skin is not pale.  Neurological:     Mental Status: He is alert and oriented to person, place, and time.  Psychiatric:        Mood and Affect: Mood normal.        Behavior: Behavior normal.        Judgment: Judgment normal.     Imaging: CT CHEST WO CONTRAST  Result Date: 02/21/2021 CLINICAL DATA:  Pneumonia.  Effusion or abscess suspected. EXAM: CT CHEST WITHOUT CONTRAST TECHNIQUE: Multidetector CT imaging of the chest was performed following the standard protocol without IV contrast. COMPARISON:  02/16/2021 FINDINGS: Cardiovascular: Normal heart size.  No pericardial effusion. Mediastinum/Nodes: No enlarged mediastinal or axillary lymph nodes. Thyroid gland, trachea, and esophagus demonstrate no significant findings. Lungs/Pleura: Interval placement of a percutaneous chest tube into the posteromedial pleural space overlying the left lower lobe. The previously noted loculated pleural effusion has resolved. No pneumothorax. Airspace consolidation involving the posterior and basal left lower lobe is identified compatible with  pneumonia. Subsegmental atelectasis noted within the superior segment of left lower lobe and lingula. There is also mild atelectasis within the right middle lobe and right lung base. No pneumothorax identified Upper Abdomen: No acute abnormality. Musculoskeletal: No chest wall mass or suspicious bone lesions identified. IMPRESSION: 1. Interval placement of percutaneous chest tube into the posteromedial pleural space overlying the left lower lobe. The previously noted loculated pleural effusion has resolved. 2. Left lower lobe airspace consolidation compatible with pneumonia. Electronically Signed   By: Signa Kell M.D.   On: 02/21/2021 19:54   DG Chest Port 1 View  Result Date: 02/21/2021 CLINICAL DATA:  LEFT side pain, chest tube EXAM: PORTABLE CHEST 1 VIEW COMPARISON:  Portable exam 1230 hours compared to 02/19/2021 FINDINGS: Pigtail LEFT thoracostomy tube again identified. Enlargement of cardiac silhouette. Mediastinal contours and pulmonary vascularity normal. Bibasilar atelectasis. No acute infiltrate, pleural effusion, or pneumothorax. IMPRESSION: LEFT thoracostomy tube without pneumothorax. Enlargement of cardiac silhouette with bibasilar atelectasis. Electronically Signed   By: Ulyses Southward M.D.   On: 02/21/2021 14:32   DG Chest Port 1 View  Result Date: 02/19/2021 CLINICAL DATA:  Status post pigtail thoracostomy tube placement on the left for treatment of loculated left pleural effusion. EXAM: PORTABLE CHEST 1 VIEW COMPARISON:  02/16/2021 chest x-ray at [facility] FINDINGS: Stable heart size. Left mid chest posterior thoracostomy pigtail catheter present. Decrease in apparent pleural fluid since the prior chest x-ray with improved aeration of the left lung. Areas of residual atelectasis and or infiltrate remain in the left lower lung. Mild atelectasis at the right lung base. No pneumothorax. IMPRESSION: Decrease in apparent pleural fluid after thoracostomy tube placement. Improved aeration of  the left lung with residual atelectasis/infiltrate remaining. No pneumothorax. Electronically Signed   By: Irish Lack M.D.   On: 02/19/2021 12:31    Labs:  CBC: Recent Labs    02/19/21 0212 02/20/21 0440 02/21/21 0419 02/22/21 0622  WBC 8.4 9.9 9.3 7.5  HGB 10.4* 11.0* 10.8* 10.4*  HCT 31.9* 34.0* 33.2* 31.8*  PLT 416* 416* 412* 348    COAGS: No results for input(s): INR, APTT in the last 8760 hours.  BMP: Recent Labs    02/19/21 0212 02/20/21 0440 02/21/21 0419 02/22/21 0622  NA 137 137 136 137  K 4.0 4.1 4.4 4.3  CL 96* 96* 97* 99  CO2 30 30 27 28   GLUCOSE 143* 126* 105* 94  BUN 16 17 22* 20  CALCIUM 9.1 9.2 9.1 8.8*  CREATININE 1.41* 1.58* 1.63* 1.46*  GFRNONAA >60 57* 55* >60    LIVER FUNCTION TESTS: Recent Labs    02/19/21 0212 02/20/21 0440 02/21/21 0419 02/22/21 0622  BILITOT 0.4 0.3 0.2* 0.4  AST 27 23 17 17   ALT 27 30 27 25   ALKPHOS 91 90 83 81  PROT 6.8 6.9 7.0 6.7  ALBUMIN 2.5* 2.6* 2.8* 2.7*  Assessment and Plan:  S/p left chest tube placement on 6/4 for loculated pleural effusion   Follow up CT on 02/21/21 showed:  1. Interval placement of percutaneous chest tube into the posteromedial pleural space overlying the left lower lobe. The previously noted loculated pleural effusion has resolved. 2. Left lower lobe airspace consolidation compatible with pneumonia.  OP has been less than 20 cc x 3 days.  Discussed with Dr. Elby Showers, ok to remove the CT.   CT was removed at the bedside with no complication.  Vaseline gauze placed over the puncture site, the gauze was secured with Tegaderm. Post procedure CXR showed no significant pneumothorax, very tiny amount of air visible at the left apex. No intervention needed at this time, recommend obtaining STAT CXR if patient develops signs and symptoms of PTX such as dyspnea or chest pain.   Vaseline gauze to the area 24 hrs then may change to regular gauze dressing for additional 3 days; may  shower after 24 hrs.  May take a shower with regular dressing on, take the dressing off after shower and pat dry the site. Then place a new, dry dressing.    Further treatment plan per TRH/CTS  Appreciate and agree with the plan.  Please call IR for questions and concerns regarding left CT.     Electronically Signed: Willette Brace, PA-C 02/22/2021, 2:25 PM   I spent a total of 25 Minutes at the the patient's bedside AND on the patient's hospital floor or unit, greater than 50% of which was counseling/coordinating care for left CT/ CT removal

## 2021-02-22 NOTE — Consult Note (Signed)
[address]       [place] [address]             [phone]        Myron Stankovich [facility] Medical Record #[id number] Date of Birth: Apr 29, 1981  Referring: No ref. provider found Primary Care: Patient, No Pcp Per (Inactive) Primary Cardiologist:None  Chief Complaint:   No chief complaint on file. left chest pain  History of Present Illness:     40 yo man with extensive h/o substance abuse presented with left sided CP and SOB. Endorsed chills but no documented fevers. Eval demonstrated left effusion with thoracentesis suggestive of exudate. Transferred to [facility] from [place] for management. In the interim, he has had pigtail catheter inserted into loculated effusion with good effect. He has improved clinically, and f/u chest CT shows mild atelectasis.    Current Activity/ Functional Status: Patient will be independent with mobility/ambulation, transfers, ADL's, IADL's.   Zubrod Score: At the time of surgery this patient's most appropriate activity status/level should be described as: []     0    Normal activity, no symptoms []     1    Restricted in physical strenuous activity but ambulatory, able to do out light work []     2    Ambulatory and capable of self care, unable to do work activities, up and about                 more than 50%  Of the time                            []     3    Only limited self care, in bed greater than 50% of waking hours []     4    Completely disabled, no self care, confined to bed or chair []     5    Moribund  Past Medical History:  Diagnosis Date  . Anxiety   . Asthma   . CVA (cerebral infarction)    drug induced  . Depression   . Drug addiction (HCC)   . Hepatitis C   . Seizures (HCC)    drug induced    Past Surgical History:  Procedure Laterality Date  . ABDOMINAL SURGERY    . INCISION AND DRAINAGE OF WOUND Left 09/22/2017   Procedure: IRRIGATION AND DEBRIDEMENT WOUND LEFT ELBOW ANTECUBITAL SPACE;  Surgeon: , MD;  Location: [facility];  Service: Orthopedics;  Laterality: Left;    Social History   Tobacco Use  Smoking Status Current Every Day Smoker  . Packs/day: 2.00  . Types: Cigarettes  Smokeless Tobacco Never Used    Social History   Substance and Sexual Activity  Alcohol Use Yes  . Alcohol/week: 1.0 standard drink  . Types: 1 Cans of beer per week   Comment: one cse beer daily     Allergies  Allergen Reactions  . Haldol [Haloperidol Lactate] Anaphylaxis and Swelling  . Haloperidol Anaphylaxis and Swelling  . Tylenol [Acetaminophen] Other (See Comments)    Patient chooses to limit his intake of this  . Risperidone Anxiety and Other (See Comments)    Dyskinesia, also    Current Facility-Administered Medications  Medication Dose Route Frequency Provider Last Rate Last Admin  . 0.9 %  sodium chloride infusion  250 mL Intravenous PRN Chotiner, , MD      . 0.9 %  sodium chloride infusion  Intravenous Continuous Jerald Kiefhiu, Stephen K, MD 75 mL/hr at 02/21/21 1709 1,000 mL at 02/21/21 1709  . albuterol (VENTOLIN HFA) 108 (90 Base) MCG/ACT inhaler 2 puff  2 puff Inhalation Q4H PRN Chotiner, Claudean SeveranceBradley S, MD      . clindamycin (CLEOCIN) IVPB 600 mg  600 mg Intravenous Q8H Chotiner, Claudean SeveranceBradley S, MD 100 mL/hr at 02/22/21 0531 600 mg at 02/22/21 0531  . enoxaparin (LOVENOX) injection 40 mg  40 mg Subcutaneous Daily Chotiner, Claudean SeveranceBradley S, MD   40 mg at 02/21/21 0917  . FLUoxetine (PROZAC) capsule 40 mg  40 mg Oral Daily Jerald Kiefhiu, Stephen K, MD   40 mg at 02/21/21 0900  . hydrOXYzine (ATARAX/VISTARIL) tablet 25 mg  25 mg Oral TID PRN Jerald Kiefhiu, Stephen K, MD   25 mg at 02/21/21 1439  . irbesartan (AVAPRO) tablet 150 mg  150 mg Oral Daily Chotiner, Claudean SeveranceBradley S, MD   150 mg at 02/21/21 1021  . nicotine (NICODERM CQ - dosed in mg/24 hours) patch 21 mg  21 mg Transdermal Daily Chotiner, Claudean SeveranceBradley S, MD   21 mg at 02/21/21 0917  . ondansetron (ZOFRAN) tablet 4 mg  4 mg Oral Q6H PRN Chotiner, Claudean SeveranceBradley S, MD    4 mg at 02/21/21 2239   Or  . ondansetron (ZOFRAN) injection 4 mg  4 mg Intravenous Q6H PRN Chotiner, Claudean SeveranceBradley S, MD   4 mg at 02/21/21 1419  . oxyCODONE (Oxy IR/ROXICODONE) immediate release tablet 5 mg  5 mg Oral Q4H PRN Jerald Kiefhiu, Stephen K, MD   5 mg at 02/21/21 2001  . QUEtiapine (SEROQUEL) tablet 300 mg  300 mg Oral QHS Jerald Kiefhiu, Stephen K, MD   300 mg at 02/21/21 2239  . QUEtiapine (SEROQUEL) tablet 50 mg  50 mg Oral QHS Jerald Kiefhiu, Stephen K, MD   50 mg at 02/21/21 1100  . senna-docusate (Senokot-S) tablet 1 tablet  1 tablet Oral QHS PRN Chotiner, Claudean SeveranceBradley S, MD      . sodium chloride flush (NS) 0.9 % injection 3 mL  3 mL Intravenous Q12H Chotiner, Claudean SeveranceBradley S, MD   3 mL at 02/21/21 0900  . sodium chloride flush (NS) 0.9 % injection 3 mL  3 mL Intravenous PRN Chotiner, Claudean SeveranceBradley S, MD      . zolpidem (AMBIEN) tablet 5 mg  5 mg Oral QHS PRN Jerald Kiefhiu, Stephen K, MD   5 mg at 02/21/21 2239    Medications Prior to Admission  Medication Sig Dispense Refill Last Dose  . ARIPiprazole (ABILIFY) 10 MG tablet Take 1 tablet (10 mg total) by mouth at bedtime. 30 tablet 0 Past Month at Unknown time  . FLUoxetine (PROZAC) 20 MG capsule Take 1 capsule (20 mg total) by mouth daily. 30 capsule 0 Past Month at Unknown time  . nicotine (NICODERM CQ - DOSED IN MG/24 HOURS) 21 mg/24hr patch Place 1 patch (21 mg total) onto the skin daily. 28 patch 0 SEE NOTE  . QUEtiapine (SEROQUEL) 400 MG tablet Take 400 mg by mouth at bedtime.   Past Month at Unknown time  . traZODone (DESYREL) 150 MG tablet Take 1 tablet (150 mg total) by mouth at bedtime as needed for sleep. 30 tablet 0 Past Month at Unknown time  . gabapentin (NEURONTIN) 300 MG capsule Take 1 capsule (300 mg total) by mouth 3 (three) times daily. (Patient not taking: No sig reported) 90 capsule 0 Not Taking at Unknown time  . hydrOXYzine (ATARAX/VISTARIL) 25 MG tablet Take 1 tablet (25 mg total) by mouth  3 (three) times daily as needed for anxiety. (Patient not taking: No sig  reported) 30 tablet 0 Not Taking at Unknown time  . loratadine (CLARITIN) 10 MG tablet Take 1 tablet (10 mg total) by mouth daily. (Patient not taking: No sig reported) 30 tablet 0 Not Taking at Unknown time  . neomycin-bacitracin-polymyxin (NEOSPORIN) ointment Apply topically 2 (two) times daily. (Patient not taking: No sig reported) 15 g 0 Not Taking at Unknown time  . pantoprazole (PROTONIX) 40 MG tablet Take 1 tablet (40 mg total) by mouth daily. (Patient not taking: No sig reported) 30 tablet 0 Not Taking at Unknown time  . sodium chloride (OCEAN) 0.65 % SOLN nasal spray Place 1 spray into both nostrils as needed for congestion. (Patient not taking: No sig reported)  0 Not Taking at Unknown time    Family History  Problem Relation Age of Onset  . Diabetes Paternal Uncle   . Diabetes Paternal Grandmother   . Depression Mother   . Depression Father   . Alcoholism Other      Review of Systems:   ROS Pertinent items noted in HPI and remainder of comprehensive ROS otherwise negative.     Cardiac Review of Systems: Y or  [    ]= no  Chest Pain [    ]  Resting SOB [   ] Exertional SOB  [  ]  Orthopnea [  ]   Pedal Edema [   ]    Palpitations [  ] Syncope  [  ]   Presyncope [   ]  General Review of Systems: [Y] = yes [  ]=no Constitional: recent weight change [  ]; anorexia [  ]; fatigue [  ]; nausea [  ]; night sweats [  ]; fever [  ]; or chills [  ]                                                               Dental: Last Dentist visit:   Eye : blurred vision [  ]; diplopia [   ]; vision changes [  ];  Amaurosis fugax[  ]; Resp: cough [  ];  wheezing[  ];  hemoptysis[  ]; shortness of breath[  ]; paroxysmal nocturnal dyspnea[  ]; dyspnea on exertion[  ]; or orthopnea[  ];  GI:  gallstones[  ], vomiting[  ];  dysphagia[  ]; melena[  ];  hematochezia [  ]; heartburn[  ];   Hx of  Colonoscopy[  ]; GU: kidney stones [  ]; hematuria[  ];   dysuria [  ];  nocturia[  ];  history of      obstruction [  ]; urinary frequency [  ]             Skin: rash, swelling[  ];, hair loss[  ];  peripheral edema[  ];  or itching[  ]; Musculosketetal: myalgias[  ];  joint swelling[  ];  joint erythema[  ];  joint pain[  ];  back pain[  ];  Heme/Lymph: bruising[  ];  bleeding[  ];  anemia[  ];  Neuro: TIA[  ];  headaches[  ];  stroke[  ];  vertigo[  ];  seizures[  ];   paresthesias[  ];  difficulty walking[  ];  Psych:depression[  ]; anxiety[  ];  Endocrine: diabetes[  ];  thyroid dysfunction[  ];        Physical Exam: BP 108/60 (BP Location: Left Arm)   Pulse 88   Temp 98.2 F (36.8 C) (Axillary)   Resp 15   SpO2 95%    General appearance: alert and cooperative Neck: no adenopathy, no carotid bruit, no JVD, supple, symmetrical, trachea midline and thyroid not enlarged, symmetric, no tenderness/mass/nodules Resp: clear to auscultation bilaterally Cardio: RRR GI: soft, non-tender; bowel sounds normal; no masses,  no organomegaly Extremities: extremities normal, atraumatic, no cyanosis or edema Neurologic: Alert and oriented X 3, normal strength and tone. Normal symmetric reflexes. Normal coordination and gait  Diagnostic Studies & Laboratory data:     Recent Radiology Findings:   CT CHEST WO CONTRAST  Result Date: 02/21/2021 CLINICAL DATA:  Pneumonia.  Effusion or abscess suspected. EXAM: CT CHEST WITHOUT CONTRAST TECHNIQUE: Multidetector CT imaging of the chest was performed following the standard protocol without IV contrast. COMPARISON:  02/16/2021 FINDINGS: Cardiovascular: Normal heart size.  No pericardial effusion. Mediastinum/Nodes: No enlarged mediastinal or axillary lymph nodes. Thyroid gland, trachea, and esophagus demonstrate no significant findings. Lungs/Pleura: Interval placement of a percutaneous chest tube into the posteromedial pleural space overlying the left lower lobe. The previously noted loculated pleural effusion has resolved. No pneumothorax. Airspace  consolidation involving the posterior and basal left lower lobe is identified compatible with pneumonia. Subsegmental atelectasis noted within the superior segment of left lower lobe and lingula. There is also mild atelectasis within the right middle lobe and right lung base. No pneumothorax identified Upper Abdomen: No acute abnormality. Musculoskeletal: No chest wall mass or suspicious bone lesions identified. IMPRESSION: 1. Interval placement of percutaneous chest tube into the posteromedial pleural space overlying the left lower lobe. The previously noted loculated pleural effusion has resolved. 2. Left lower lobe airspace consolidation compatible with pneumonia. Electronically Signed   By: Signa Kell M.D.   On: 02/21/2021 19:54   DG Chest Port 1 View  Result Date: 02/21/2021 CLINICAL DATA:  LEFT side pain, chest tube EXAM: PORTABLE CHEST 1 VIEW COMPARISON:  Portable exam 1230 hours compared to 02/19/2021 FINDINGS: Pigtail LEFT thoracostomy tube again identified. Enlargement of cardiac silhouette. Mediastinal contours and pulmonary vascularity normal. Bibasilar atelectasis. No acute infiltrate, pleural effusion, or pneumothorax. IMPRESSION: LEFT thoracostomy tube without pneumothorax. Enlargement of cardiac silhouette with bibasilar atelectasis. Electronically Signed   By: Ulyses Southward M.D.   On: 02/21/2021 14:32     I have independently reviewed the above radiologic studies and discussed with the patient   Recent Lab Findings: Lab Results  Component Value Date   WBC 7.5 02/22/2021   HGB 10.4 (L) 02/22/2021   HCT 31.8 (L) 02/22/2021   PLT 348 02/22/2021   GLUCOSE 94 02/22/2021   CHOL 150 07/24/2019   TRIG 78 07/24/2019   HDL 32 (L) 07/24/2019   LDLCALC 102 (H) 07/24/2019   ALT 25 02/22/2021   AST 17 02/22/2021   NA 137 02/22/2021   K 4.3 02/22/2021   CL 99 02/22/2021   CREATININE 1.46 (H) 02/22/2021   BUN 20 02/22/2021   CO2 28 02/22/2021   TSH 0.843 07/24/2019   HGBA1C 5.5  07/24/2019      Assessment / Plan:      Good response to percutaneous drainage of relatively simple loculated left empyema. Agree with drain removal. No indication for thoracic surgical procedure.    I  spent 15 minutes counseling the patient face to face.   Kaaren Nass Z. Vickey Sages, MD [phone] 02/22/2021 7:43 AM

## 2021-02-22 NOTE — Progress Notes (Signed)
Pt. Requested to leave AMA. Nurse explained the risk of leaving AMA. MD made aware. PT stated he felt better and he didn't see any reason to stay, the nurse explain that his chest tube was just removed today and and that fluid could build back up, but pt said he still wanted to leave. AMA paperwork signed.

## 2021-02-22 NOTE — Progress Notes (Addendum)
PROGRESS NOTE    John FlockRobert [name]  ZOX:[id number] DOB: 02-Jul-1981 DOA: 02/16/2021 PCP: Patient, No Pcp Per (Inactive)   Brief Narrative:  40 y.o.malewith medical history significant forsubstance abuse, affective disorder, previous stroke secondary to drug use, hepatitis C. He was admitted to [facility] on 5/26/22with a large left-sided pleural effusion. Reported symptoms started on 5/23/22with left-sided lateral chest wall pain with shortness of breath. He had been in the emergency room the day before admission but left AMA. He returned on May 26-22 with worsening pain and shortness of breath and CT scan at that time revealed a larger pleural effusion with mild cardiomegaly. That time he been having some chills but no fever and had no other symptoms. He did have a mild cough that was nonproductive. He reported the pain was a constant dull pain with intermittent sharp episodes of pain. He had no history of cardiac disease and no history of DVTs or PE. CT angiography chest was performed with no PE identified but patient had enlarging pleural effusion. He was admitted to [facility] and underwent thoracentesis with interventional radiology. Fluid studies at that time showed a nucleated cell count of 993 with 75% neutrophils and LDH of 3000. Cultures initially did not show any infection. Final culture results were not available at this time. He was initially placed on vancomycin and Zosyn for antibiotic coverage. Antibiotic was changed to clindamycin secondary to worsening renal function. Renal function has been slowly improving since antibiotic regimen was changed. On February 16, 2021 patient was continued as shortness of breath and requiring oxygen and repeat chest x-ray revealed an enlarging pleural effusion on the left again. Patient was being followed by pulmonology at [facility] and they recommended patient be evaluated by cardiothoracic surgery for decortication. Pt was  [facility] eval   Assessment & Plan:   Principal Problem:   Pleural effusion on left Active Problems:   Schizoaffective disorder, bipolar type (HCC)   Polysubstance dependence including opioid type drug with complication, continuous use (HCC)   AKI (acute kidney injury) (HCC)   Essential hypertension   Pleural effusion   Exudative left-sided pleural effusion, loculated -Status postthoracentesis x 2 by [facility] but due to reaccumulation transferred [facility].  No surgical intervention per CT surgery - IR following - CT chest without contrast- effusion resolved - Cultures remain negative.  Essential hypertension -Continue Avapro as tolerated.  Acute kidney injury -Baseline creatinine 0.8.  Admission creatinine 1.6.  Slowly improving.  Creatinine today 1.46 - Continue IV fluids  Schizoaffective disorder - Prozac, Seroquel  Polysubstance abuse - Counseled to quit using  Tobacco use - As needed nicotine patch  DVT prophylaxis: Lovenox Code Status: Full code Family Communication:  Fiance at bedside  Status is: Inpatient  Remains inpatient appropriate because:Inpatient level of care appropriate due to severity of illness   Dispo: The patient is from: Home              Anticipated d/c is to: Home              Patient currently is not medically stable to d/c.  Plans to discontinue drain today.  Monitor for 24 hours, IV fluids for AKI.  Hopefully home in next 24 hours   Difficult to place patient No      Subjective: Feels ok no new complaints.   Review of Systems Otherwise negative except as per HPI, including: General: Denies fever, chills, night sweats or unintended weight  loss. Resp: Denies cough, wheezing, shortness of breath. Cardiac: Denies chest pain, palpitations, orthopnea, paroxysmal nocturnal dyspnea. GI: Denies abdominal pain, nausea, vomiting, diarrhea or constipation GU: Denies dysuria, frequency,  hesitancy or incontinence MS: Denies muscle aches, joint pain or swelling Neuro: Denies headache, neurologic deficits (focal weakness, numbness, tingling), abnormal gait Psych: Denies anxiety, depression, SI/HI/AVH Skin: Denies new rashes or lesions ID: Denies sick contacts, exotic exposures, travel  Examination:  General exam: Appears calm and comfortable  Respiratory system: Clear to auscultation. Respiratory effort normal. Cardiovascular system: S1 & S2 heard, RRR. No JVD, murmurs, rubs, gallops or clicks. No pedal edema. Gastrointestinal system: Abdomen is nondistended, soft and nontender. No organomegaly or masses felt. Normal bowel sounds heard. Central nervous system: Alert and oriented. No focal neurological deficits. Extremities: Symmetric 5 x 5 power. Skin: No rashes, lesions or ulcers Psychiatry: Judgement and insight appear normal. Mood & affect appropriate.   Chest tube in place.   Objective: Vitals:   02/21/21 1200 02/21/21 1300 02/21/21 2003 02/22/21 0536  BP:   (!) 148/91 108/60  Pulse: 94 92 (!) 104 88  Resp: (!) 24 15 18 15   Temp:   98.6 F (37 C) 98.2 F (36.8 C)  TempSrc:   Axillary Axillary  SpO2: 94% 95% 92% 95%    Intake/Output Summary (Last 24 hours) at 02/22/2021 0917 Last data filed at 02/22/2021 0810 Gross per 24 hour  Intake 2006.86 ml  Output 4987 ml  Net -2980.14 ml   There were no vitals filed for this visit.   Data Reviewed:   CBC: Recent Labs  Lab 02/18/21 0233 02/19/21 0212 02/20/21 0440 02/21/21 0419 02/22/21 0622  WBC 9.0 8.4 9.9 9.3 7.5  HGB 10.3* 10.4* 11.0* 10.8* 10.4*  HCT 31.1* 31.9* 34.0* 33.2* 31.8*  MCV 88.6 88.9 89.9 89.5 89.6  PLT 388 416* 416* 412* 348   Basic Metabolic Panel: Recent Labs  Lab 02/18/21 0233 02/19/21 0212 02/20/21 0440 02/21/21 0419 02/22/21 0622  NA 136 137 137 136 137  K 4.3 4.0 4.1 4.4 4.3  CL 94* 96* 96* 97* 99  CO2 31 30 30 27 28   GLUCOSE 109* 143* 126* 105* 94  BUN 12 16 17  22* 20   CREATININE 1.42* 1.41* 1.58* 1.63* 1.46*  CALCIUM 9.3 9.1 9.2 9.1 8.8*   GFR: CrCl cannot be calculated (Unknown ideal weight.). Liver Function Tests: Recent Labs  Lab 02/18/21 0233 02/19/21 0212 02/20/21 0440 02/21/21 0419 02/22/21 0622  AST 24 27 23 17 17   ALT 22 27 30 27 25   ALKPHOS 90 91 90 83 81  BILITOT <0.1* 0.4 0.3 0.2* 0.4  PROT 7.1 6.8 6.9 7.0 6.7  ALBUMIN 2.5* 2.5* 2.6* 2.8* 2.7*   No results for input(s): LIPASE, AMYLASE in the last 168 hours. No results for input(s): AMMONIA in the last 168 hours. Coagulation Profile: No results for input(s): INR, PROTIME in the last 168 hours. Cardiac Enzymes: No results for input(s): CKTOTAL, CKMB, CKMBINDEX, TROPONINI in the last 168 hours. BNP (last 3 results) No results for input(s): PROBNP in the last 8760 hours. HbA1C: No results for input(s): HGBA1C in the last 72 hours. CBG: No results for input(s): GLUCAP in the last 168 hours. Lipid Profile: No results for input(s): CHOL, HDL, LDLCALC, TRIG, CHOLHDL, LDLDIRECT in the last 72 hours. Thyroid Function Tests: No results for input(s): TSH, T4TOTAL, FREET4, T3FREE, THYROIDAB in the last 72 hours. Anemia Panel: No results for input(s): VITAMINB12, FOLATE, FERRITIN, TIBC, IRON, RETICCTPCT in the last  72 hours. Sepsis Labs: No results for input(s): PROCALCITON, LATICACIDVEN in the last 168 hours.  Recent Results (from the past 240 hour(s))  SARS CORONAVIRUS 2 (TAT 6-24 HRS) Nasopharyngeal Nasopharyngeal Swab     Status: None   Collection Time: 02/17/21 12:27 AM   Specimen: Nasopharyngeal Swab  Result Value Ref Range Status   SARS Coronavirus 2 NEGATIVE NEGATIVE Final    Comment: (NOTE) SARS-CoV-2 target nucleic acids are NOT DETECTED.  The SARS-CoV-2 RNA is generally detectable in upper and lower respiratory specimens during the acute phase of infection. Negative results do not preclude SARS-CoV-2 infection, do not rule out co-infections with other pathogens, and  should not be used as the sole basis for treatment or other patient management decisions. Negative results must be combined with clinical observations, patient history, and epidemiological information. The expected result is Negative.  Fact Sheet for Patients: HairSlick.no  Fact Sheet for Healthcare Providers: quierodirigir.com  This test is not yet approved or cleared by the [place] FDA and  has been authorized for detection and/or diagnosis of SARS-CoV-2 by FDA under an Emergency Use Authorization (EUA). This EUA will remain  in effect (meaning this test can be used) for the duration of the COVID-19 declaration under Se ction 564(b)(1) of the Act, 21 U.S.C. section 360bbb-3(b)(1), unless the authorization is terminated or revoked sooner.  Performed at [facility], [address], [place], [place] [address]   Aerobic/Anaerobic Culture w Gram Stain (surgical/deep wound)     Status: None (Preliminary result)   Collection Time: 02/18/21 12:22 PM   Specimen: Pleural Fluid  Result Value Ref Range Status   Specimen Description PLEURAL FLUID  Final   Special Requests LUNG LEFT  Final   Gram Stain   Final    MODERATE WBC PRESENT,BOTH PMN AND MONONUCLEAR NO ORGANISMS SEEN    Culture   Final    NO GROWTH 3 DAYS NO ANAEROBES ISOLATED; CULTURE IN PROGRESS FOR 5 DAYS Performed at [facility], [address], [place], [place] [address]    Report Status PENDING  Incomplete         Radiology Studies: CT CHEST WO CONTRAST  Result Date: 02/21/2021 CLINICAL DATA:  Pneumonia.  Effusion or abscess suspected. EXAM: CT CHEST WITHOUT CONTRAST TECHNIQUE: Multidetector CT imaging of the chest was performed following the standard protocol without IV contrast. COMPARISON:  02/16/2021 FINDINGS: Cardiovascular: Normal heart size.  No pericardial effusion. Mediastinum/Nodes: No enlarged mediastinal or axillary lymph nodes.  Thyroid gland, trachea, and esophagus demonstrate no significant findings. Lungs/Pleura: Interval placement of a percutaneous chest tube into the posteromedial pleural space overlying the left lower lobe. The previously noted loculated pleural effusion has resolved. No pneumothorax. Airspace consolidation involving the posterior and basal left lower lobe is identified compatible with pneumonia. Subsegmental atelectasis noted within the superior segment of left lower lobe and lingula. There is also mild atelectasis within the right middle lobe and right lung base. No pneumothorax identified Upper Abdomen: No acute abnormality. Musculoskeletal: No chest wall mass or suspicious bone lesions identified. IMPRESSION: 1. Interval placement of percutaneous chest tube into the posteromedial pleural space overlying the left lower lobe. The previously noted loculated pleural effusion has resolved. 2. Left lower lobe airspace consolidation compatible with pneumonia. Electronically Signed   By: Signa Kell M.D.   On: 02/21/2021 19:54   DG Chest Port 1 View  Result Date: 02/21/2021 CLINICAL DATA:  LEFT side pain, chest tube EXAM: PORTABLE CHEST 1 VIEW COMPARISON:  Portable exam 1230 hours compared to 02/19/2021 FINDINGS: Pigtail LEFT thoracostomy tube again identified. Enlargement of cardiac silhouette. Mediastinal contours and pulmonary vascularity normal. Bibasilar atelectasis. No acute infiltrate, pleural effusion, or pneumothorax. IMPRESSION: LEFT thoracostomy tube without pneumothorax. Enlargement of cardiac silhouette with bibasilar atelectasis. Electronically Signed   By: Ulyses Southward M.D.   On: 02/21/2021 14:32        Scheduled Meds: . enoxaparin (LOVENOX) injection  40 mg Subcutaneous Daily  . FLUoxetine  40 mg Oral Daily  . irbesartan  150 mg Oral Daily  . nicotine  21 mg Transdermal Daily  . QUEtiapine  300 mg Oral QHS  . QUEtiapine  50 mg Oral QHS  . sodium chloride flush  3 mL Intravenous Q12H    Continuous Infusions: . sodium chloride    . sodium chloride 75 mL/hr at 02/22/21 0806  . clindamycin (CLEOCIN) IV 600 mg (02/22/21 0531)     LOS: 6 days   Time spent= 35 mins    Zyler Hyson Joline Maxcy, MD [facility]  If 7PM-7AM, please contact night-coverage  02/22/2021, 9:17 AM

## 2021-02-23 LAB — AEROBIC/ANAEROBIC CULTURE W GRAM STAIN (SURGICAL/DEEP WOUND): Culture: NO GROWTH

## 2021-02-23 NOTE — Discharge Summary (Addendum)
Physician Discharge Summary  John [name] OAC:[id number] DOB: Feb 22, 1981 DOA: 02/16/2021  PCP: Patient, No Pcp Per (Inactive)  Admit date: 02/16/2021 Discharge date: 02/22/2021  Admitted From: Transfer from [facility] Disposition: Left AMA    Brief/Interim Summary: 40 y.o. male with medical history significant for substance abuse, affective disorder, previous stroke secondary to drug use, hepatitis C. He was admitted to [facility] on 02/09/21 with a large left-sided pleural effusion.  Reported symptoms started on 02/06/21 with left-sided lateral chest wall pain with shortness of breath.  He had been in the emergency room the day before admission but left AMA.  He returned on May 26-22 with worsening pain and shortness of breath and CT scan at that time revealed a larger pleural effusion with mild cardiomegaly.  That time he been having some chills but no fever and had no other symptoms.  He did have a mild cough that was nonproductive.  He reported the pain was a constant dull pain with intermittent sharp episodes of pain.  He had no history of cardiac disease and no history of DVTs or PE.  CT angiography chest was performed with no PE identified but patient had enlarging pleural effusion.  He was admitted to [facility] and underwent thoracentesis with interventional radiology.  Fluid studies at that time showed a nucleated cell count of 993 with 75% neutrophils and LDH of 3000.  Cultures initially did not show any infection.  Final culture results were not available at this time.  He was initially placed on vancomycin and Zosyn for antibiotic coverage.  Antibiotic was changed to clindamycin secondary to worsening renal function.  Renal function has been slowly improving since antibiotic regimen was changed.  On February 16, 2021 patient was continued as shortness of breath and requiring oxygen and repeat chest x-ray revealed an enlarging pleural effusion on the left again.  Patient was being  followed by pulmonology at [facility] and they recommended patient be evaluated by cardiothoracic surgery for decortication. Pt was transferred to [facility] for CT surgical eval. Patient had a chest tube placed by IR which helped drain his loculated output and eventually removed on 02/22/2021.  Repeat CT resolution of his effusion.  Right after his procedure patient had a bleeding AGAINST MEDICAL ADVICE.   There is no height or weight on file to calculate BMI.         Discharge Diagnoses:  Principal Problem:   Pleural effusion on left Active Problems:   Schizoaffective disorder, bipolar type (HCC)   Polysubstance dependence including opioid type drug with complication, continuous use (HCC)   AKI (acute kidney injury) (HCC)   Essential hypertension   Pleural effusion     Discharge Exam: Vitals:   02/22/21 0536 02/22/21 1330  BP: 108/60 134/72  Pulse: 88 (!) 103  Resp: 15 16  Temp: 98.2 F (36.8 C) 98.1 F (36.7 C)  SpO2: 95% 96%   Vitals:   02/21/21 1300 02/21/21 2003 02/22/21 0536 02/22/21 1330  BP:  (!) 148/91 108/60 134/72  Pulse: 92 (!) 104 88 (!) 103  Resp: 15 18 15 16   Temp:  98.6 F (37 C) 98.2 F (36.8 C) 98.1 F (36.7 C)  TempSrc:  Axillary Axillary Oral  SpO2: 95% 92% 95% 96%     Discharge Instructions   Allergies as of 02/22/2021       Reactions   Haldol [haloperidol Lactate] Anaphylaxis, Swelling   Haloperidol Anaphylaxis, Swelling   Tylenol [acetaminophen] Other (See Comments)   Patient chooses  to limit his intake of this   Risperidone Anxiety, Other (See Comments)   Dyskinesia, also        Medication List     ASK your doctor about these medications    ARIPiprazole 10 MG tablet Commonly known as: ABILIFY Take 1 tablet (10 mg total) by mouth at bedtime.   FLUoxetine 20 MG capsule Commonly known as: PROZAC Take 1 capsule (20 mg total) by mouth daily.   gabapentin 300 MG capsule Commonly known as: NEURONTIN Take 1 capsule (300 mg  total) by mouth 3 (three) times daily.   hydrOXYzine 25 MG tablet Commonly known as: ATARAX/VISTARIL Take 1 tablet (25 mg total) by mouth 3 (three) times daily as needed for anxiety.   loratadine 10 MG tablet Commonly known as: CLARITIN Take 1 tablet (10 mg total) by mouth daily.   neomycin-bacitracin-polymyxin ointment Commonly known as: NEOSPORIN Apply topically 2 (two) times daily.   nicotine 21 mg/24hr patch Commonly known as: NICODERM CQ - dosed in mg/24 hours Place 1 patch (21 mg total) onto the skin daily.   pantoprazole 40 MG tablet Commonly known as: PROTONIX Take 1 tablet (40 mg total) by mouth daily.   QUEtiapine 400 MG tablet Commonly known as: SEROQUEL Take 400 mg by mouth at bedtime.   sodium chloride 0.65 % Soln nasal spray Commonly known as: OCEAN Place 1 spray into both nostrils as needed for congestion.   traZODone 150 MG tablet Commonly known as: DESYREL Take 1 tablet (150 mg total) by mouth at bedtime as needed for sleep.        Allergies  Allergen Reactions   Haldol [Haloperidol Lactate] Anaphylaxis and Swelling   Haloperidol Anaphylaxis and Swelling   Tylenol [Acetaminophen] Other (See Comments)    Patient chooses to limit his intake of this   Risperidone Anxiety and Other (See Comments)    Dyskinesia, also    You were cared for by a hospitalist during your hospital stay. If you have any questions about your discharge medications or the care you received while you were in the hospital after you are discharged, you can call the unit and asked to speak with the hospitalist on call if the hospitalist that took care of you is not available. Once you are discharged, your primary care physician will handle any further medical issues. Please note that no refills for any discharge medications will be authorized once you are discharged, as it is imperative that you return to your primary care physician (or establish a relationship with a primary care  physician if you do not have one) for your aftercare needs so that they can reassess your need for medications and monitor your lab values.   Procedures/Studies: CT CHEST WO CONTRAST  Result Date: 02/21/2021 CLINICAL DATA:  Pneumonia.  Effusion or abscess suspected. EXAM: CT CHEST WITHOUT CONTRAST TECHNIQUE: Multidetector CT imaging of the chest was performed following the standard protocol without IV contrast. COMPARISON:  02/16/2021 FINDINGS: Cardiovascular: Normal heart size.  No pericardial effusion. Mediastinum/Nodes: No enlarged mediastinal or axillary lymph nodes. Thyroid gland, trachea, and esophagus demonstrate no significant findings. Lungs/Pleura: Interval placement of a percutaneous chest tube into the posteromedial pleural space overlying the left lower lobe. The previously noted loculated pleural effusion has resolved. No pneumothorax. Airspace consolidation involving the posterior and basal left lower lobe is identified compatible with pneumonia. Subsegmental atelectasis noted within the superior segment of left lower lobe and lingula. There is also mild atelectasis within the right middle lobe and right  lung base. No pneumothorax identified Upper Abdomen: No acute abnormality. Musculoskeletal: No chest wall mass or suspicious bone lesions identified. IMPRESSION: 1. Interval placement of percutaneous chest tube into the posteromedial pleural space overlying the left lower lobe. The previously noted loculated pleural effusion has resolved. 2. Left lower lobe airspace consolidation compatible with pneumonia. Electronically Signed   By: Signa Kell M.D.   On: 02/21/2021 19:54   DG Chest Port 1 View  Result Date: 02/22/2021 CLINICAL DATA:  Left chest tube removed. EXAM: PORTABLE CHEST 1 VIEW COMPARISON:  02/21/2021 FINDINGS: Left chest tube is been removed. Mild atelectasis persists at the left lung base. Very tiny amount of pleural air at the left apex, barely perceptible. No significant  pneumothorax. Mild atelectasis persists at the right lung base. IMPRESSION: Left chest tube removed. No significant pneumothorax. Very tiny amount of air visible at the left apex, not significant. Bibasilar atelectasis as seen previously. Electronically Signed   By: Paulina Fusi M.D.   On: 02/22/2021 16:24   DG Chest Port 1 View  Result Date: 02/21/2021 CLINICAL DATA:  LEFT side pain, chest tube EXAM: PORTABLE CHEST 1 VIEW COMPARISON:  Portable exam 1230 hours compared to 02/19/2021 FINDINGS: Pigtail LEFT thoracostomy tube again identified. Enlargement of cardiac silhouette. Mediastinal contours and pulmonary vascularity normal. Bibasilar atelectasis. No acute infiltrate, pleural effusion, or pneumothorax. IMPRESSION: LEFT thoracostomy tube without pneumothorax. Enlargement of cardiac silhouette with bibasilar atelectasis. Electronically Signed   By: Ulyses Southward M.D.   On: 02/21/2021 14:32   DG Chest Port 1 View  Result Date: 02/19/2021 CLINICAL DATA:  Status post pigtail thoracostomy tube placement on the left for treatment of loculated left pleural effusion. EXAM: PORTABLE CHEST 1 VIEW COMPARISON:  02/16/2021 chest x-ray at [facility] FINDINGS: Stable heart size. Left mid chest posterior thoracostomy pigtail catheter present. Decrease in apparent pleural fluid since the prior chest x-ray with improved aeration of the left lung. Areas of residual atelectasis and or infiltrate remain in the left lower lung. Mild atelectasis at the right lung base. No pneumothorax. IMPRESSION: Decrease in apparent pleural fluid after thoracostomy tube placement. Improved aeration of the left lung with residual atelectasis/infiltrate remaining. No pneumothorax. Electronically Signed   By: Irish Lack M.D.   On: 02/19/2021 12:31   CT IMAGE GUIDED DRAINAGE BY PERCUTANEOUS CATHETER  Result Date: 02/18/2021 CLINICAL DATA:  Loculated left pleural fluid collection/empyema requiring catheter drainage. EXAM: CT GUIDED LEFT  THORACOSTOMY TUBE PLACEMENT ANESTHESIA/SEDATION: 5.0 mg IV Versed 200 mcg IV Fentanyl Total Moderate Sedation Time:  29 minutes The patient's level of consciousness and physiologic status were continuously monitored during the procedure by Radiology nursing. PROCEDURE: The procedure, risks, benefits, and alternatives were explained to the patient. Questions regarding the procedure were encouraged and answered. The patient understands and consents to the procedure. A time out was performed prior to initiating the procedure. CT was performed in a supine oblique position through the chest followed by a decubitus position with the left side up. The left posterior chest wall was prepped with chlorhexidine in a sterile fashion, and a sterile drape was applied covering the operative field. A sterile gown and sterile gloves were used for the procedure. Local anesthesia was provided with 1% Lidocaine. Under CT guidance, an 18 gauge trocar needle was advanced into the left posterior pleural space. After confirming needle tip position, a guidewire was advanced. The tract was dilated and a 12 French percutaneous drainage catheter advanced. A fluid sample was withdrawn and sent for culture  analysis. CT was performed to confirm catheter position. The catheter was connected to a Sahara Pleur-evac device and secured at the skin with a Prolene retention suture and StatLock device. COMPLICATIONS: None FINDINGS: Residual posterior loculated component left pleural fluid was targeted for catheter placement. There was return of clear, yellow fluid. The Pleur-evac device will be connected to wall suction at -20 cm of water. IMPRESSION: Left thoracostomy tube placement for within residual loculated posterior left pleural effusion/empyema. A 12 French drainage catheter was placed and attached to a Pleur-evac device which will be connected to wall suction. A sample of fluid was sent for culture analysis. Electronically Signed   By: Irish Lack M.D.   On: 02/18/2021 13:16     The results of significant diagnostics from this hospitalization (including imaging, microbiology, ancillary and laboratory) are listed below for reference.     Microbiology: Recent Results (from the past 240 hour(s))  SARS CORONAVIRUS 2 (TAT 6-24 HRS) Nasopharyngeal Nasopharyngeal Swab     Status: None   Collection Time: 02/17/21 12:27 AM   Specimen: Nasopharyngeal Swab  Result Value Ref Range Status   SARS Coronavirus 2 NEGATIVE NEGATIVE Final    Comment: (NOTE) SARS-CoV-2 target nucleic acids are NOT DETECTED.  The SARS-CoV-2 RNA is generally detectable in upper and lower respiratory specimens during the acute phase of infection. Negative results do not preclude SARS-CoV-2 infection, do not rule out co-infections with other pathogens, and should not be used as the sole basis for treatment or other patient management decisions. Negative results must be combined with clinical observations, patient history, and epidemiological information. The expected result is Negative.  Fact Sheet for Patients: HairSlick.no  Fact Sheet for Healthcare Providers: quierodirigir.com  This test is not yet approved or cleared by the [place] FDA and  has been authorized for detection and/or diagnosis of SARS-CoV-2 by FDA under an Emergency Use Authorization (EUA). This EUA will remain  in effect (meaning this test can be used) for the duration of the COVID-19 declaration under Se ction 564(b)(1) of the Act, 21 U.S.C. section 360bbb-3(b)(1), unless the authorization is terminated or revoked sooner.  Performed at [facility], [address], [place], [place] [address]   Aerobic/Anaerobic Culture w Gram Stain (surgical/deep wound)     Status: None (Preliminary result)   Collection Time: 02/18/21 12:22 PM   Specimen: Pleural Fluid  Result Value Ref Range Status   Specimen Description  PLEURAL FLUID  Final   Special Requests LUNG LEFT  Final   Gram Stain   Final    MODERATE WBC PRESENT,BOTH PMN AND MONONUCLEAR NO ORGANISMS SEEN    Culture   Final    NO GROWTH 4 DAYS NO ANAEROBES ISOLATED; CULTURE IN PROGRESS FOR 5 DAYS Performed at [facility], [address], [place], [place] [address]    Report Status PENDING  Incomplete     Labs: BNP (last 3 results) No results for input(s): BNP in the last 8760 hours. Basic Metabolic Panel: Recent Labs  Lab 02/18/21 0233 02/19/21 0212 02/20/21 0440 02/21/21 0419 02/22/21 0622  NA 136 137 137 136 137  K 4.3 4.0 4.1 4.4 4.3  CL 94* 96* 96* 97* 99  CO2 GLUCOSE 109* 143* 126* 105* 94  BUN 22* 20  CREATININE 1.42* 1.41* 1.58* 1.63* 1.46*  CALCIUM 9.3 9.1 9.2 9.1 8.8*   Liver Function Tests: Recent Labs  Lab 02/18/21 0233 02/19/21 0212 02/20/21  [id number] 02/21/21 0419 02/22/21 0622  AST 24 27 23 17 17   ALT 22 27 30 27 25   ALKPHOS 90 91 90 83 81  BILITOT <0.1* 0.4 0.3 0.2* 0.4  PROT 7.1 6.8 6.9 7.0 6.7  ALBUMIN 2.5* 2.5* 2.6* 2.8* 2.7*   No results for input(s): LIPASE, AMYLASE in the last 168 hours. No results for input(s): AMMONIA in the last 168 hours. CBC: Recent Labs  Lab 02/18/21 0233 02/19/21 0212 02/20/21 0440 02/21/21 0419 02/22/21 0622  WBC 9.0 8.4 9.9 9.3 7.5  HGB 10.3* 10.4* 11.0* 10.8* 10.4*  HCT 31.1* 31.9* 34.0* 33.2* 31.8*  MCV 88.6 88.9 89.9 89.5 89.6  PLT 388 416* 416* 412* 348   Cardiac Enzymes: No results for input(s): CKTOTAL, CKMB, CKMBINDEX, TROPONINI in the last 168 hours. BNP: Invalid input(s): POCBNP CBG: No results for input(s): GLUCAP in the last 168 hours. D-Dimer No results for input(s): DDIMER in the last 72 hours. Hgb A1c No results for input(s): HGBA1C in the last 72 hours. Lipid Profile No results for input(s): CHOL, HDL, LDLCALC, TRIG, CHOLHDL, LDLDIRECT in the last 72 hours. Thyroid function studies No results for input(s): TSH,  T4TOTAL, T3FREE, THYROIDAB in the last 72 hours.  Invalid input(s): FREET3 Anemia work up No results for input(s): VITAMINB12, FOLATE, FERRITIN, TIBC, IRON, RETICCTPCT in the last 72 hours. Urinalysis    Component Value Date/Time   COLORURINE YELLOW 07/24/2019 1156   APPEARANCEUR CLEAR 07/24/2019 1156   APPEARANCEUR Hazy 08/07/2014 1236   LABSPEC 1.017 07/24/2019 1156   LABSPEC 1.025 08/07/2014 1236   PHURINE 6.0 07/24/2019 1156   GLUCOSEU NEGATIVE 07/24/2019 1156   GLUCOSEU Negative 08/07/2014 1236   HGBUR NEGATIVE 07/24/2019 1156   BILIRUBINUR NEGATIVE 07/24/2019 1156   BILIRUBINUR Negative 08/07/2014 1236   KETONESUR NEGATIVE 07/24/2019 1156   PROTEINUR NEGATIVE 07/24/2019 1156   NITRITE NEGATIVE 07/24/2019 1156   LEUKOCYTESUR NEGATIVE 07/24/2019 1156   LEUKOCYTESUR Negative 08/07/2014 1236   Sepsis Labs Invalid input(s): PROCALCITONIN,  WBC,  LACTICIDVEN Microbiology Recent Results (from the past 240 hour(s))  SARS CORONAVIRUS 2 (TAT 6-24 HRS) Nasopharyngeal Nasopharyngeal Swab     Status: None   Collection Time: 02/17/21 12:27 AM   Specimen: Nasopharyngeal Swab  Result Value Ref Range Status   SARS Coronavirus 2 NEGATIVE NEGATIVE Final    Comment: (NOTE) SARS-CoV-2 target nucleic acids are NOT DETECTED.  The SARS-CoV-2 RNA is generally detectable in upper and lower respiratory specimens during the acute phase of infection. Negative results do not preclude SARS-CoV-2 infection, do not rule out co-infections with other pathogens, and should not be used as the sole basis for treatment or other patient management decisions. Negative results must be combined with clinical observations, patient history, and epidemiological information. The expected result is Negative.  Fact Sheet for Patients: HairSlick.nohttps://www.fda.gov/media/138098/download  Fact Sheet for Healthcare Providers: quierodirigir.comhttps://www.fda.gov/media/138095/download  This test is not yet approved or cleared by the  [place] FDA and  has been authorized for detection and/or diagnosis of SARS-CoV-2 by FDA under an Emergency Use Authorization (EUA). This EUA will remain  in effect (meaning this test can be used) for the duration of the COVID-19 declaration under Se ction 564(b)(1) of the Act, 21 U.S.C. section 360bbb-3(b)(1), unless the authorization is terminated or revoked sooner.  Performed at [facility], [address] [place], [place] [id number]   Aerobic/Anaerobic Culture w Gram Stain (surgical/deep wound)     Status: None (Preliminary result)   Collection Time: 02/18/21 12:22 PM  Specimen: Pleural Fluid  Result Value Ref Range Status   Specimen Description PLEURAL FLUID  Final   Special Requests LUNG LEFT  Final   Gram Stain   Final    MODERATE WBC PRESENT,BOTH PMN AND MONONUCLEAR NO ORGANISMS SEEN    Culture   Final    NO GROWTH 4 DAYS NO ANAEROBES ISOLATED; CULTURE IN PROGRESS FOR 5 DAYS Performed at [facility], [address] [place], [place] [address]    Report Status PENDING  Incomplete     Time coordinating discharge:  I have spent 35 minutes face to face with the patient and on the ward discussing the patients care, assessment, plan and disposition with other care givers. >50% of the time was devoted counseling the patient about the risks and benefits of treatment/Discharge disposition and coordinating care.   SIGNED:   Dimple Nanas, MD  [facility] 02/23/2021, 12:18 PM   If 7PM-7AM, please contact night-coverage

## 2022-01-28 IMAGING — CT CT CHEST W/O CM
2 of 4 series · 15 of 36 positions shown, 18 images · non-contrast
Comparison: 02/16/2021

CLINICAL DATA: Pneumonia.  Effusion or abscess suspected.

EXAM:
CT CHEST WITHOUT CONTRAST
TECHNIQUE: Multidetector CT imaging of the chest was performed following the
standard protocol without IV contrast.

[Series 4: thorax 2.0 · axial · 0.98mm/px · z∈[-160,+154]mm · 12 of 177 slices shown, 15 images]
[im 10/177  mediastinal]
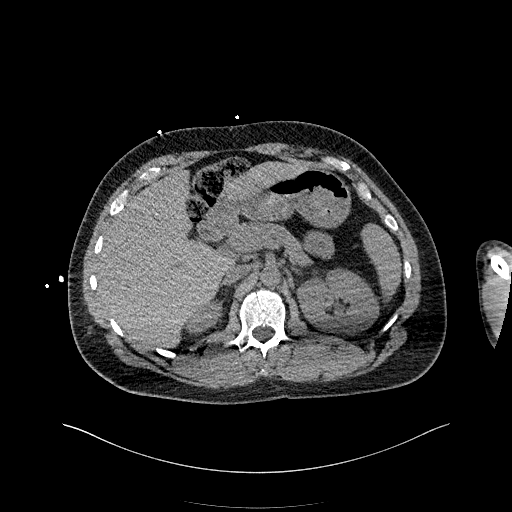
[im 10/177  lung]
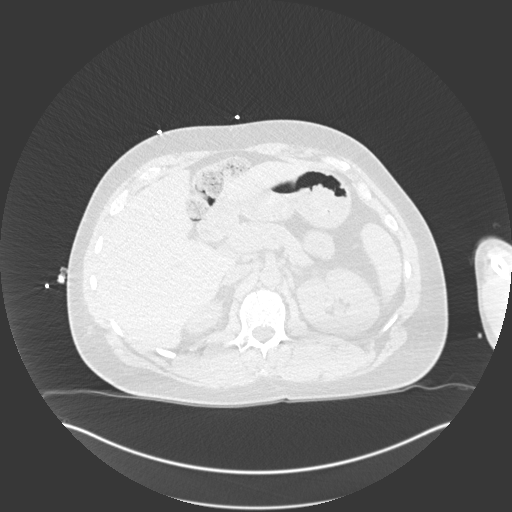
[im 28/177  lung]
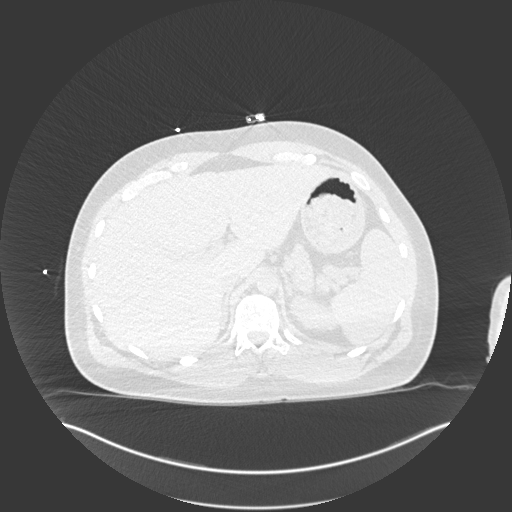
[im 38/177  lung]
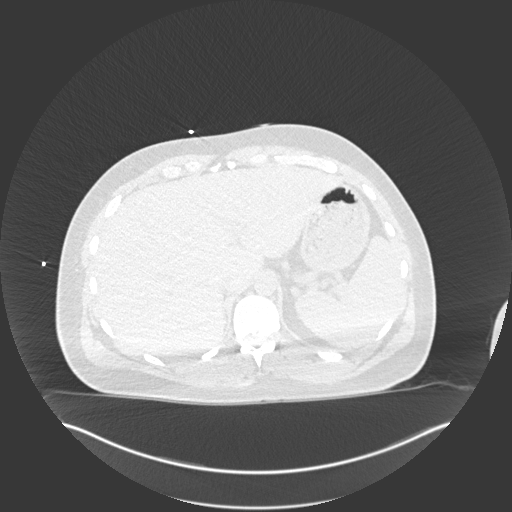
[im 56/177  lung]
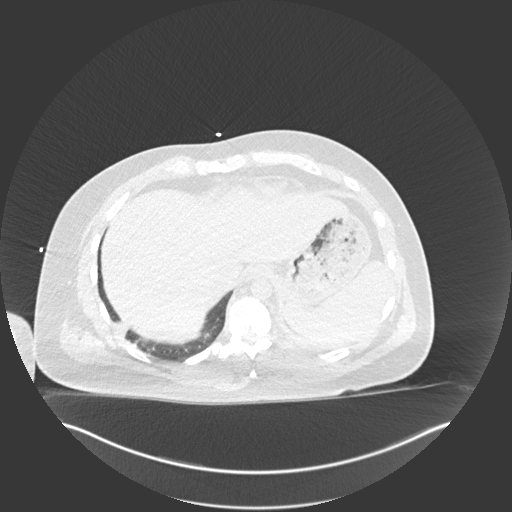
[im 65/177  mediastinal]
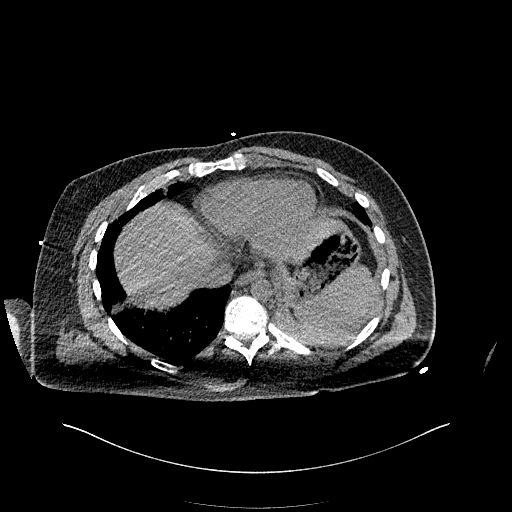
[im 65/177  lung]
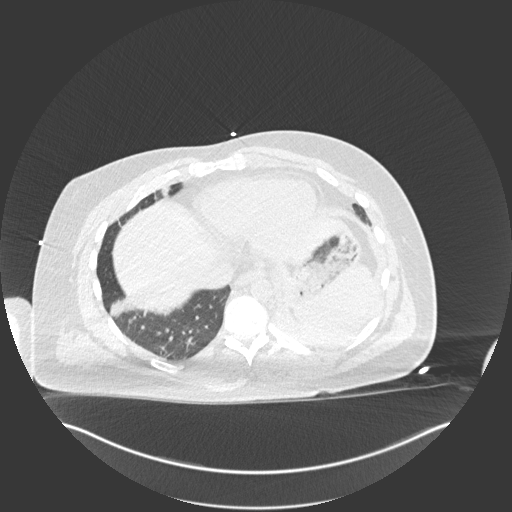
[im 84/177  lung]
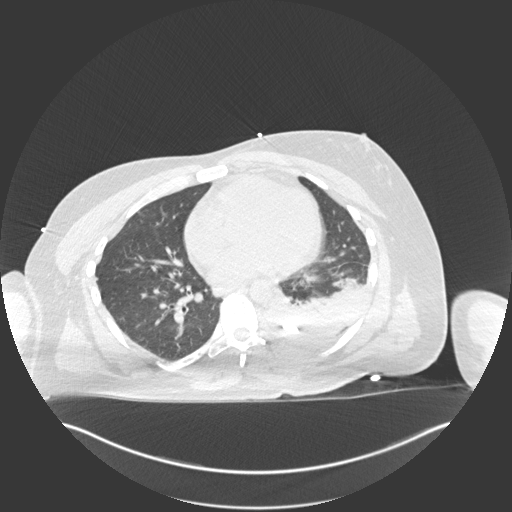
[im 93/177  lung]
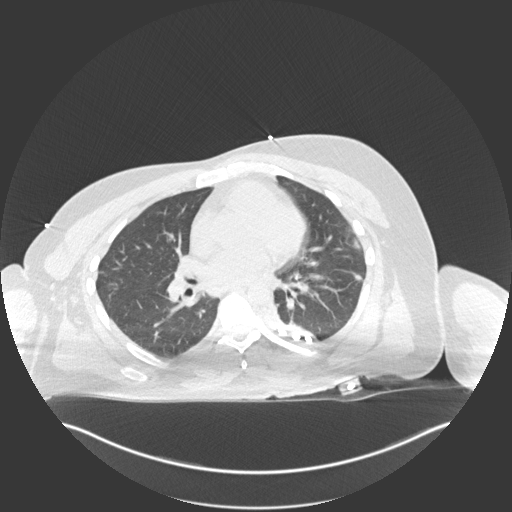
[im 112/177  lung]
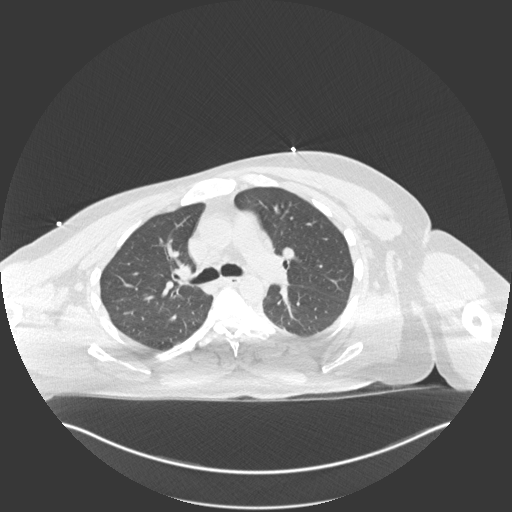
[im 121/177  mediastinal]
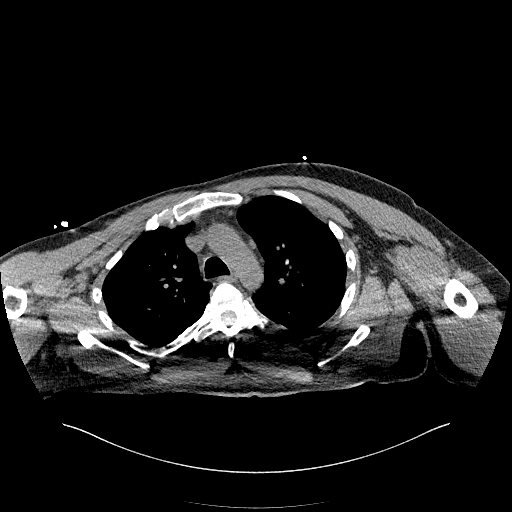
[im 121/177  lung]
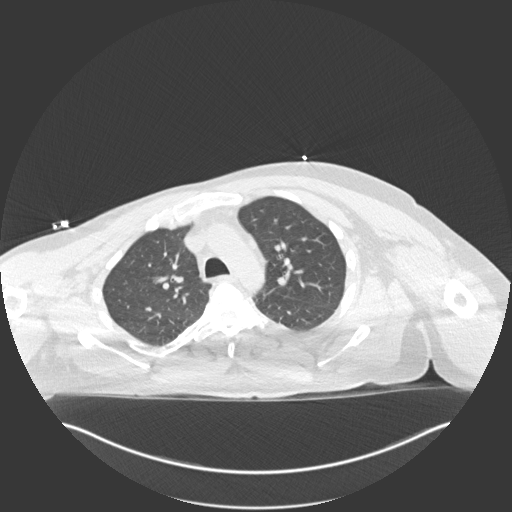
[im 139/177  lung]
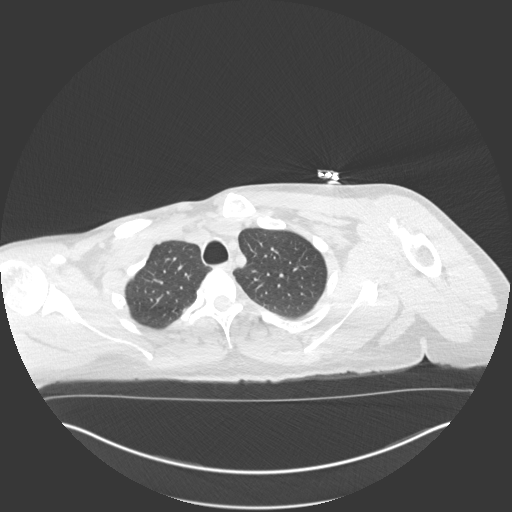
[im 149/177  lung]
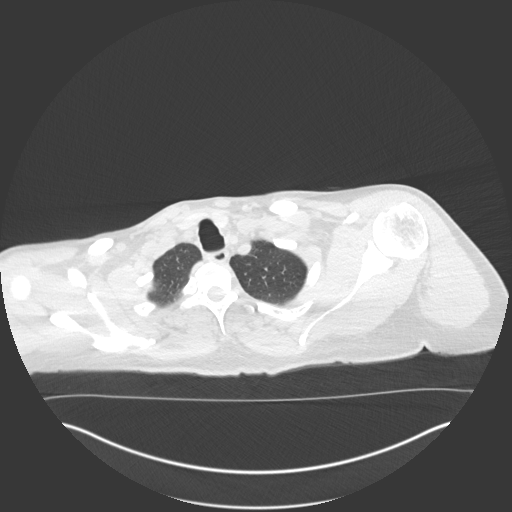
[im 167/177  lung]
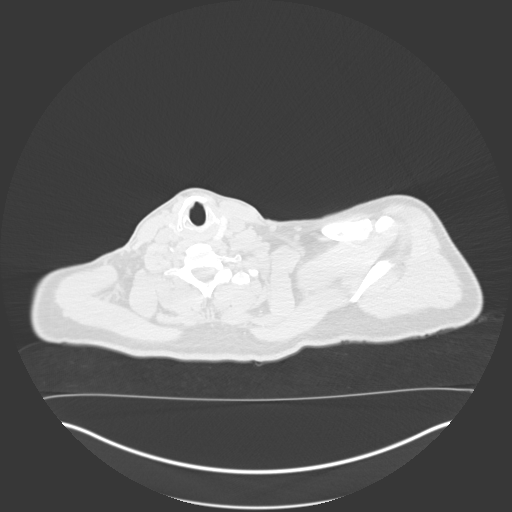

[Series 7: coronal · coronal · 0.69mm/px · 3 of 101 slices shown]
[im 21/101  lung]
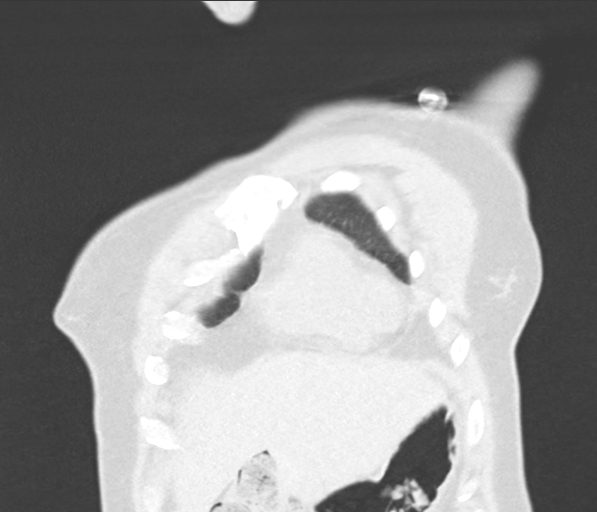
[im 41/101  lung]
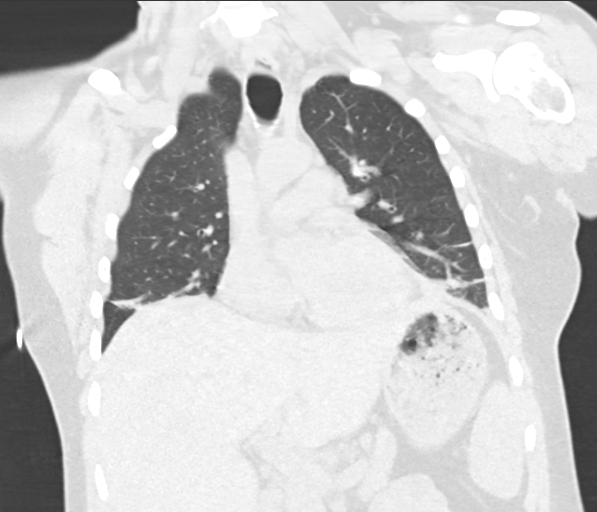
[im 61/101  lung]
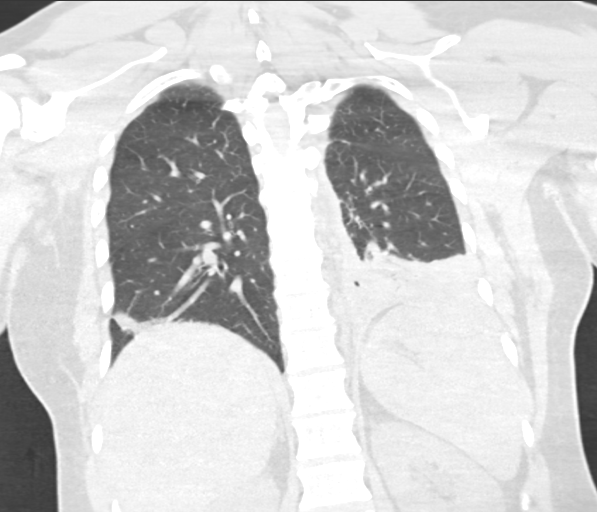

[15 of 36 positions shown; findings below may reference images not displayed]

FINDINGS: Cardiovascular: Normal heart size.  No pericardial effusion.

Mediastinum/Nodes: No enlarged mediastinal or axillary lymph nodes.
Thyroid gland, trachea, and esophagus demonstrate no significant
findings.

Lungs/Pleura: Interval placement of a percutaneous chest tube into
the posteromedial pleural space overlying the left lower lobe. The
previously noted loculated pleural effusion has resolved. No
pneumothorax. Airspace consolidation involving the posterior and
basal left lower lobe is identified compatible with pneumonia.
Subsegmental atelectasis noted within the superior segment of left
lower lobe and lingula. There is also mild atelectasis within the
right middle lobe and right lung base. No pneumothorax identified

Upper Abdomen: No acute abnormality.

Musculoskeletal: No chest wall mass or suspicious bone lesions
identified.
IMPRESSION: 1. Interval placement of percutaneous chest tube into the
posteromedial pleural space overlying the left lower lobe. The
previously noted loculated pleural effusion has resolved.
2. Left lower lobe airspace consolidation compatible with pneumonia.

## 2024-05-04 ENCOUNTER — Encounter (HOSPITAL_BASED_OUTPATIENT_CLINIC_OR_DEPARTMENT_OTHER): Payer: Self-pay | Admitting: Student

## 2024-05-04 ENCOUNTER — Ambulatory Visit (HOSPITAL_BASED_OUTPATIENT_CLINIC_OR_DEPARTMENT_OTHER): Payer: Self-pay | Admitting: Student

## 2024-05-04 VITALS — BP 129/83 | HR 107 | Temp 97.6°F | Resp 16 | Ht 69.49 in | Wt 211.9 lb

## 2024-05-04 DIAGNOSIS — F17200 Nicotine dependence, unspecified, uncomplicated: Secondary | ICD-10-CM | POA: Insufficient documentation

## 2024-05-04 DIAGNOSIS — Z7689 Persons encountering health services in other specified circumstances: Secondary | ICD-10-CM

## 2024-05-04 DIAGNOSIS — Z136 Encounter for screening for cardiovascular disorders: Secondary | ICD-10-CM

## 2024-05-04 DIAGNOSIS — Z13220 Encounter for screening for lipoid disorders: Secondary | ICD-10-CM

## 2024-05-04 DIAGNOSIS — F39 Unspecified mood [affective] disorder: Secondary | ICD-10-CM | POA: Diagnosis not present

## 2024-05-04 DIAGNOSIS — R7989 Other specified abnormal findings of blood chemistry: Secondary | ICD-10-CM | POA: Insufficient documentation

## 2024-05-04 DIAGNOSIS — Z23 Encounter for immunization: Secondary | ICD-10-CM | POA: Diagnosis not present

## 2024-05-04 DIAGNOSIS — Z131 Encounter for screening for diabetes mellitus: Secondary | ICD-10-CM

## 2024-05-04 DIAGNOSIS — J449 Chronic obstructive pulmonary disease, unspecified: Secondary | ICD-10-CM | POA: Diagnosis not present

## 2024-05-04 DIAGNOSIS — Z8673 Personal history of transient ischemic attack (TIA), and cerebral infarction without residual deficits: Secondary | ICD-10-CM | POA: Insufficient documentation
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
## 2024-05-04 NOTE — Patient Instructions (Addendum)
 It was nice to see you today!  As we discussed in clinic:  [facility] Address: [address], [place], [place] [address] Phone: [phone]  If you have any problems before your next visit feel free to message me via MyChart (minor issues or questions) or call the office, otherwise you may reach out to schedule an office visit.  Thank you! Lakeena Downie, PA-C

## 2024-05-05 ENCOUNTER — Ambulatory Visit (HOSPITAL_BASED_OUTPATIENT_CLINIC_OR_DEPARTMENT_OTHER): Payer: Self-pay | Admitting: Student

## 2024-05-05 LAB — CBC WITH DIFFERENTIAL/PLATELET
Basophils Absolute: 0 x10E3/uL (ref 0.0–0.2)
Basos: 1 %
EOS (ABSOLUTE): 0.3 x10E3/uL (ref 0.0–0.4)
Eos: 4 %
Hematocrit: 46 % (ref 37.5–51.0)
Hemoglobin: 15.5 g/dL (ref 13.0–17.7)
Immature Grans (Abs): 0.1 x10E3/uL (ref 0.0–0.1)
Immature Granulocytes: 1 %
Lymphocytes Absolute: 2.9 x10E3/uL (ref 0.7–3.1)
Lymphs: 36 %
MCH: 31.2 pg (ref 26.6–33.0)
MCHC: 33.7 g/dL (ref 31.5–35.7)
MCV: 93 fL (ref 79–97)
Monocytes Absolute: 0.7 x10E3/uL (ref 0.1–0.9)
Monocytes: 8 %
Neutrophils Absolute: 4 x10E3/uL (ref 1.4–7.0)
Neutrophils: 50 %
Platelets: 248 x10E3/uL (ref 150–450)
RBC: 4.97 x10E6/uL (ref 4.14–5.80)
RDW: 13.1 % (ref 11.6–15.4)
WBC: 7.9 x10E3/uL (ref 3.4–10.8)

## 2024-05-05 LAB — LIPID PANEL
Chol/HDL Ratio: 5.4 ratio — ABNORMAL HIGH (ref 0.0–5.0)
Cholesterol, Total: 217 mg/dL — ABNORMAL HIGH (ref 100–199)
HDL: 40 mg/dL (ref >39)
LDL Chol Calc (NIH): 142 mg/dL — ABNORMAL HIGH (ref 0–99)
Triglycerides: 191 mg/dL — ABNORMAL HIGH (ref 0–149)
VLDL Cholesterol Cal: 35 mg/dL (ref 5–40)

## 2024-05-05 LAB — COMPREHENSIVE METABOLIC PANEL WITH GFR
ALT: 21 IU/L (ref 0–44)
AST: 33 IU/L (ref 0–40)
Albumin: 4.7 g/dL (ref 4.1–5.1)
Alkaline Phosphatase: 104 IU/L (ref 44–121)
BUN/Creatinine Ratio: 21 — ABNORMAL HIGH (ref 9–20)
BUN: 21 mg/dL (ref 6–24)
Bilirubin Total: 0.3 mg/dL (ref 0.0–1.2)
CO2: 16 mmol/L — ABNORMAL LOW (ref 20–29)
Calcium: 9.6 mg/dL (ref 8.7–10.2)
Chloride: 103 mmol/L (ref 96–106)
Creatinine, Ser: 1 mg/dL (ref 0.76–1.27)
Globulin, Total: 3.1 g/dL (ref 1.5–4.5)
Glucose: 100 mg/dL — ABNORMAL HIGH (ref 70–99)
Potassium: 4.3 mmol/L (ref 3.5–5.2)
Sodium: 138 mmol/L (ref 134–144)
Total Protein: 7.8 g/dL (ref 6.0–8.5)
eGFR: 98 mL/min/1.73 (ref >59)

## 2024-05-05 LAB — HEMOGLOBIN A1C
Est. average glucose Bld gHb Est-mCnc: 120 mg/dL
Hgb A1c MFr Bld: 5.8 % — ABNORMAL HIGH (ref 4.8–5.6)

## 2024-05-06 ENCOUNTER — Encounter (HOSPITAL_BASED_OUTPATIENT_CLINIC_OR_DEPARTMENT_OTHER): Payer: Self-pay

## 2024-05-19 ENCOUNTER — Encounter (HOSPITAL_BASED_OUTPATIENT_CLINIC_OR_DEPARTMENT_OTHER): Payer: Self-pay

## 2024-05-20 ENCOUNTER — Ambulatory Visit (HOSPITAL_BASED_OUTPATIENT_CLINIC_OR_DEPARTMENT_OTHER): Admitting: Student

## 2024-05-20 ENCOUNTER — Encounter (HOSPITAL_BASED_OUTPATIENT_CLINIC_OR_DEPARTMENT_OTHER): Payer: Self-pay | Admitting: Student

## 2024-05-20 ENCOUNTER — Telehealth (HOSPITAL_BASED_OUTPATIENT_CLINIC_OR_DEPARTMENT_OTHER): Payer: Self-pay

## 2024-05-20 VITALS — BP 124/78 | HR 107 | Temp 98.1°F | Resp 16 | Ht 69.49 in | Wt 227.3 lb

## 2024-05-20 DIAGNOSIS — L03116 Cellulitis of left lower limb: Secondary | ICD-10-CM | POA: Diagnosis not present

## 2024-05-20 DIAGNOSIS — M1712 Unilateral primary osteoarthritis, left knee: Secondary | ICD-10-CM

## 2024-05-20 DIAGNOSIS — N179 Acute kidney failure, unspecified: Secondary | ICD-10-CM | POA: Diagnosis not present

## 2024-05-20 DIAGNOSIS — Z09 Encounter for follow-up examination after completed treatment for conditions other than malignant neoplasm: Secondary | ICD-10-CM | POA: Diagnosis not present

## 2024-05-20 DIAGNOSIS — N12 Tubulo-interstitial nephritis, not specified as acute or chronic: Secondary | ICD-10-CM | POA: Diagnosis not present

## 2024-05-20 DIAGNOSIS — R7303 Prediabetes: Secondary | ICD-10-CM

## 2024-05-20 DIAGNOSIS — M5489 Other dorsalgia: Secondary | ICD-10-CM

## 2024-05-20 LAB — POCT URINALYSIS DIPSTICK
Bilirubin, UA: NEGATIVE
Blood, UA: NEGATIVE
Glucose, UA: NEGATIVE
Ketones, UA: NEGATIVE
Leukocytes, UA: NEGATIVE
Nitrite, UA: NEGATIVE
Protein, UA: NEGATIVE
Spec Grav, UA: >=1.03 — AB (ref 1.010–1.025)
Urobilinogen, UA: 0.2 U/dL
pH, UA: 6.5 (ref 5.0–8.0)

## 2024-05-20 MED ORDER — TIZANIDINE HCL 4 MG PO TABS: 4.0000 mg | ORAL_TABLET | Freq: Four times a day (QID) | ORAL | 0 refills | Status: DC | PRN

## 2024-05-20 NOTE — Patient Instructions (Addendum)
 It was nice to see you today!  Please call the following behavioral health center to set an appointment:  [facility] Address: [address], [place], [place] [address] Phone: [phone]  If you have any problems before your next visit feel free to message me via MyChart (minor issues or questions) or call the office, otherwise you may reach out to schedule an office visit.  Thank you! Abad Manard, PA-C

## 2024-05-20 NOTE — Telephone Encounter (Signed)
 Discussed labs for pt since we were on the phone. He is also coming into today. Printed labs.
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
## 2024-05-21 ENCOUNTER — Ambulatory Visit (HOSPITAL_BASED_OUTPATIENT_CLINIC_OR_DEPARTMENT_OTHER): Payer: Self-pay | Admitting: Student

## 2024-05-21 DIAGNOSIS — R7401 Elevation of levels of liver transaminase levels: Secondary | ICD-10-CM

## 2024-05-21 LAB — COMPREHENSIVE METABOLIC PANEL WITH GFR
ALT: 72 IU/L — ABNORMAL HIGH (ref 0–44)
AST: 33 IU/L (ref 0–40)
Albumin: 4.6 g/dL (ref 4.1–5.1)
Alkaline Phosphatase: 114 IU/L (ref 44–121)
BUN/Creatinine Ratio: 24 — ABNORMAL HIGH (ref 9–20)
BUN: 23 mg/dL (ref 6–24)
Bilirubin Total: <0.2 mg/dL (ref 0.0–1.2)
CO2: 21 mmol/L (ref 20–29)
Calcium: 9.4 mg/dL (ref 8.7–10.2)
Chloride: 100 mmol/L (ref 96–106)
Creatinine, Ser: 0.96 mg/dL (ref 0.76–1.27)
Globulin, Total: 2.8 g/dL (ref 1.5–4.5)
Glucose: 104 mg/dL — ABNORMAL HIGH (ref 70–99)
Potassium: 4.4 mmol/L (ref 3.5–5.2)
Sodium: 138 mmol/L (ref 134–144)
Total Protein: 7.4 g/dL (ref 6.0–8.5)
eGFR: 102 mL/min/1.73 (ref >59)

## 2024-05-21 LAB — CBC WITH DIFFERENTIAL/PLATELET
Basophils Absolute: 0.1 x10E3/uL (ref 0.0–0.2)
Basos: 1 %
EOS (ABSOLUTE): 0.4 x10E3/uL (ref 0.0–0.4)
Eos: 4 %
Hematocrit: 43.3 % (ref 37.5–51.0)
Hemoglobin: 14.3 g/dL (ref 13.0–17.7)
Immature Grans (Abs): 0.1 x10E3/uL (ref 0.0–0.1)
Immature Granulocytes: 1 %
Lymphocytes Absolute: 2.7 x10E3/uL (ref 0.7–3.1)
Lymphs: 28 %
MCH: 31.3 pg (ref 26.6–33.0)
MCHC: 33 g/dL (ref 31.5–35.7)
MCV: 95 fL (ref 79–97)
Monocytes Absolute: 0.8 x10E3/uL (ref 0.1–0.9)
Monocytes: 8 %
Neutrophils Absolute: 5.8 x10E3/uL (ref 1.4–7.0)
Neutrophils: 58 %
Platelets: 262 x10E3/uL (ref 150–450)
RBC: 4.57 x10E6/uL (ref 4.14–5.80)
RDW: 13.4 % (ref 11.6–15.4)
WBC: 9.9 x10E3/uL (ref 3.4–10.8)

## 2024-06-01 ENCOUNTER — Ambulatory Visit (HOSPITAL_BASED_OUTPATIENT_CLINIC_OR_DEPARTMENT_OTHER)
Admission: EM | Admit: 2024-06-01 | Discharge: 2024-06-01 | Disposition: A | Discharge status: Home / Self Care | Attending: Family Medicine | Admitting: Family Medicine

## 2024-06-01 ENCOUNTER — Encounter (HOSPITAL_BASED_OUTPATIENT_CLINIC_OR_DEPARTMENT_OTHER): Payer: Self-pay

## 2024-06-01 ENCOUNTER — Other Ambulatory Visit (HOSPITAL_BASED_OUTPATIENT_CLINIC_OR_DEPARTMENT_OTHER): Payer: Self-pay

## 2024-06-01 DIAGNOSIS — B3742 Candidal balanitis: Secondary | ICD-10-CM

## 2024-06-01 DIAGNOSIS — R21 Rash and other nonspecific skin eruption: Secondary | ICD-10-CM | POA: Diagnosis present

## 2024-06-01 DIAGNOSIS — F17210 Nicotine dependence, cigarettes, uncomplicated: Secondary | ICD-10-CM | POA: Diagnosis not present

## 2024-06-01 DIAGNOSIS — B372 Candidiasis of skin and nail: Secondary | ICD-10-CM

## 2024-06-01 DIAGNOSIS — B001 Herpesviral vesicular dermatitis: Secondary | ICD-10-CM

## 2024-06-01 MED ORDER — VALACYCLOVIR HCL 1 G PO TABS
ORAL_TABLET | ORAL | 1 refills | Status: DC
  Filled 2024-06-01: qty 2, 1d supply, fill #0

## 2024-06-01 MED ORDER — FLUCONAZOLE 100 MG PO TABS
ORAL_TABLET | ORAL | 1 refills | Status: DC
  Filled 2024-06-01: qty 7, 7d supply, fill #0

## 2024-06-01 MED ORDER — NYSTATIN 100000 UNIT/GM EX CREA
TOPICAL_CREAM | CUTANEOUS | 0 refills | Status: DC
  Filled 2024-06-01: qty 30, 15d supply, fill #0

## 2024-06-01 NOTE — ED Triage Notes (Signed)
 Pt c/o penile rash/pain for over a week. He states it is red a raw. He is unsure if it was due to his underwear when he was working and sweating. Pt denies discharge or dysuria.

## 2024-06-01 NOTE — ED Provider Notes (Signed)
 [name]    CSN: [id number] Arrival date & time: 06/01/24  1207      History   Chief Complaint Chief Complaint  Patient presents with   Rash    HPI John [name] is a 43 y.o. male.   43 year old male who reports that he was recently in the hospital for a kidney infection and was on antibiotics until last week.  Not long after he finished the antibiotics he developed a significant red rash around his penis and scrotum.  It is raw and tender.  He has gotten out of prison in the last 6 months and he reports that his diet has been terrible and he has had a lot of irregular bowel movements and loose stools that he feels like relate to poor diet.  So he also had some irritation rectally due to the chronic diarrhea.  He really does not know what caused the penile rash but it is uncomfortable and tender.  He also reports that with his work he perspires a lot and sometimes his underwear and shorts are wet due to perspiration.  He denies any burning or frequency of urination nor penile discharge.  He has a new onset fever blister on the left upper lip near the corner of his mouth.  He is does not remember ever having a fever blister before.  He did drink after his son and his son has recently been diagnosed with herpes.  He wonders if he caught this from his son.   Rash Associated symptoms: no abdominal pain, no diarrhea, no fever, no joint pain, no nausea and not vomiting     Past Medical History:  Diagnosis Date   Anxiety    Asthma    CVA (cerebral infarction)    drug induced   Depression    Drug addiction (HCC)    History of hepatitis C    Seizures (HCC)    drug induced    Patient Active Problem List   Diagnosis Date Noted   Prediabetes 05/20/2024   Chronic obstructive pulmonary disease (HCC) 05/04/2024   Elevated LFTs 05/04/2024   Tobacco use disorder 05/04/2024   Mood disorder (HCC) 05/04/2024   History of ischemic stroke 05/04/2024   Pleural effusion on left  02/16/2021   AKI (acute kidney injury) (HCC) 02/16/2021   Essential hypertension 02/16/2021   Pleural effusion 02/16/2021   MDD (major depressive disorder), recurrent episode, severe (HCC) 07/23/2019   Suicidal intent 06/12/2019   Abscess of left upper extremity 09/18/2017   Hx of intrinsic asthma 09/18/2017   Polysubstance abuse (HCC) 09/18/2017   Depression with suicidal ideation 09/18/2017   Substance induced mood disorder (HCC) 09/18/2017   Neuroleptic induced acute dystonia 03/22/2016   Schizoaffective disorder, bipolar type (HCC) 03/20/2016   PTSD (post-traumatic stress disorder) 03/20/2016   Cocaine use disorder, moderate, dependence (HCC) 03/20/2016   Stimulant use disorder 03/20/2016   Cannabis use disorder, moderate, dependence (HCC) 03/20/2016   Alcohol use disorder, moderate, dependence (HCC) 03/20/2016   Polysubstance dependence including opioid type drug with complication, continuous use (HCC) 03/20/2016   Hepatitis C 10/13/2013    Past Surgical History:  Procedure Laterality Date   INCISION AND DRAINAGE OF WOUND Left 09/22/2017   Procedure: IRRIGATION AND DEBRIDEMENT WOUND LEFT ELBOW ANTECUBITAL SPACE;  Surgeon: Josefina Chew, MD;  Location: [facility];  Service: Orthopedics;  Laterality: Left;   UMBILICAL HERNIA REPAIR         Home Medications    Prior to Admission medications  Medication Sig Start Date End Date Taking? Authorizing Provider  amphetamine-dextroamphetamine (ADDERALL) 15 MG tablet Take 1 tablet by mouth 2 (two) times daily. 05/27/24  Yes [provider]  fluconazole  (DIFLUCAN ) 100 MG tablet Take one tablet daily for 7 days.  Wait one week.  If any symptoms of thrush remain, refill the medication and take daily for one more week. 06/01/24  Yes Ival Domino, FNP  nystatin  cream (MYCOSTATIN ) Apply thin amounts of the cream to the penis, scrotum and rectum, up to 4 times daily, if needed for itch and irritation. 06/01/24  Yes Ival Domino, FNP   valACYclovir  (VALTREX ) 1000 MG tablet Take 1 pill now and take another pill in 12 hours for acute treatment of a fever blister. 06/01/24  Yes Ival Domino, FNP  albuterol  (VENTOLIN  HFA) 108 (90 Base) MCG/ACT inhaler Inhale 2 puffs into the lungs every 4 (four) hours as needed for wheezing or shortness of breath.    [provider]  cefUROXime (CEFTIN) 500 MG tablet Take 500 mg by mouth 2 (two) times daily. 05/15/24   [provider]  gabapentin  (NEURONTIN ) 300 MG capsule Take 300 mg by mouth 3 (three) times daily. 05/15/24   [provider]  linezolid (ZYVOX) 600 MG tablet Take 600 mg by mouth 2 (two) times daily. 05/15/24   [provider]  omeprazole (PRILOSEC) 20 MG capsule Take 20 mg by mouth daily. Patient taking differently: Take 20 mg by mouth as needed. 05/01/24   [provider]  tiZANidine  (ZANAFLEX ) 4 MG tablet Take 1 tablet (4 mg total) by mouth every 6 (six) hours as needed for muscle spasms. 05/20/24   Rothfuss, Lang DASEN, PA-C    Family History Family History  Problem Relation Age of Onset   Depression Mother    Depression Father    Drug abuse Sister        Fentanyl  overdose   Diabetes Paternal Uncle    Diabetes Paternal Grandmother    Alcoholism Other     Social History Social History   Tobacco Use   Smoking status: Every Day    Current packs/day: 0.50    Average packs/day: 0.5 packs/day for 31.7 years (15.9 ttl pk-yrs)    Types: Cigarettes    Start date: 1994    Passive exposure: Current   Smokeless tobacco: Former    Types: Associate Professor status: Some Days  Substance Use Topics   Alcohol use: Not Currently    Alcohol/week: 1.0 standard drink of alcohol    Types: 1 Cans of beer per week   Drug use: Not Currently    Types: Marijuana, Heroin    Comment: Opana, heroin, and pain medications     Allergies   Haldol  [haloperidol  lactate], Haloperidol , Aripiprazole , Tylenol  [acetaminophen ], and  Risperidone   Review of Systems Review of Systems  Constitutional:  Negative for fever.  Respiratory:  Negative for cough.   Cardiovascular:  Negative for chest pain.  Gastrointestinal:  Negative for abdominal pain, constipation, diarrhea, nausea and vomiting.  Genitourinary:  Positive for penile pain. Negative for penile discharge, penile swelling, scrotal swelling and testicular pain.  Musculoskeletal:  Negative for arthralgias and back pain.  Skin:  Positive for rash (Fever blister on left upper lip near the corner of his mouth.  Red irritated rash around his penis, scrotum and rectum). Negative for color change.  Neurological:  Negative for syncope.  All other systems reviewed and are negative.    Physical Exam Triage  Vital Signs ED Triage Vitals  Encounter Vitals Group     BP 06/01/24 1249 114/73     Girls Systolic BP Percentile --      Girls Diastolic BP Percentile --      Boys Systolic BP Percentile --      Boys Diastolic BP Percentile --      Pulse Rate 06/01/24 1249 (!) 102     Resp 06/01/24 1249 20     Temp 06/01/24 1249 97.7 F (36.5 C)     Temp Source 06/01/24 1249 Oral     SpO2 06/01/24 1249 95 %     Weight --      Height --      Head Circumference --      Peak Flow --      Pain Score 06/01/24 1247 10     Pain Loc --      Pain Education --      Exclude from Growth Chart --    No data found.  Updated Vital Signs BP 114/73 (BP Location: Right Arm)   Pulse (!) 102   Temp 97.7 F (36.5 C) (Oral)   Resp 20   SpO2 95%   Visual Acuity Right Eye Distance:   Left Eye Distance:   Bilateral Distance:    Right Eye Near:   Left Eye Near:    Bilateral Near:     Physical Exam Vitals and nursing note reviewed. Exam conducted with a chaperone present Johnnie Plana, Charity fundraiser).  Constitutional:      General: He is not in acute distress.    Appearance: He is well-developed. He is not ill-appearing or toxic-appearing.  HENT:     Head: Normocephalic and atraumatic.      Right Ear: Hearing, tympanic membrane, ear canal and external ear normal.     Left Ear: Hearing, tympanic membrane, ear canal and external ear normal.     Nose: No congestion or rhinorrhea.     Right Sinus: No maxillary sinus tenderness or frontal sinus tenderness.     Left Sinus: No maxillary sinus tenderness or frontal sinus tenderness.     Mouth/Throat:     Lips: Pink.     Mouth: Mucous membranes are moist.     Pharynx: Uvula midline. No oropharyngeal exudate or posterior oropharyngeal erythema.     Tonsils: No tonsillar exudate.     Comments: Left upper lip with a crusted erosion that is tender with collection of the specimen. Eyes:     Conjunctiva/sclera: Conjunctivae normal.     Pupils: Pupils are equal, round, and reactive to light.  Cardiovascular:     Rate and Rhythm: Normal rate and regular rhythm.     Heart sounds: S1 normal and S2 normal. No murmur heard. Pulmonary:     Effort: Pulmonary effort is normal. No respiratory distress.     Breath sounds: Normal breath sounds. No decreased breath sounds, wheezing, rhonchi or rales.  Abdominal:     General: Bowel sounds are normal.     Palpations: Abdomen is soft.     Tenderness: There is no abdominal tenderness.     Hernia: There is no hernia in the left inguinal area or right inguinal area.  Genitourinary:    Penis: Circumcised. Erythema and tenderness present. No phimosis, paraphimosis, hypospadias, discharge, swelling or lesions.      Testes:        Right: Mass, swelling, testicular hydrocele or varicocele not present. Right testis is descended. Cremasteric reflex is present.  Left: Mass, tenderness, swelling, testicular hydrocele or varicocele not present. Left testis is descended. Cremasteric reflex is present.      Epididymis:     Right: Not inflamed or enlarged. No mass or tenderness.     Left: Not inflamed or enlarged. No mass or tenderness.     Comments: Penis and scrotum with erythematous, moist  rash. Musculoskeletal:        General: No swelling.     Cervical back: Neck supple.  Lymphadenopathy:     Head:     Right side of head: No submental, submandibular, tonsillar, preauricular or posterior auricular adenopathy.     Left side of head: No submental, submandibular, tonsillar, preauricular or posterior auricular adenopathy.     Cervical: No cervical adenopathy.     Right cervical: No superficial cervical adenopathy.    Left cervical: No superficial cervical adenopathy.     Lower Body: No right inguinal adenopathy. No left inguinal adenopathy.  Skin:    General: Skin is warm and dry.     Capillary Refill: Capillary refill takes less than 2 seconds.     Findings: No rash.  Neurological:     Mental Status: He is alert and oriented to person, place, and time.  Psychiatric:        Mood and Affect: Mood normal.      UC Treatments / Results  Labs (all labs ordered are listed, but only abnormal results are displayed) Labs Reviewed  HSV 1/2 PCR (SURFACE)    EKG   Radiology No results found.  Procedures Procedures (including critical care time)  Medications Ordered in UC Medications - No data to display  Initial Impression / Assessment and Plan / UC Course  I have reviewed the triage vital signs and the nursing notes.  Pertinent labs & imaging results that were available during my care of the patient were reviewed by me and considered in my medical decision making (see chart for details).  Plan of Care: Yeast infection of the genitals: Fluconazole , 100 mg daily for 7 days.  Nystatin  cream, thin amounts to the genital areas up to 4 times daily as needed.  See discharge instructions for more information  Fever blister to the left side of the mouth: Viral culture taken and we will update the patient once the test results.  Valacyclovir  1000 mg now and repeat in 12 hours.  Follow-up if symptoms do not improve, worsen or new symptoms occur.  I reviewed the plan of  care with the patient and/or the patient's guardian.  The patient and/or guardian had time to ask questions and acknowledged that the questions were answered.  I provided instruction on symptoms or reasons to return here or to go to an ER, if symptoms/condition did not improve, worsened or if new symptoms occurred.  Final Clinical Impressions(s) / UC Diagnoses   Final diagnoses:  Fever blister  Candidal intertrigo  Candidal balanitis     Discharge Instructions      Yeast infection of the penis and scrotum: Fluconazole , 100 mg daily for 7 days.  Nystatin  cream, thin amounts to penis, scrotum and rectum up to 4 times daily for irritation and itch.  If rash does not completely resolve needs to see primary care or return here  Fever blister on left upper lip near the left side of the mouth: Sample taken to test for herpes.  Valacyclovir  1000 mg now and repeat in 12 hours.  Follow-up with primary care or return here if symptoms do  not improve, worsen or new symptoms occur.     ED Prescriptions     Medication Sig Dispense Auth. Provider   valACYclovir  (VALTREX ) 1000 MG tablet Take 1 pill now and take another pill in 12 hours for acute treatment of a fever blister. 2 tablet Sharnae Winfree, FNP   fluconazole  (DIFLUCAN ) 100 MG tablet Take one tablet daily for 7 days.  Wait one week.  If any symptoms of thrush remain, refill the medication and take daily for one more week. 7 tablet Dade Rodin, FNP   nystatin  cream (MYCOSTATIN ) Apply thin amounts of the cream to the penis, scrotum and rectum, up to 4 times daily, if needed for itch and irritation. 30 g Ival Domino, FNP      PDMP not reviewed this encounter.   Ival Domino, FNP 06/01/24 1341

## 2024-06-01 NOTE — Discharge Instructions (Addendum)
 Yeast infection of the penis and scrotum: Fluconazole , 100 mg daily for 7 days.  Nystatin  cream, thin amounts to penis, scrotum and rectum up to 4 times daily for irritation and itch.  If rash does not completely resolve needs to see primary care or return here  Fever blister on left upper lip near the left side of the mouth: Sample taken to test for herpes.  Valacyclovir  1000 mg now and repeat in 12 hours.  Follow-up with primary care or return here if symptoms do not improve, worsen or new symptoms occur.

## 2024-06-03 ENCOUNTER — Telehealth (HOSPITAL_BASED_OUTPATIENT_CLINIC_OR_DEPARTMENT_OTHER): Payer: Self-pay | Admitting: Family Medicine

## 2024-06-03 DIAGNOSIS — B001 Herpesviral vesicular dermatitis: Secondary | ICD-10-CM

## 2024-06-03 NOTE — Telephone Encounter (Signed)
 Reorder for HSV swab

## 2024-06-04 ENCOUNTER — Ambulatory Visit (HOSPITAL_BASED_OUTPATIENT_CLINIC_OR_DEPARTMENT_OTHER): Payer: Self-pay | Admitting: Family Medicine

## 2024-06-04 LAB — HSV 1/2 PCR (SURFACE)
HSV-1 DNA: DETECTED — AB
HSV-2 DNA: NOT DETECTED

## 2024-06-04 NOTE — Telephone Encounter (Signed)
 Copied from CRM [id number]. Topic: Clinical - Lab/Test Results >> Jun 04, 2024  3:21 PM John [name] wrote: Reason for CRM: Pt would like a call back regarding his labs/  Cb [phone]

## 2024-06-15 ENCOUNTER — Ambulatory Visit (HOSPITAL_BASED_OUTPATIENT_CLINIC_OR_DEPARTMENT_OTHER): Admitting: Student

## 2024-06-17 ENCOUNTER — Ambulatory Visit (HOSPITAL_BASED_OUTPATIENT_CLINIC_OR_DEPARTMENT_OTHER): Admitting: Student

## 2024-06-17 ENCOUNTER — Encounter (HOSPITAL_BASED_OUTPATIENT_CLINIC_OR_DEPARTMENT_OTHER): Payer: Self-pay | Admitting: Student

## 2024-06-17 VITALS — BP 131/82 | HR 103 | Temp 97.8°F | Resp 16 | Ht 69.49 in | Wt 222.3 lb

## 2024-06-17 DIAGNOSIS — J449 Chronic obstructive pulmonary disease, unspecified: Secondary | ICD-10-CM | POA: Diagnosis not present

## 2024-06-17 DIAGNOSIS — M545 Low back pain, unspecified: Secondary | ICD-10-CM

## 2024-06-17 DIAGNOSIS — R7303 Prediabetes: Secondary | ICD-10-CM

## 2024-06-17 DIAGNOSIS — R7401 Elevation of levels of liver transaminase levels: Secondary | ICD-10-CM | POA: Diagnosis not present

## 2024-06-17 DIAGNOSIS — G4761 Periodic limb movement disorder: Secondary | ICD-10-CM | POA: Diagnosis not present

## 2024-06-17 DIAGNOSIS — Z8739 Personal history of other diseases of the musculoskeletal system and connective tissue: Secondary | ICD-10-CM

## 2024-06-17 DIAGNOSIS — J410 Simple chronic bronchitis: Secondary | ICD-10-CM

## 2024-06-17 MED ORDER — ALBUTEROL SULFATE HFA 108 (90 BASE) MCG/ACT IN AERS: 2.0000 | INHALATION_SPRAY | RESPIRATORY_TRACT | 6 refills | Status: DC | PRN

## 2024-06-17 NOTE — Patient Instructions (Signed)
 It was nice to see you today!  If you have any problems before your next visit feel free to message me via MyChart (minor issues or questions) or call the office, otherwise you may reach out to schedule an office visit.  Thank you! Pau Banh, PA-C

## 2024-06-17 NOTE — Progress Notes (Signed)
 Established Patient Office Visit  Subjective   Patient ID: John [name], male    DOB: 12-07-1980  Age: 43 y.o. MRN: [id number]  Chief Complaint  Patient presents with   Medical Management of Chronic Issues    Follow up .  Gabapenting and tizanidine  d/c due to makes legs kick. Gabapentin  was also drying mouth out. Went to [facility] for yeast infection and fever blister. Got rx for valtrex      HPI  Discussed the use of AI scribe software for clinical note transcription with the patient, who gave verbal consent to proceed.  History of Present Illness   John [name] is a 43 year old male with chronic knee pain and prediabetes who presents for follow-up.  He has chronic knee pain that has recently improved, allowing him to avoid taking time off work. He has not visited an orthopedic specialist as previously suggested because the pain has subsided. He is not currently taking ibuprofen  for the knee pain.  He experiences occasional pain in the kidney area upon waking, which resolves after drinking water . His kidney function tests have returned to normal.  He has a history of elevated liver enzymes, with one enzyme still slightly elevated. He experienced a significant infection following antibiotic treatment, which was treated with clotrimazole cream, resolving the symptoms after three applications.  His cholesterol and triglycerides were previously elevated, and he is awaiting a recheck in two months. He has prediabetes with an A1c of 5.8, which was 5.7 eight years ago. He acknowledges a high intake of bread.  He has been prescribed Adderall, taking two 15 mg doses per day. He has stopped taking gabapentin  due to side effects of leg twitching, which has improved since discontinuation.  He experiences chronic back pain, particularly in the lower back, which worsens upon waking. He reports no radiation of the pain to the legs. He has not tried physical therapy or icing for  relief.  He uses an albuterol  inhaler for COPD, which he has not had for two weeks but reports it was effective when used. He has not reduced his cigarette consumption despite being aware of the potential for further COPD exacerbations.  He has a history of incarceration and currently works five days a week, ten hours a day. He has been smoking for over thirty years.      Patient Active Problem List   Diagnosis Date Noted   Prediabetes 05/20/2024   Chronic obstructive pulmonary disease (HCC) 05/04/2024   Elevated LFTs 05/04/2024   Tobacco use disorder 05/04/2024   Mood disorder 05/04/2024   History of ischemic stroke 05/04/2024   Pleural effusion on left 02/16/2021   AKI (acute kidney injury) 02/16/2021   Essential hypertension 02/16/2021   Pleural effusion 02/16/2021   MDD (major depressive disorder), recurrent episode, severe (HCC) 07/23/2019   Suicidal intent 06/12/2019   Abscess of left upper extremity 09/18/2017   Hx of intrinsic asthma 09/18/2017   Polysubstance abuse (HCC) 09/18/2017   Depression with suicidal ideation 09/18/2017   Substance induced mood disorder (HCC) 09/18/2017   Neuroleptic induced acute dystonia 03/22/2016   Schizoaffective disorder, bipolar type (HCC) 03/20/2016   PTSD (post-traumatic stress disorder) 03/20/2016   Cocaine use disorder, moderate, dependence (HCC) 03/20/2016   Stimulant use disorder 03/20/2016   Cannabis use disorder, moderate, dependence (HCC) 03/20/2016   Alcohol use disorder, moderate, dependence (HCC) 03/20/2016   Polysubstance dependence including opioid type drug with complication, continuous use (HCC) 03/20/2016   Hepatitis C 10/13/2013  Past Medical History:  Diagnosis Date   Anxiety    Asthma    CVA (cerebral infarction)    drug induced   Depression    Drug addiction (HCC)    History of hepatitis C    Seizures (HCC)    drug induced   Social History   Tobacco Use   Smoking status: Every Day    Current packs/day:  0.50    Average packs/day: 0.5 packs/day for 31.7 years (15.9 ttl pk-yrs)    Types: Cigarettes    Start date: 1994    Passive exposure: Current   Smokeless tobacco: Former    Types: Associate Professor status: Some Days  Substance Use Topics   Alcohol use: Not Currently    Alcohol/week: 1.0 standard drink of alcohol    Types: 1 Cans of beer per week   Drug use: Not Currently    Types: Marijuana, Heroin    Comment: Opana, heroin, and pain medications   Allergies  Allergen Reactions   Haldol  [Haloperidol  Lactate] Anaphylaxis and Swelling   Haloperidol  Anaphylaxis and Swelling   Aripiprazole     Tylenol  [Acetaminophen ] Other (See Comments)    Patient chooses to limit his intake of this   Risperidone Anxiety and Other (See Comments)    Dyskinesia, also      ROS Per HPI.    Objective:     BP 131/82   Pulse (!) 103   Temp 97.8 F (36.6 C) (Oral)   Resp 16   Ht 5' 9.49 (1.765 m)   Wt 222 lb 4.8 oz (100.8 kg)   SpO2 95%   BMI 32.37 kg/m  BP Readings from Last 3 Encounters:  06/17/24 131/82  06/01/24 114/73  05/20/24 124/78   Wt Readings from Last 3 Encounters:  06/17/24 222 lb 4.8 oz (100.8 kg)  05/20/24 227 lb 4.8 oz (103.1 kg)  05/04/24 211 lb 14.4 oz (96.1 kg)      Physical Exam Constitutional:      General: He is not in acute distress.    Appearance: Normal appearance. He is not ill-appearing.  HENT:     Head: Normocephalic and atraumatic.     Right Ear: External ear normal.     Left Ear: External ear normal.     Nose: Nose normal.  Eyes:     Conjunctiva/sclera: Conjunctivae normal.  Cardiovascular:     Rate and Rhythm: Normal rate and regular rhythm.     Pulses: Normal pulses.     Heart sounds: Normal heart sounds. No murmur heard.    No friction rub.  Pulmonary:     Effort: Pulmonary effort is normal. No respiratory distress.     Breath sounds: Normal breath sounds. No wheezing, rhonchi or rales.  Abdominal:     Tenderness: There is no  right CVA tenderness or left CVA tenderness.  Musculoskeletal:     Comments: Right lower back tenderness at paraspinous musculature.   Skin:    General: Skin is warm and dry.     Coloration: Skin is not jaundiced or pale.  Neurological:     Mental Status: He is alert.  Psychiatric:        Mood and Affect: Mood normal.        Behavior: Behavior normal.      No results found for any visits on 06/17/24.  Last CBC Lab Results  Component Value Date   WBC 9.9 05/20/2024   HGB 14.3 05/20/2024   HCT 43.3  05/20/2024   MCV 95 05/20/2024   MCH 31.3 05/20/2024   RDW 13.4 05/20/2024   PLT 262 05/20/2024   Last metabolic panel Lab Results  Component Value Date   GLUCOSE 104 (H) 05/20/2024   NA 138 05/20/2024   K 4.4 05/20/2024   CL 100 05/20/2024   CO2 21 05/20/2024   BUN 23 05/20/2024   CREATININE 0.96 05/20/2024   EGFR 102 05/20/2024   CALCIUM 9.4 05/20/2024   PROT 7.4 05/20/2024   ALBUMIN 4.6 05/20/2024   LABGLOB 2.8 05/20/2024   BILITOT <0.2 05/20/2024   ALKPHOS 114 05/20/2024   AST 33 05/20/2024   ALT 72 (H) 05/20/2024   ANIONGAP 10 02/22/2021   Last lipids Lab Results  Component Value Date   CHOL 217 (H) 05/04/2024   HDL 40 05/04/2024   LDLCALC 142 (H) 05/04/2024   TRIG 191 (H) 05/04/2024   CHOLHDL 5.4 (H) 05/04/2024   Last hemoglobin A1c Lab Results  Component Value Date   HGBA1C 5.8 (H) 05/04/2024    The 10-year ASCVD risk score (Arnett DK, et al., 2019) is: 6.6%    Assessment & Plan:   Assessment and Plan    Chronic low back pain, likely muscular Chronic low back pain, likely muscular in origin, with pain exacerbated by movements such as side bending and extension. No current radicular symptoms- negative SLR. Pain is improving since the hospital visit, suggesting recovery from muscle breakdown. - Advise icing the affected area - Consider physical therapy if pain persists  Chronic obstructive pulmonary disease with chronic bronchitis COPD with  chronic bronchitis, currently managed with albuterol  inhaler. Mild wheezing on examination. He has not used inhaler in two weeks but reports it was effective when used. - Prescribe albuterol  inhaler - Consider daily inhaler if albuterol  becomes ineffective  Tobacco use disorder Long-standing tobacco use disorder with no current reduction in smoking. Discussed the impact of smoking on COPD and potential benefits of cessation. He expressed a belief that he will reduce smoking when more severe health consequences arise. - Offer support for smoking cessation if desired  Prediabetes Prediabetes with A1c of 5.8, slightly increased from previous measurement of 5.7 eight years ago. No medication required at this stage. Emphasis on dietary modifications to reduce carbohydrate intake. - Advise reducing intake of high-carbohydrate foods such as bread, sugar, rice, pasta, and potatoes  Elevated liver enzyme (ALT), under evaluation One liver enzyme was elevated, likely due to stress from a previous illness. Other liver enzymes were normal. Plan to recheck liver function to ensure normalization. - Order hepatic function panel to recheck liver enzymes  History of acute kidney injury, resolved History of acute kidney injury, now resolved. Kidney function is currently normal.  History of rhabdomyolysis with muscle injury, resolved History of rhabdomyolysis with muscle injury, now resolved. CK levels were previously high, indicating muscle breakdown. Plan to recheck CK levels to ensure they are down trending. - Order CK level to assess muscle recovery     [facility] Suspecting likely RLS based on pattern of leg issues. - Assess iron panel.  I personally spent a total of 30 minutes in the care of the patient today including preparing to see the patient, getting/reviewing separately obtained history, performing a medically appropriate exam/evaluation, counseling and educating, placing orders, and documenting  clinical information in the EHR.   Return in about 6 months (around 12/16/2024) for Annual Physical.    Eilyn Polack T Dashon Mcintire, PA-C

## 2024-06-18 ENCOUNTER — Ambulatory Visit (HOSPITAL_BASED_OUTPATIENT_CLINIC_OR_DEPARTMENT_OTHER): Payer: Self-pay | Admitting: Student

## 2024-06-18 ENCOUNTER — Telehealth (HOSPITAL_BASED_OUTPATIENT_CLINIC_OR_DEPARTMENT_OTHER): Payer: Self-pay

## 2024-06-18 LAB — COMPREHENSIVE METABOLIC PANEL WITH GFR
ALT: 15 IU/L (ref 0–44)
AST: 15 IU/L (ref 0–40)
Albumin: 4.4 g/dL (ref 4.1–5.1)
Alkaline Phosphatase: 108 IU/L (ref 47–123)
BUN/Creatinine Ratio: 14 (ref 9–20)
BUN: 12 mg/dL (ref 6–24)
Bilirubin Total: <0.2 mg/dL (ref 0.0–1.2)
CO2: 18 mmol/L — ABNORMAL LOW (ref 20–29)
Calcium: 9.3 mg/dL (ref 8.7–10.2)
Chloride: 105 mmol/L (ref 96–106)
Creatinine, Ser: 0.83 mg/dL (ref 0.76–1.27)
Globulin, Total: 2.4 g/dL (ref 1.5–4.5)
Glucose: 116 mg/dL — ABNORMAL HIGH (ref 70–99)
Potassium: 4.2 mmol/L (ref 3.5–5.2)
Sodium: 140 mmol/L (ref 134–144)
Total Protein: 6.8 g/dL (ref 6.0–8.5)
eGFR: 113 mL/min/1.73 (ref >59)

## 2024-06-18 LAB — IRON,TIBC AND FERRITIN PANEL
Ferritin: 27 ng/mL — ABNORMAL LOW (ref 30–400)
Iron Saturation: 15 % (ref 15–55)
Iron: 48 ug/dL (ref 38–169)
Total Iron Binding Capacity: 323 ug/dL (ref 250–450)
UIBC: 275 ug/dL (ref 111–343)

## 2024-06-18 LAB — CK: Total CK: 102 U/L (ref 49–439)

## 2024-06-18 NOTE — Telephone Encounter (Signed)
 Lmtcb to see if he wants to cancel appt for tomorrow. He was here yesterday. Please try him again later or tomorrow morning.

## 2024-06-19 ENCOUNTER — Ambulatory Visit (HOSPITAL_BASED_OUTPATIENT_CLINIC_OR_DEPARTMENT_OTHER): Admitting: Student

## 2024-08-10 ENCOUNTER — Other Ambulatory Visit (HOSPITAL_BASED_OUTPATIENT_CLINIC_OR_DEPARTMENT_OTHER): Payer: Self-pay | Admitting: Student

## 2024-08-10 ENCOUNTER — Ambulatory Visit: Payer: Self-pay

## 2024-08-10 DIAGNOSIS — J410 Simple chronic bronchitis: Secondary | ICD-10-CM

## 2024-08-10 MED ORDER — OMEPRAZOLE 20 MG PO CPDR: DELAYED_RELEASE_CAPSULE | ORAL | 6 refills | Status: DC

## 2024-08-10 NOTE — Telephone Encounter (Signed)
 Copied from CRM #[id number]. Topic: Clinical - Medication Refill >> Aug 10, 2024 10:42 AM Thersia C wrote: Medication: albuterol  (VENTOLIN  HFA) 108 (90 Base) MCG/ACT inhaler\ omeprazole  (PRILOSEC) 20 MG capsule  Has the patient contacted their pharmacy? Yes (Agent: If no, request that the patient contact the pharmacy for the refill. If patient does not wish to contact the pharmacy document the reason why and proceed with request.) (Agent: If yes, when and what did the pharmacy advise?)  This is the patient's preferred pharmacy:  [id number], [place] - [address] AT [address] [place]  [address] Phone: [phone] Fax: [phone]   Is this the correct pharmacy for this prescription? Yes If no, delete pharmacy and type the correct one.   Has the prescription been filled recently? No  Is the patient out of the medication? Yes  Has the patient been seen for an appointment in the last year OR does the patient have an upcoming appointment? Yes  Can we respond through MyChart? Yes  Agent: Please be advised that Rx refills may take up to 3 business days. We ask that you follow-up with your pharmacy.

## 2024-08-10 NOTE — Telephone Encounter (Signed)
 FYI Only or Action Required?: FYI only for provider: appointment scheduled on 11/25.  Patient was last seen in primary care on 06/17/2024 by Rothfuss, Lang DASEN, PA-C.  Called Nurse Triage reporting Abdominal Pain.  Symptoms began today.  Symptoms are: unchanged.  Triage Disposition: See Physician Within 24 Hours  Patient/caregiver understands and will follow disposition?: Yes      Copied from CRM #[id number]. Topic: Clinical - Red Word Triage >> Aug 10, 2024  5:00 PM Joesph B wrote: [facility] that prompted transfer to Nurse Triage: Pain in stomach , foul smell coming out of his mouth.       Reason for Disposition  [1] MODERATE pain (e.g., interferes with normal activities) AND [2] pain comes and goes (cramps) AND [3] present > 24 hours  (Exception: Pain with Vomiting or Diarrhea - see that Guideline.)  Answer Assessment - Initial Assessment Questions 1. LOCATION: Where does it hurt?      Right sided abdomen  2. RADIATION: Does the pain shoot anywhere else? (e.g., chest, back)     Goes all over  3. ONSET: When did the pain begin? (Minutes, hours or days ago)      Today  4. SUDDEN: Gradual or sudden onset?     Sudden  5. PATTERN Does the pain come and go, or is it constant?     Intermittent  6. SEVERITY: How bad is the pain?  (e.g., Scale 1-10; mild, moderate, or severe)     Moderate  7. RECURRENT SYMPTOM: Have you ever had this type of stomach pain before? If Yes, ask: When was the last time? and What happened that time?      Yes 8. CAUSE: What do you think is causing the stomach pain? (e.g., gallstones, recent abdominal surgery)     Unsure  9. RELIEVING/AGGRAVATING FACTORS: What makes it better or worse? (e.g., antacids, bending or twisting motion, bowel movement)     Eating makes the pain worse, antacids improve the pain 10. OTHER SYMPTOMS: Do you have any other symptoms? (e.g., back pain, diarrhea, fever, urination pain, vomiting)       Foul taste  in mouth after belching  Protocols used: Abdominal Pain - Male-A-AH

## 2024-08-11 ENCOUNTER — Ambulatory Visit (INDEPENDENT_AMBULATORY_CARE_PROVIDER_SITE_OTHER): Admitting: Student

## 2024-08-11 ENCOUNTER — Other Ambulatory Visit (HOSPITAL_BASED_OUTPATIENT_CLINIC_OR_DEPARTMENT_OTHER): Payer: Self-pay

## 2024-08-11 ENCOUNTER — Encounter (HOSPITAL_BASED_OUTPATIENT_CLINIC_OR_DEPARTMENT_OTHER): Payer: Self-pay | Admitting: Student

## 2024-08-11 VITALS — BP 90/64 | HR 92 | Temp 97.6°F | Resp 16 | Ht 69.49 in | Wt 229.6 lb

## 2024-08-11 DIAGNOSIS — K219 Gastro-esophageal reflux disease without esophagitis: Secondary | ICD-10-CM | POA: Diagnosis not present

## 2024-08-11 DIAGNOSIS — K8050 Calculus of bile duct without cholangitis or cholecystitis without obstruction: Secondary | ICD-10-CM

## 2024-08-11 MED ORDER — OMEPRAZOLE 40 MG PO CPDR
40.0000 mg | DELAYED_RELEASE_CAPSULE | Freq: Every day | ORAL | 6 refills | Status: DC
  Filled 2024-08-11: qty 30, 30d supply, fill #0

## 2024-08-11 NOTE — Progress Notes (Signed)
 Acute Office Visit  Subjective:     Patient ID: John [name], male    DOB: 1981/04/28, 43 y.o.   MRN: [id number]  Chief Complaint  Patient presents with   GI Problem    Pt reports moderate pain that comes and goes. Cramping. Having cold sweats. Burping a lot and it smells like feces. When he was prison had a hernia surgery and is not sure if there was a complication. Has had a migraine for 2 days. Pt states omeprazole  helps stomach aching. Was referral to a GI doctor in [place] but never showed up due to feeling better.    HPI  Discussed the use of AI scribe software for clinical note transcription with the patient, who gave verbal consent to proceed.  History of Present Illness   John [name] is a 43 year old male who presents with abdominal pain and bloating.  He experiences abdominal pain similar to previous episodes that required hospitalization. The pain is severe, associated with bloating and tightness in the abdomen, and worsens with pressure. He describes a sensation of 'stuff' being pushed up toward his throat.  He has a history of surgery on his large intestines as a baby and hernia surgery while in prison. He recalls a previous hospital visit where imaging suggested a possible finding, but he did not follow up as his symptoms improved.  He experiences bad breath, and attributes it to possible reflux. He has not been taking his heartburn medication recently. He has had two episodes of vomiting, describing the vomitus as green, but denies any blood or stool-like appearance.  He has regular bowel movements, about two to three times a day, with occasional changes in stool color between green and brown. He reports that his stools have been watery.  He has a history of gallbladder issues and mentions that he was advised to have it removed during a previous hospital visit, but the procedure was not performed as his condition improved.  He has been experiencing cold sweats and a  persistent migraine for the past two days. He has taken ibuprofen  twice for the migraine.  He feels bloated and describes his stomach as feeling like it is 'turning' and 'bubbling', making it difficult for him to sit still.      ROS Per HPI     Objective:    BP 90/64   Pulse 92   Temp 97.6 F (36.4 C) (Oral)   Resp 16   Ht 5' 9.49 (1.765 m)   Wt 229 lb 9.6 oz (104.1 kg)   SpO2 95%   BMI 33.43 kg/m  BP Readings from Last 3 Encounters:  08/11/24 90/64  06/17/24 131/82  06/01/24 114/73   Wt Readings from Last 3 Encounters:  08/11/24 229 lb 9.6 oz (104.1 kg)  06/17/24 222 lb 4.8 oz (100.8 kg)  05/20/24 227 lb 4.8 oz (103.1 kg)   SpO2 Readings from Last 3 Encounters:  08/11/24 95%  06/17/24 95%  06/01/24 95%      Physical Exam Constitutional:      General: He is in acute distress.     Appearance: Normal appearance. He is not ill-appearing or toxic-appearing.  HENT:     Head: Normocephalic and atraumatic.     Right Ear: External ear normal.     Left Ear: External ear normal.     Nose: Nose normal.  Eyes:     Conjunctiva/sclera: Conjunctivae normal.  Cardiovascular:     Rate and Rhythm: Normal rate and regular  rhythm.     Pulses: Normal pulses.     Heart sounds: Normal heart sounds. No murmur heard.    No friction rub.  Pulmonary:     Effort: Pulmonary effort is normal. No respiratory distress.     Breath sounds: Normal breath sounds. No wheezing, rhonchi or rales.  Abdominal:     General: Bowel sounds are normal. There is no distension.     Palpations: There is no mass.     Tenderness: There is abdominal tenderness in the right upper quadrant. There is guarding. There is no rebound. Positive signs include Murphy's sign and Rovsing's sign. Negative signs include McBurney's sign.     Hernia: No hernia is present.  Skin:    General: Skin is warm and dry.     Coloration: Skin is not jaundiced or pale.  Neurological:     Mental Status: He is alert.   Psychiatric:        Mood and Affect: Mood normal.        Behavior: Behavior normal.     No results found for any visits on 08/11/24.      Assessment & Plan:   Assessment and Plan    Biliary colic Recurrent abdominal pain with positive Murphy sign, suggestive of biliary colic. History of gallbladder issues without cholecystectomy. Differential includes gallbladder pathology versus stomach/duodenal ulceration. Symptoms include bloating, abdominal tightness, and pain exacerbated by pressure. No fever, but cold sweats present. Previous hospitalization for similar symptoms, but gallbladder not removed. - Ordered stat ultrasound of gallbladder - Ordered stat CBC to rule out infection - Advised to go to ER if fever, worsening abdominal pain, or systemic symptoms develop  Gastroesophageal reflux disease and dyspepsia with abdominal pain Abdominal pain with dyspepsia, may be related to GERD. Symptoms include bad breath, vomiting green material, and alleviation with antiacid med. No current use of heartburn medication. Previous use of ibuprofen , which is contraindicated due to potential GI tract irritation. - Prescribed higher dose of omeprazole  (Prilosec) daily - Advised against use of NSAIDs like ibuprofen ; recommended Tylenol  for pain management      Return if symptoms worsen or fail to improve.  Celina Shiley T Callan Yontz, PA-C

## 2024-08-11 NOTE — Patient Instructions (Signed)
 It was nice to see you today!  If you have any problems before your next visit feel free to message me via MyChart (minor issues or questions) or call the office, otherwise you may reach out to schedule an office visit.  Thank you! Pau Banh, PA-C

## 2024-08-24 ENCOUNTER — Ambulatory Visit (INDEPENDENT_AMBULATORY_CARE_PROVIDER_SITE_OTHER)
Admission: RE | Admit: 2024-08-24 | Discharge: 2024-08-24 | Disposition: A | Source: Ambulatory Visit | Attending: Student | Admitting: Radiology

## 2024-08-24 ENCOUNTER — Ambulatory Visit (HOSPITAL_BASED_OUTPATIENT_CLINIC_OR_DEPARTMENT_OTHER): Payer: Self-pay | Admitting: Student

## 2024-08-24 DIAGNOSIS — K8050 Calculus of bile duct without cholangitis or cholecystitis without obstruction: Secondary | ICD-10-CM

## 2024-08-25 ENCOUNTER — Other Ambulatory Visit (HOSPITAL_BASED_OUTPATIENT_CLINIC_OR_DEPARTMENT_OTHER): Payer: Self-pay

## 2024-08-27 ENCOUNTER — Telehealth (HOSPITAL_BASED_OUTPATIENT_CLINIC_OR_DEPARTMENT_OTHER): Payer: Self-pay | Admitting: Student

## 2024-08-27 NOTE — Telephone Encounter (Signed)
 Left voicemail on wife's VM advising US  was normal and to call back if pt still feels bad.

## 2024-08-27 NOTE — Telephone Encounter (Signed)
 Copied from CRM #[id number]. Topic: Clinical - Lab/Test Results >> Aug 27, 2024  9:39 AM Treva T wrote: Reason for CRM: Pt spouse, Katheryn, calling to discuss ultrasound results on behalf of pt.  Pt spouse is aware of same day call back.  Can be reached at [phone]

## 2024-09-01 ENCOUNTER — Telehealth (HOSPITAL_BASED_OUTPATIENT_CLINIC_OR_DEPARTMENT_OTHER): Payer: Self-pay | Admitting: Student

## 2024-09-01 NOTE — Telephone Encounter (Signed)
 Copied from CRM #[id number]. Topic: General - Deceased Patient >> 2024/09/02 12:40 PM Leonette SQUIBB wrote: Name of caller: Katheryn  Date of death: 2024-08-31   Name of funeral home: none doing autopsy  Phone number of funeral home: [phone]  Provider that needs to sign form: Lang Rothfuss  Timeline for signing: dreama

## 2024-09-17 DEATH — deceased

## 2024-12-16 ENCOUNTER — Encounter (HOSPITAL_BASED_OUTPATIENT_CLINIC_OR_DEPARTMENT_OTHER): Admitting: Student
